# Patient Record
Sex: Female | Born: 1945
Health system: Southern US, Community
[De-identification: ages and names within clinical notes are randomized; demographics above are authoritative.]

## PROBLEM LIST (undated history)

## (undated) DIAGNOSIS — I872 Venous insufficiency (chronic) (peripheral): Secondary | ICD-10-CM

## (undated) DIAGNOSIS — I8390 Asymptomatic varicose veins of unspecified lower extremity: Secondary | ICD-10-CM

## (undated) DIAGNOSIS — J449 Chronic obstructive pulmonary disease, unspecified: Secondary | ICD-10-CM

## (undated) DIAGNOSIS — K219 Gastro-esophageal reflux disease without esophagitis: Secondary | ICD-10-CM

## (undated) DIAGNOSIS — G40909 Epilepsy, unspecified, not intractable, without status epilepticus: Secondary | ICD-10-CM

## (undated) DIAGNOSIS — M1990 Unspecified osteoarthritis, unspecified site: Secondary | ICD-10-CM

## (undated) DIAGNOSIS — G4730 Sleep apnea, unspecified: Secondary | ICD-10-CM

## (undated) DIAGNOSIS — E785 Hyperlipidemia, unspecified: Secondary | ICD-10-CM

## (undated) DIAGNOSIS — K649 Unspecified hemorrhoids: Secondary | ICD-10-CM

## (undated) DIAGNOSIS — G8929 Other chronic pain: Secondary | ICD-10-CM

## (undated) DIAGNOSIS — R519 Headache, unspecified: Secondary | ICD-10-CM

## (undated) DIAGNOSIS — I509 Heart failure, unspecified: Secondary | ICD-10-CM

## (undated) DIAGNOSIS — R0600 Dyspnea, unspecified: Secondary | ICD-10-CM

## (undated) DIAGNOSIS — R7302 Impaired glucose tolerance (oral): Secondary | ICD-10-CM

## (undated) DIAGNOSIS — J45909 Unspecified asthma, uncomplicated: Secondary | ICD-10-CM

## (undated) DIAGNOSIS — R51 Headache: Secondary | ICD-10-CM

## (undated) DIAGNOSIS — E039 Hypothyroidism, unspecified: Secondary | ICD-10-CM

## (undated) DIAGNOSIS — R569 Unspecified convulsions: Secondary | ICD-10-CM

## (undated) HISTORY — DX: Impaired glucose tolerance (oral): R73.02

## (undated) HISTORY — DX: Venous insufficiency (chronic) (peripheral): I87.2

## (undated) HISTORY — DX: Other chronic pain: G89.29

## (undated) HISTORY — DX: Headache: R51

## (undated) HISTORY — DX: Unspecified convulsions: R56.9

## (undated) HISTORY — DX: Gastro-esophageal reflux disease without esophagitis: K21.9

## (undated) HISTORY — PX: OTHER SURGICAL HISTORY: SHX169

## (undated) HISTORY — DX: Unspecified hemorrhoids: K64.9

## (undated) HISTORY — PX: TUBAL LIGATION: SHX77

## (undated) HISTORY — DX: Headache, unspecified: R51.9

## (undated) HISTORY — DX: Hyperlipidemia, unspecified: E78.5

## (undated) HISTORY — DX: Epilepsy, unspecified, not intractable, without status epilepticus: G40.909

## (undated) HISTORY — PX: KNEE ARTHROSCOPY: SHX127

## (undated) HISTORY — PX: TONSILLECTOMY: SUR1361

## (undated) HISTORY — DX: Asymptomatic varicose veins of unspecified lower extremity: I83.90

---

## 2000-11-01 ENCOUNTER — Ambulatory Visit (HOSPITAL_COMMUNITY): Admission: RE | Admit: 2000-11-01 | Discharge: 2000-11-01 | Payer: Self-pay | Admitting: Family Medicine

## 2000-11-01 ENCOUNTER — Encounter: Payer: Self-pay | Admitting: Family Medicine

## 2001-10-22 ENCOUNTER — Encounter: Payer: Self-pay | Admitting: Internal Medicine

## 2001-10-22 ENCOUNTER — Ambulatory Visit (HOSPITAL_COMMUNITY): Admission: RE | Admit: 2001-10-22 | Discharge: 2001-10-22 | Payer: Self-pay | Admitting: Internal Medicine

## 2007-03-01 ENCOUNTER — Ambulatory Visit: Payer: Self-pay | Admitting: Cardiology

## 2007-03-04 ENCOUNTER — Encounter: Payer: Self-pay | Admitting: Cardiology

## 2008-08-23 ENCOUNTER — Encounter: Payer: Self-pay | Admitting: Cardiology

## 2008-08-27 ENCOUNTER — Encounter: Payer: Self-pay | Admitting: Cardiology

## 2008-10-23 ENCOUNTER — Encounter: Payer: Self-pay | Admitting: Cardiology

## 2008-12-17 ENCOUNTER — Encounter: Payer: Self-pay | Admitting: Cardiology

## 2008-12-25 ENCOUNTER — Encounter: Payer: Self-pay | Admitting: Cardiology

## 2009-01-06 ENCOUNTER — Encounter (INDEPENDENT_AMBULATORY_CARE_PROVIDER_SITE_OTHER): Payer: Self-pay | Admitting: *Deleted

## 2009-01-06 ENCOUNTER — Ambulatory Visit: Payer: Self-pay | Admitting: Cardiology

## 2009-01-06 DIAGNOSIS — G4710 Hypersomnia, unspecified: Secondary | ICD-10-CM | POA: Insufficient documentation

## 2009-01-06 DIAGNOSIS — R569 Unspecified convulsions: Secondary | ICD-10-CM

## 2009-01-06 DIAGNOSIS — I10 Essential (primary) hypertension: Secondary | ICD-10-CM | POA: Insufficient documentation

## 2009-01-06 DIAGNOSIS — F411 Generalized anxiety disorder: Secondary | ICD-10-CM | POA: Insufficient documentation

## 2009-01-06 DIAGNOSIS — R0789 Other chest pain: Secondary | ICD-10-CM | POA: Insufficient documentation

## 2009-01-06 DIAGNOSIS — E663 Overweight: Secondary | ICD-10-CM | POA: Insufficient documentation

## 2009-01-06 DIAGNOSIS — G4730 Sleep apnea, unspecified: Secondary | ICD-10-CM | POA: Insufficient documentation

## 2009-01-06 DIAGNOSIS — K219 Gastro-esophageal reflux disease without esophagitis: Secondary | ICD-10-CM

## 2009-01-26 ENCOUNTER — Telehealth (INDEPENDENT_AMBULATORY_CARE_PROVIDER_SITE_OTHER): Payer: Self-pay | Admitting: *Deleted

## 2009-02-01 ENCOUNTER — Ambulatory Visit: Payer: Self-pay | Admitting: Cardiology

## 2009-02-01 ENCOUNTER — Encounter: Payer: Self-pay | Admitting: Cardiology

## 2009-02-09 ENCOUNTER — Encounter (INDEPENDENT_AMBULATORY_CARE_PROVIDER_SITE_OTHER): Payer: Self-pay | Admitting: *Deleted

## 2010-09-07 ENCOUNTER — Encounter: Payer: Self-pay | Admitting: Cardiology

## 2015-02-12 ENCOUNTER — Ambulatory Visit (INDEPENDENT_AMBULATORY_CARE_PROVIDER_SITE_OTHER): Payer: Medicare Other | Admitting: Obstetrics and Gynecology

## 2015-02-12 ENCOUNTER — Encounter: Payer: Self-pay | Admitting: Obstetrics and Gynecology

## 2015-02-12 VITALS — BP 140/78 | Ht 65.0 in | Wt 269.0 lb

## 2015-02-12 DIAGNOSIS — N764 Abscess of vulva: Secondary | ICD-10-CM | POA: Insufficient documentation

## 2015-02-12 NOTE — Progress Notes (Signed)
Patient ID: Isabella Holmes, female   DOB: [birth year], 69 y.o.   MRN: [id number] Pt worked in today for bartholin's cyst. Pt states that she has had the problem for about 2 days.

## 2015-02-12 NOTE — Progress Notes (Addendum)
Patient ID: OFILIA WOLFINGER, female   DOB: 1945/07/30, 69 y.o.   MRN: [id number]   [facility] ObGyn Clinic Visit  Patient name: MARYKE KO MRN [id number]  Date of birth: 07/02/1945  CC & HPI:  JESLIN KENTER is a 69 y.o. female presenting today for an abscess in the left gluteal crease with associated  burning pain onset couple of days ago. Pt reports an allergy to penicillin and sulfa.   ROS:  10 Systems reviewed and all are negative for acute change except as noted in the HPI.no hx diabetes, or poor healing. Has had folliculitis in past Pertinent History Reviewed:   Reviewed: Significant for status post bilateral tubaligation.  Medical         Past Medical History  Diagnosis Date  . Seizure disorder (HCC)   . Chest pain     w abnormal ECg  . Venous insufficiency   . Dyslipidemia   . Glucose intolerance (impaired glucose tolerance)   . GERD (gastroesophageal reflux disease)   . Chronic headaches     migraine                              Surgical Hx:    Past Surgical History  Procedure Laterality Date  . Tonsillectomy      many years ago  . Left vein stripping    . Status post bilateral tubaligation     Medications: Reviewed & Updated - see associated section                       Current outpatient prescriptions:  .  aspirin 81 MG tablet, Take 81 mg by mouth daily.  , Disp: , Rfl:  .  ergocalciferol (VITAMIN D2) 50000 UNITS capsule, Take 50,000 Units by mouth once a week.  , Disp: , Rfl:  .  fexofenadine (ALLEGRA) 180 MG tablet, Take 180 mg by mouth daily.  , Disp: , Rfl:  .  lamoTRIgine (LAMICTAL) 150 MG tablet, Take 150 mg by mouth daily.  , Disp: , Rfl:  .  levothyroxine (SYNTHROID, LEVOTHROID) 50 MCG tablet, Take 50 mcg by mouth daily., Disp: , Rfl: 0 .  Omega-3 Fatty Acids (FISH OIL) 1000 MG CAPS, Take by mouth daily.  , Disp: , Rfl:  .  nitroGLYCERIN (NITROSTAT) 0.4 MG SL tablet, Place 0.4 mg under the tongue every 5 (five) minutes as needed.  , Disp: , Rfl:     Social History: Reviewed -  reports that she has quit smoking. She has never used smokeless tobacco.  Objective Findings:  Vitals: Blood pressure 140/78, height 5\' 5"  (1.651 m), weight 269 lb (122.018 kg).  Physical Examination: General appearance - alert, well appearing, and in no distress and oriented to person, place, and time Mental status - alert, oriented to person, place, and time, normal mood, behavior, speech, dress, motor activity, and thought processes Pelvic - normal external genitalia, vulva, vagina, cervix, uterus and adnexa,  VULVA: normal appearing vulva with no masses, tenderness or lesions,  VAGINA: normal appearing vagina with normal color and discharge, Hard nodule in left gluteal crease directly lateral to the vagina, approximately 1.5cm in diameter.    Assessment & Plan:   A:  1. vulvar folliculitis, left gluteal crease   P:  1. Antibiotics (doxycycline 14 days).  2. Heating Pad use.  3. Return if no improvement within one week for possible lancing.   By  signing my name below, I, Marica Otter, attest that this documentation has been prepared under the direction and in the presence of Christin Bach, MD. Electronically Signed: Marica Otter, ED Scribe. 02/12/2015. 1:34 PM.  I personally performed the services described in this documentation, which was SCRIBED in my presence. The recorded information has been reviewed and considered accurate. It has been edited as necessary during review. Tilda Burrow, MD

## 2015-02-16 ENCOUNTER — Telehealth: Payer: Self-pay | Admitting: Obstetrics and Gynecology

## 2015-02-16 NOTE — Telephone Encounter (Signed)
Spoke with Dr. Glo Herring and he advised for the Isabella Holmes to continue taking the antibiotic. If Isabella Holmes's thigh or groin area is swelling and hurting this is normal.

## 2015-02-24 ENCOUNTER — Other Ambulatory Visit: Payer: Medicare Other | Admitting: Adult Health

## 2015-03-01 ENCOUNTER — Ambulatory Visit: Payer: Medicare Other | Admitting: Obstetrics and Gynecology

## 2015-03-03 ENCOUNTER — Encounter: Payer: Self-pay | Admitting: Obstetrics and Gynecology

## 2015-03-03 ENCOUNTER — Other Ambulatory Visit: Payer: Self-pay | Admitting: Obstetrics and Gynecology

## 2015-03-03 ENCOUNTER — Ambulatory Visit (INDEPENDENT_AMBULATORY_CARE_PROVIDER_SITE_OTHER): Payer: Medicare Other | Admitting: Obstetrics and Gynecology

## 2015-03-03 VITALS — BP 146/80 | Ht 66.0 in | Wt 269.0 lb

## 2015-03-03 DIAGNOSIS — N764 Abscess of vulva: Secondary | ICD-10-CM

## 2015-03-03 NOTE — Progress Notes (Signed)
Patient ID: Isabella Holmes, female   DOB: 01/23/1946, 69 y.o.   MRN: [id number] Pt here today for follow up. Pt states that the area is better but still there. Pt denies any pain at this time.

## 2015-03-03 NOTE — Progress Notes (Signed)
Patient ID: Isabella Holmes, female   DOB: 01/05/1946, 69 y.o.   MRN: [id number]    [facility] ObGyn Clinic Visit  Patient name: Isabella Holmes MRN [id number]  Date of birth: 07-Jun-1945  CC & HPI:  Isabella Holmes is a 69 y.o. female presenting today for follow up of vulvar folliculitis. Pt reports healing well after 14 days of doxycycline and warm compress with minimal tenderness.   ROS:  A complete 10 system review of systems was obtained and all systems are negative except as noted in the HPI and PMH.    Pertinent History Reviewed:   Reviewed: Significant for vulvar folliculitis  Medical         Past Medical History  Diagnosis Date  . Seizure disorder (HCC)   . Chest pain     w abnormal ECg  . Venous insufficiency   . Dyslipidemia   . Glucose intolerance (impaired glucose tolerance)   . GERD (gastroesophageal reflux disease)   . Chronic headaches     migraine                              Surgical Hx:    Past Surgical History  Procedure Laterality Date  . Tonsillectomy      many years ago  . Left vein stripping    . Status post bilateral tubaligation     Medications: Reviewed & Updated - see associated section                       Current outpatient prescriptions:  .  aspirin 81 MG tablet, Take 81 mg by mouth daily.  , Disp: , Rfl:  .  ergocalciferol (VITAMIN D2) 50000 UNITS capsule, Take 50,000 Units by mouth once a week.  , Disp: , Rfl:  .  fexofenadine (ALLEGRA) 180 MG tablet, Take 180 mg by mouth daily.  , Disp: , Rfl:  .  lamoTRIgine (LAMICTAL) 150 MG tablet, Take 150 mg by mouth daily.  , Disp: , Rfl:  .  levothyroxine (SYNTHROID, LEVOTHROID) 50 MCG tablet, Take 50 mcg by mouth daily., Disp: , Rfl: 0 .  nitroGLYCERIN (NITROSTAT) 0.4 MG SL tablet, Place 0.4 mg under the tongue every 5 (five) minutes as needed.  , Disp: , Rfl:  .  Omega-3 Fatty Acids (FISH OIL) 1000 MG CAPS, Take by mouth daily.  , Disp: , Rfl:    Social History: Reviewed -  reports that she has  quit smoking. She has never used smokeless tobacco.  Objective Findings:  Vitals: Blood pressure 146/80, height 5\' 6"  (1.676 m), weight 269 lb (122.018 kg).  Physical Examination: General appearance - alert, well appearing, and in no distress Mental status - alert, oriented to person, place, and time Pelvic - normal external genitalia, vulva, vagina, cervix, uterus and adnexa, Left inguinal crease: she had a previous folliculitis that was indurated. It is now reduced to 1x1 cm; mobile; no redness or tenderness.    Assessment & Plan:   A:  1. Well healing folliculitis with no erythema or tenderness.  2. Reduced to 1x1 cm from 2cm.   P:  1. Follow up prn or with worsening symptoms.      By signing my name below, I, Doreatha Martin, attest that this documentation has been prepared under the direction and in the presence of Tilda Burrow, MD. Electronically Signed: Doreatha Martin, ED Scribe. 03/03/2015. 9:39 AM.  I  personally performed the services described in this documentation, which was SCRIBED in my presence. The recorded information has been reviewed and considered accurate. It has been edited as necessary during review. Tilda Burrow, MD

## 2015-03-25 ENCOUNTER — Ambulatory Visit (INDEPENDENT_AMBULATORY_CARE_PROVIDER_SITE_OTHER): Payer: Medicare Other | Admitting: Adult Health

## 2015-03-25 ENCOUNTER — Other Ambulatory Visit (HOSPITAL_COMMUNITY)
Admission: RE | Admit: 2015-03-25 | Discharge: 2015-03-25 | Disposition: A | Discharge status: Home / Self Care | Payer: Medicare Other | Source: Ambulatory Visit | Attending: Adult Health | Admitting: Adult Health

## 2015-03-25 ENCOUNTER — Encounter: Payer: Self-pay | Admitting: Adult Health

## 2015-03-25 VITALS — BP 140/78 | HR 72 | Ht 64.0 in | Wt 270.0 lb

## 2015-03-25 DIAGNOSIS — Z01419 Encounter for gynecological examination (general) (routine) without abnormal findings: Secondary | ICD-10-CM

## 2015-03-25 DIAGNOSIS — Z1212 Encounter for screening for malignant neoplasm of rectum: Secondary | ICD-10-CM | POA: Diagnosis not present

## 2015-03-25 DIAGNOSIS — I8390 Asymptomatic varicose veins of unspecified lower extremity: Secondary | ICD-10-CM

## 2015-03-25 DIAGNOSIS — Z1151 Encounter for screening for human papillomavirus (HPV): Secondary | ICD-10-CM | POA: Insufficient documentation

## 2015-03-25 DIAGNOSIS — K649 Unspecified hemorrhoids: Secondary | ICD-10-CM | POA: Insufficient documentation

## 2015-03-25 HISTORY — DX: Asymptomatic varicose veins of unspecified lower extremity: I83.90

## 2015-03-25 HISTORY — DX: Unspecified hemorrhoids: K64.9

## 2015-03-25 LAB — HEMOCCULT GUIAC POC 1CARD (OFFICE): FECAL OCCULT BLD: NEGATIVE

## 2015-03-25 NOTE — Progress Notes (Signed)
Patient ID: MARYHELEN MCELENEY, female   DOB: April 02, 1945, 70 y.o.   MRN: [id number] History of Present Illness: Oliana is a 70 year old white female, married in for a well woman gyn exam and pap.She has swelling on left leg at times up to groin, she has had veins stripped in that leg in past. Declines flu shot. PCP is Dr Zada Girt.  Current Medications, Allergies, Past Medical History, Past Surgical History, Family History and Social History were reviewed in [employer] record.     Review of Systems: Patient denies any headaches, hearing loss, fatigue, blurred vision, shortness of breath, chest pain, abdominal pain, problems with bowel movements, urination, or intercourse(not having sex). No joint pain or mood swings.    Physical Exam:BP 140/78 mmHg  Pulse 72  Ht 5\' 4"  (1.626 m)  Wt 270 lb (122.471 kg)  BMI 46.32 kg/m2 General:  Well developed, well nourished, no acute distress Skin:  Warm and dry Neck:  Midline trachea, normal thyroid, good ROM, no lymphadenopathy, no carotids heard Lungs; Clear to auscultation bilaterally Breast:  No dominant palpable mass, retraction, or nipple discharge Cardiovascular: Regular rate and rhythm Abdomen:  Soft, non tender, no hepatosplenomegaly,obese Pelvic:  External genitalia is normal in appearance, no lesions.  The vagina has decreased color, moisture and rugae. Urethra has no lesions or masses. The cervix is smooth, pap with HPV performed.  Uterus is felt to be normal size, shape, and contour.  No adnexal masses or tenderness noted.Bladder is non tender, no masses felt, difficult secondary to abdominal girth  Rectal: Good sphincter tone, no polyps, +internal and external  hemorrhoids felt.  Hemoccult negative. Extremities/musculoskeletal:  No swelling, today, has + varicosities noted in both legs, L>R, no clubbing or cyanosis, color, normal and temperature normal, no hot spots or redness, discussed probable insuffiencey  Psych:  No  mood changes, alert and cooperative,seems happy   Impression: Well woman gyn exam and pap Varicose veins Hemorrhoids     Plan: Physical in 2 years. Pap in 3 if normal Mammogram yearly Labs with PCP Has F/U with PCP end of month, talk with him about getting doppler left leg to assess veins Try analpram HC for hemorrhoids, 3 samples given, if works, can [employer]

## 2015-03-25 NOTE — Patient Instructions (Addendum)
Physical in 2 years Mammogram yearly Follow up with PCP about leg  Labs with PCP

## 2015-03-30 LAB — CYTOLOGY - PAP

## 2015-04-02 DIAGNOSIS — G40909 Epilepsy, unspecified, not intractable, without status epilepticus: Secondary | ICD-10-CM | POA: Diagnosis not present

## 2015-04-02 DIAGNOSIS — Z6841 Body Mass Index (BMI) 40.0 and over, adult: Secondary | ICD-10-CM | POA: Diagnosis not present

## 2015-04-02 DIAGNOSIS — G4733 Obstructive sleep apnea (adult) (pediatric): Secondary | ICD-10-CM | POA: Diagnosis not present

## 2015-04-02 DIAGNOSIS — Z0000 Encounter for general adult medical examination without abnormal findings: Secondary | ICD-10-CM | POA: Diagnosis not present

## 2015-04-02 DIAGNOSIS — E038 Other specified hypothyroidism: Secondary | ICD-10-CM | POA: Diagnosis not present

## 2015-04-02 DIAGNOSIS — J4520 Mild intermittent asthma, uncomplicated: Secondary | ICD-10-CM | POA: Diagnosis not present

## 2015-04-02 DIAGNOSIS — I10 Essential (primary) hypertension: Secondary | ICD-10-CM | POA: Diagnosis not present

## 2015-04-12 DIAGNOSIS — H1851 Endothelial corneal dystrophy: Secondary | ICD-10-CM | POA: Diagnosis not present

## 2015-04-12 DIAGNOSIS — H52223 Regular astigmatism, bilateral: Secondary | ICD-10-CM | POA: Diagnosis not present

## 2015-04-12 DIAGNOSIS — H2513 Age-related nuclear cataract, bilateral: Secondary | ICD-10-CM | POA: Diagnosis not present

## 2015-04-12 DIAGNOSIS — H5203 Hypermetropia, bilateral: Secondary | ICD-10-CM | POA: Diagnosis not present

## 2015-04-15 DIAGNOSIS — G4733 Obstructive sleep apnea (adult) (pediatric): Secondary | ICD-10-CM | POA: Diagnosis not present

## 2015-05-13 DIAGNOSIS — G4733 Obstructive sleep apnea (adult) (pediatric): Secondary | ICD-10-CM | POA: Diagnosis not present

## 2015-06-08 DIAGNOSIS — G4733 Obstructive sleep apnea (adult) (pediatric): Secondary | ICD-10-CM | POA: Diagnosis not present

## 2015-06-10 DIAGNOSIS — G4733 Obstructive sleep apnea (adult) (pediatric): Secondary | ICD-10-CM | POA: Diagnosis not present

## 2015-06-13 DIAGNOSIS — G4733 Obstructive sleep apnea (adult) (pediatric): Secondary | ICD-10-CM | POA: Diagnosis not present

## 2015-07-02 DIAGNOSIS — E038 Other specified hypothyroidism: Secondary | ICD-10-CM | POA: Diagnosis not present

## 2015-07-02 DIAGNOSIS — Z0000 Encounter for general adult medical examination without abnormal findings: Secondary | ICD-10-CM | POA: Diagnosis not present

## 2015-07-02 DIAGNOSIS — I10 Essential (primary) hypertension: Secondary | ICD-10-CM | POA: Diagnosis not present

## 2015-07-10 DIAGNOSIS — G4733 Obstructive sleep apnea (adult) (pediatric): Secondary | ICD-10-CM | POA: Diagnosis not present

## 2015-07-13 DIAGNOSIS — G4733 Obstructive sleep apnea (adult) (pediatric): Secondary | ICD-10-CM | POA: Diagnosis not present

## 2015-08-10 DIAGNOSIS — G4733 Obstructive sleep apnea (adult) (pediatric): Secondary | ICD-10-CM | POA: Diagnosis not present

## 2015-08-13 DIAGNOSIS — G4733 Obstructive sleep apnea (adult) (pediatric): Secondary | ICD-10-CM | POA: Diagnosis not present

## 2015-09-10 DIAGNOSIS — G4733 Obstructive sleep apnea (adult) (pediatric): Secondary | ICD-10-CM | POA: Diagnosis not present

## 2015-09-12 DIAGNOSIS — G4733 Obstructive sleep apnea (adult) (pediatric): Secondary | ICD-10-CM | POA: Diagnosis not present

## 2015-10-11 DIAGNOSIS — G4733 Obstructive sleep apnea (adult) (pediatric): Secondary | ICD-10-CM | POA: Diagnosis not present

## 2015-10-13 DIAGNOSIS — G4733 Obstructive sleep apnea (adult) (pediatric): Secondary | ICD-10-CM | POA: Diagnosis not present

## 2015-11-09 DIAGNOSIS — M1611 Unilateral primary osteoarthritis, right hip: Secondary | ICD-10-CM | POA: Diagnosis not present

## 2015-11-09 DIAGNOSIS — M160 Bilateral primary osteoarthritis of hip: Secondary | ICD-10-CM | POA: Diagnosis not present

## 2015-11-09 DIAGNOSIS — E038 Other specified hypothyroidism: Secondary | ICD-10-CM | POA: Diagnosis not present

## 2015-11-09 DIAGNOSIS — I10 Essential (primary) hypertension: Secondary | ICD-10-CM | POA: Diagnosis not present

## 2015-11-10 DIAGNOSIS — G4733 Obstructive sleep apnea (adult) (pediatric): Secondary | ICD-10-CM | POA: Diagnosis not present

## 2015-11-13 DIAGNOSIS — G4733 Obstructive sleep apnea (adult) (pediatric): Secondary | ICD-10-CM | POA: Diagnosis not present

## 2015-12-11 DIAGNOSIS — G4733 Obstructive sleep apnea (adult) (pediatric): Secondary | ICD-10-CM | POA: Diagnosis not present

## 2015-12-13 DIAGNOSIS — G4733 Obstructive sleep apnea (adult) (pediatric): Secondary | ICD-10-CM | POA: Diagnosis not present

## 2016-01-05 DIAGNOSIS — G4733 Obstructive sleep apnea (adult) (pediatric): Secondary | ICD-10-CM | POA: Diagnosis not present

## 2016-01-05 DIAGNOSIS — Z833 Family history of diabetes mellitus: Secondary | ICD-10-CM | POA: Diagnosis not present

## 2016-01-05 DIAGNOSIS — Z13220 Encounter for screening for lipoid disorders: Secondary | ICD-10-CM | POA: Diagnosis not present

## 2016-01-05 DIAGNOSIS — E039 Hypothyroidism, unspecified: Secondary | ICD-10-CM | POA: Diagnosis not present

## 2016-01-05 DIAGNOSIS — Z131 Encounter for screening for diabetes mellitus: Secondary | ICD-10-CM | POA: Diagnosis not present

## 2016-01-05 DIAGNOSIS — Z23 Encounter for immunization: Secondary | ICD-10-CM | POA: Diagnosis not present

## 2016-01-11 DIAGNOSIS — G4733 Obstructive sleep apnea (adult) (pediatric): Secondary | ICD-10-CM | POA: Diagnosis not present

## 2016-01-13 DIAGNOSIS — G4733 Obstructive sleep apnea (adult) (pediatric): Secondary | ICD-10-CM | POA: Diagnosis not present

## 2016-01-17 DIAGNOSIS — Z1231 Encounter for screening mammogram for malignant neoplasm of breast: Secondary | ICD-10-CM | POA: Diagnosis not present

## 2016-02-10 ENCOUNTER — Telehealth: Payer: Self-pay | Admitting: Obstetrics and Gynecology

## 2016-02-10 DIAGNOSIS — G4733 Obstructive sleep apnea (adult) (pediatric): Secondary | ICD-10-CM | POA: Diagnosis not present

## 2016-02-10 MED ORDER — DOXYCYCLINE HYCLATE 100 MG PO TABS: 100.0000 mg | ORAL_TABLET | Freq: Two times a day (BID) | ORAL | 0 refills | Status: DC

## 2016-02-10 NOTE — Telephone Encounter (Signed)
Doxycycline e-scribed, if no improvement with ABX pt will need to make an appt.

## 2016-02-12 DIAGNOSIS — G4733 Obstructive sleep apnea (adult) (pediatric): Secondary | ICD-10-CM | POA: Diagnosis not present

## 2016-02-29 DIAGNOSIS — E039 Hypothyroidism, unspecified: Secondary | ICD-10-CM | POA: Diagnosis not present

## 2016-02-29 DIAGNOSIS — G4733 Obstructive sleep apnea (adult) (pediatric): Secondary | ICD-10-CM | POA: Diagnosis not present

## 2016-03-14 DIAGNOSIS — G4733 Obstructive sleep apnea (adult) (pediatric): Secondary | ICD-10-CM | POA: Diagnosis not present

## 2016-04-10 DIAGNOSIS — J4520 Mild intermittent asthma, uncomplicated: Secondary | ICD-10-CM | POA: Diagnosis not present

## 2016-04-10 DIAGNOSIS — J301 Allergic rhinitis due to pollen: Secondary | ICD-10-CM | POA: Diagnosis not present

## 2016-04-10 DIAGNOSIS — Z87891 Personal history of nicotine dependence: Secondary | ICD-10-CM | POA: Diagnosis not present

## 2016-05-10 DIAGNOSIS — G4733 Obstructive sleep apnea (adult) (pediatric): Secondary | ICD-10-CM | POA: Diagnosis not present

## 2016-06-03 ENCOUNTER — Encounter (HOSPITAL_COMMUNITY): Payer: Self-pay | Admitting: Emergency Medicine

## 2016-06-03 ENCOUNTER — Emergency Department (HOSPITAL_COMMUNITY)
Admission: EM | Admit: 2016-06-03 | Discharge: 2016-06-03 | Disposition: A | Discharge status: Home / Self Care | Payer: Medicare Other | Attending: Emergency Medicine | Admitting: Emergency Medicine

## 2016-06-03 ENCOUNTER — Emergency Department (HOSPITAL_COMMUNITY): Payer: Medicare Other

## 2016-06-03 DIAGNOSIS — S8991XA Unspecified injury of right lower leg, initial encounter: Secondary | ICD-10-CM | POA: Diagnosis present

## 2016-06-03 DIAGNOSIS — Y9301 Activity, walking, marching and hiking: Secondary | ICD-10-CM | POA: Diagnosis not present

## 2016-06-03 DIAGNOSIS — I10 Essential (primary) hypertension: Secondary | ICD-10-CM | POA: Diagnosis not present

## 2016-06-03 DIAGNOSIS — Z79899 Other long term (current) drug therapy: Secondary | ICD-10-CM | POA: Diagnosis not present

## 2016-06-03 DIAGNOSIS — Y999 Unspecified external cause status: Secondary | ICD-10-CM | POA: Diagnosis not present

## 2016-06-03 DIAGNOSIS — Z7982 Long term (current) use of aspirin: Secondary | ICD-10-CM | POA: Insufficient documentation

## 2016-06-03 DIAGNOSIS — Z87891 Personal history of nicotine dependence: Secondary | ICD-10-CM | POA: Insufficient documentation

## 2016-06-03 DIAGNOSIS — M25561 Pain in right knee: Secondary | ICD-10-CM

## 2016-06-03 DIAGNOSIS — M25461 Effusion, right knee: Secondary | ICD-10-CM | POA: Insufficient documentation

## 2016-06-03 DIAGNOSIS — W1839XA Other fall on same level, initial encounter: Secondary | ICD-10-CM | POA: Insufficient documentation

## 2016-06-03 DIAGNOSIS — Y92524 Gas station as the place of occurrence of the external cause: Secondary | ICD-10-CM | POA: Insufficient documentation

## 2016-06-03 MED ORDER — IBUPROFEN 600 MG PO TABS
600.0000 mg | ORAL_TABLET | Freq: Four times a day (QID) | ORAL | 0 refills | Status: AC | PRN
Start: 2016-06-03 — End: 2016-06-10

## 2016-06-03 NOTE — Discharge Instructions (Signed)
Your workup was reassuring today that you do not have a bony injury or dislocation to your knee.  We recommend that you use the provided Ace wrap and crutches as needed, but you can continue to bear weight as tolerated.  Read through the included information about routine care for injuries.  Follow up as recommended if you are still having problems in about a week.   

## 2016-06-03 NOTE — ED Notes (Signed)
Pt demonstrated use of crutches

## 2016-06-03 NOTE — ED Provider Notes (Signed)
Emergency Department Provider Note  By signing my name below, I, Isabella Holmes, attest that this documentation has been prepared under the direction and in the presence of Isabella Fast, MD . Electronically Signed: Higinio Holmes, Scribe. 06/03/2016. 8:00 PM.  I have reviewed the triage vital signs and the nursing notes.  HISTORY  Chief Complaint Knee Pain  HPI Comments: Isabella Holmes is a 71 y.o. female with PMHx of seizures, who presents to the Emergency Department complaining of gradually worsening, "sharp," right knee pain s/p an injury that occurred ~2 days ago. Pt reports she was walking out of a gas station 2 days ago when she suddenly "caught" her right knee while attempting to step down from the curb. She states her current pain is exacerbated with movement. She notes hx of a meniscus injury ~5 years ago and reports she has a follow-up appointment with her orthopedic speacilist on 06/05/16 but visited the ED today for possible imaging. Pt denies any other complaints.   Past Medical History:  Diagnosis Date  . Chest pain    w abnormal ECg  . Chronic headaches    migraine  . Dyslipidemia   . GERD (gastroesophageal reflux disease)   . Glucose intolerance (impaired glucose tolerance)   . Hemorrhoids 03/25/2015  . Seizure disorder ([place])   . Seizures ([place])   . Varicose veins 03/25/2015  . Venous insufficiency     Patient Active Problem List   Diagnosis Date Noted  . Varicose veins 03/25/2015  . Hemorrhoids 03/25/2015  . Vulvar abscess 02/12/2015  . OVERWEIGHT 01/06/2009  . ANXIETY STATE, UNSPECIFIED 01/06/2009  . HYPERTENSION 01/06/2009  . ESOPHAGEAL REFLUX 01/06/2009  . SEIZURE DISORDER 01/06/2009  . HYPERSOMNIA WITH SLEEP APNEA UNSPECIFIED 01/06/2009  . UNSPECIFIED SLEEP APNEA 01/06/2009  . OTHER CHEST PAIN 01/06/2009    Past Surgical History:  Procedure Laterality Date  . left vein stripping    . status post bilateral tubaligation    . TONSILLECTOMY     many years  ago    Current Outpatient Rx  . Order #: [id number] Class: Historical Med  . Order #: [id number] Class: Historical Med  . Order #: [id number] Class: Historical Med  . Order #: [id number] Class: Historical Med  . Order #: [id number] Class: Historical Med  . Order #: [id number] Class: Print    Allergies Penicillins; Streptomycin; and Sulfonamide derivatives  Family History  Problem Relation Age of Onset  . Heart attack Mother   . Tuberculosis Father   . Heart disease Father     cardiac disease  . Cirrhosis Father   . Factor V Leiden deficiency Other   . Seizures Son     Social History Social History  Substance Use Topics  . Smoking status: Former Smoker    Types: Cigarettes  . Smokeless tobacco: Never Used  . Alcohol use No    Review of Systems  10-point ROS otherwise negative.  ____________________________________________   PHYSICAL EXAM:  VITAL SIGNS: ED Triage Vitals  Enc Vitals Group     BP 06/03/16 1046 (!) 143/78     Pulse Rate 06/03/16 1046 61     Resp 06/03/16 1046 18     Temp 06/03/16 1046 97.4 F (36.3 C)     Temp Source 06/03/16 1046 Oral     SpO2 06/03/16 1046 96 %     Weight 06/03/16 1044 234 lb (106.1 kg)     Height 06/03/16 1044 5\' 6"  (1.676 m)     Pain Score 06/03/16 1044 8  Constitutional: Alert and oriented. Well appearing and in no acute distress. Eyes: Conjunctivae are normal.  Head: Atraumatic. Nose: No congestion/rhinnorhea. Mouth/Throat: Mucous membranes are moist.  Oropharynx non-erythematous. Neck: No stridor.  Cardiovascular: Normal rate, regular rhythm. Good peripheral circulation. Grossly normal heart sounds.   Respiratory: Normal respiratory effort.  No retractions. Lungs CTAB. Gastrointestinal: Soft and nontender. No distention.  Musculoskeletal: No lower extremity tenderness nor edema. No gross deformities of extremities. Mild medial knee tenderness with mild effusion. No redness or warmth.  Neurologic:  Normal speech and language. No  gross focal neurologic deficits are appreciated.  Skin:  Skin is warm, dry and intact. No rash noted. Psychiatric: Mood and affect are normal. Speech and behavior are normal.  ____________________________________________  RADIOLOGY  Dg Knee Complete 4 Views Right  Result Date: 06/03/2016 CLINICAL DATA:  Right knee pain after recent fall EXAM: RIGHT KNEE - COMPLETE 4+ VIEW COMPARISON:  None. FINDINGS: Trace suprapatellar right knee joint effusion. No fracture or dislocation. No suspicious focal osseous lesion. Mild-to-moderate tricompartmental osteoarthritis, most prominent in the medial compartment. Small to moderate superior and inferior right patellar enthesophytes. No radiopaque foreign body. IMPRESSION: 1. Trace suprapatellar right knee joint effusion. No fracture or dislocation. 2. Mild-to-moderate tricompartmental osteoarthritis in the right knee, most prominent in the medial compartment. Electronically Signed   By: Ilona Sorrel M.D.   On: 06/03/2016 11:11  ____________________________________________   PROCEDURES  Procedure(s) performed:   Procedures ____________________________________________   INITIAL IMPRESSION / ASSESSMENT AND Holmes / ED COURSE  Pertinent labs & imaging results that were available during my care of the patient were reviewed by me and considered in my medical decision making (see chart for details).  Patient presents to the ED with knee pain for the last two days  After minor injury. Mild swelling medially. No large effusion or evidence of septic joint. Patient has orthopedic follow up scheduled on Monday. Holmes for RICE and NSAIDs. Provided crutches and return precautions.   At this time, I do not feel there is any life-threatening condition present. I have reviewed and discussed all results (EKG, imaging, lab, urine as appropriate), exam findings with patient. I have reviewed nursing notes and appropriate previous records.  I feel the patient is safe to be  discharged home without further emergent workup. Discussed usual and customary return precautions. Patient and family (if present) verbalize understanding and are comfortable with this Holmes.  Patient will follow-up with their primary care provider. If they do not have a primary care provider, information for follow-up has been provided to them. All questions have been answered.  ____________________________________________  FINAL CLINICAL IMPRESSION(S) / ED DIAGNOSES  Final diagnoses:  Acute pain of right knee    MEDICATIONS GIVEN DURING THIS VISIT:  None  NEW OUTPATIENT MEDICATIONS STARTED DURING THIS VISIT:  Discharge Medication List as of 06/03/2016 11:43 AM    START taking these medications   Details  ibuprofen (ADVIL,MOTRIN) 600 MG tablet Take 1 tablet (600 mg total) by mouth every 6 (six) hours as needed for moderate pain., Starting Sat 06/03/2016, Until Sat 06/10/2016, Print       Note:  This document was prepared using Dragon voice recognition software and may include unintentional dictation errors.  Nanda Quinton, MD Emergency Medicine  I personally performed the services described in this documentation, which was scribed in my presence. The recorded information has been reviewed and is accurate.       Isabella Fast, MD 06/03/16 2000

## 2016-06-03 NOTE — ED Triage Notes (Signed)
Pt reports stepping down out of a gas station and forgetting there was a drop off.  Did not actually fall, but jarred her right knee.  States hurts extremely bad to bend it.  Occurred on Thursday.

## 2016-06-05 ENCOUNTER — Ambulatory Visit (INDEPENDENT_AMBULATORY_CARE_PROVIDER_SITE_OTHER): Payer: Medicare Other | Admitting: Orthopedic Surgery

## 2016-06-05 ENCOUNTER — Encounter: Payer: Self-pay | Admitting: Orthopedic Surgery

## 2016-06-05 VITALS — BP 157/99 | HR 59 | Ht 66.0 in | Wt 235.0 lb

## 2016-06-05 DIAGNOSIS — S83241A Other tear of medial meniscus, current injury, right knee, initial encounter: Secondary | ICD-10-CM | POA: Diagnosis not present

## 2016-06-05 DIAGNOSIS — M1711 Unilateral primary osteoarthritis, right knee: Secondary | ICD-10-CM | POA: Diagnosis not present

## 2016-06-05 NOTE — Patient Instructions (Addendum)
Okay to apply heat to the right knee  Wear brace for walking   Do 15 knee bends 3 x a d ay for the next 4 weeks   MRI March 30, arrive 3:45pm [facility] radiology

## 2016-06-05 NOTE — Progress Notes (Signed)
Patient ID: Isabella Holmes, female   DOB: June 25, 1945, 71 y.o.   MRN: [id number]  Chief Complaint  Patient presents with  . Knee Pain    right knee pain s/p fall 06/01/16    HPI Isabella Holmes is a 71 y.o. female.  She presents to [place] after stepping off of a step at a convenience is gas station in her leg jerked it twisted and felt acute medial knee pain. She had an arthroscopy of the knee about 5 years ago showed a partial medial meniscectomy. I have the operative notes it looks like the rest of her knee looked pretty good at that time  Since that time she's been unable to fully bend or straighten her knee. She has been to the ER x-rays show arthritis which looks moderate primarily medial compartment  She had been having some giving way episodes and pain over the medial joint line prior to this injury  Since that time she's had to use a crutch and has trouble bending and straightening the knee  So in summary we have a 71 year old female had an arthroscopy many years ago had a medial meniscectomy reinjured the knee complains of mild to moderate knee pain with loss of motion and inability to ambulate   Review of Systems Review of Systems  Respiratory: Negative for shortness of breath.   Cardiovascular: Negative for chest pain.   (2 MINIMUM)  Past Medical History:  Diagnosis Date  . Chest pain    w abnormal ECg  . Chronic headaches    migraine  . Dyslipidemia   . GERD (gastroesophageal reflux disease)   . Glucose intolerance (impaired glucose tolerance)   . Hemorrhoids 03/25/2015  . Seizure disorder (HCC)   . Seizures (HCC)   . Varicose veins 03/25/2015  . Venous insufficiency     Past Surgical History:  Procedure Laterality Date  . left vein stripping    . status post bilateral tubaligation    . TONSILLECTOMY     many years ago    Social History Social History  Substance Use Topics  . Smoking status: Former Smoker    Types: Cigarettes  . Smokeless tobacco: Never Used   . Alcohol use No    Allergies  Allergen Reactions  . Penicillins     REACTION: rash Has patient had a PCN reaction causing immediate rash, facial/tongue/throat swelling, SOB or lightheadedness with hypotension: No Has patient had a PCN reaction causing severe rash involving mucus membranes or skin necrosis: No Has patient had a PCN reaction that required hospitalization No Has patient had a PCN reaction occurring within the last 10 years: No If all of the above answers are "NO", then may proceed with Cephalosporin use.   . Streptomycin     REACTION: rash  . Sulfonamide Derivatives     REACTION: rash    Current Meds  Medication Sig  . aspirin 81 MG tablet Take 81 mg by mouth as needed for pain or fever.   . cetirizine (ZYRTEC) 10 MG tablet Take 10 mg by mouth as needed for allergies.  Marland Kitchen ibuprofen (ADVIL,MOTRIN) 600 MG tablet Take 1 tablet (600 mg total) by mouth every 6 (six) hours as needed for moderate pain.  Marland Kitchen lamoTRIgine (LAMICTAL) 150 MG tablet Take 150 mg by mouth at bedtime.   Marland Kitchen levothyroxine (SYNTHROID, LEVOTHROID) 50 MCG tablet Take 50 mcg by mouth daily before breakfast.   . Omega-3 Fatty Acids (FISH OIL) 1000 MG CAPS Take by mouth daily.  Physical Exam Physical Exam 1.BP (!) 157/99   Pulse (!) 59   Ht 5\' 6"  (1.676 m)   Wt 235 lb (106.6 kg)   BMI 37.93 kg/m   2. Gen. appearance. The patient is well-developed and well-nourished, grooming and hygiene are normal. There are no gross congenital abnormalities  3. The patient is alert and oriented to person place and time  4. Mood and affect are normal  5. Ambulation Supported by crutch with a significant limp  Examination reveals the following: 6. On inspection we find tenderness over the medial joint line no effusion  7. With the range of motion of  either only get the knee to bend about 30 it was painful  8. Stability tests were normal  in terms of the anterior cruciate ligament PCL and collateral  ligaments  9. Strength tests revealed grade 5 motor strength  10. Skin we find no rash ulceration or erythema  11. Sensation remains intact  12 Vascular system shows no peripheral edema  The left knee had normal range of motion  It was difficult to obtain the McMurray sign but the screw home test for meniscal tear was positive  MEDICAL DECISION MAKING:    Data Reviewed Plain films show arthritis of the medial compartment with bone spurs in the operative report shows that she had a torn medial meniscus and the remaining portion of her knee looked pretty normal  Assessment Diagnosis #1 torn medial meniscus re-tear right knee Diagnoses #2 arthritis chronic right knee Plan Recommend brace Continue ibuprofen Heat is working well also continue MRI right knee are presumptive diagnosis torn medial meniscus and we are expecting surgical intervention  Fuller Canada 06/05/2016, 2:23 PM

## 2016-06-09 ENCOUNTER — Ambulatory Visit (HOSPITAL_COMMUNITY)
Admission: RE | Admit: 2016-06-09 | Discharge: 2016-06-09 | Disposition: A | Discharge status: Home / Self Care | Payer: Medicare Other | Source: Ambulatory Visit | Attending: Orthopedic Surgery | Admitting: Orthopedic Surgery

## 2016-06-09 DIAGNOSIS — S83231A Complex tear of medial meniscus, current injury, right knee, initial encounter: Secondary | ICD-10-CM | POA: Diagnosis not present

## 2016-06-09 DIAGNOSIS — M67461 Ganglion, right knee: Secondary | ICD-10-CM | POA: Diagnosis not present

## 2016-06-09 DIAGNOSIS — S83241A Other tear of medial meniscus, current injury, right knee, initial encounter: Secondary | ICD-10-CM | POA: Diagnosis present

## 2016-06-09 DIAGNOSIS — M2241 Chondromalacia patellae, right knee: Secondary | ICD-10-CM | POA: Insufficient documentation

## 2016-06-09 DIAGNOSIS — M25561 Pain in right knee: Secondary | ICD-10-CM | POA: Diagnosis not present

## 2016-06-09 DIAGNOSIS — M25461 Effusion, right knee: Secondary | ICD-10-CM | POA: Insufficient documentation

## 2016-06-09 DIAGNOSIS — X58XXXA Exposure to other specified factors, initial encounter: Secondary | ICD-10-CM | POA: Insufficient documentation

## 2016-06-13 ENCOUNTER — Other Ambulatory Visit: Payer: Self-pay | Admitting: *Deleted

## 2016-06-13 ENCOUNTER — Encounter: Payer: Self-pay | Admitting: Orthopedic Surgery

## 2016-06-13 ENCOUNTER — Ambulatory Visit (INDEPENDENT_AMBULATORY_CARE_PROVIDER_SITE_OTHER): Payer: Medicare Other | Admitting: Orthopedic Surgery

## 2016-06-13 DIAGNOSIS — S83241A Other tear of medial meniscus, current injury, right knee, initial encounter: Secondary | ICD-10-CM

## 2016-06-13 NOTE — Patient Instructions (Signed)
You have decided to proceed with operative arthroscopy of the knee. You have decided not to continue with nonoperative measures such as but not limited to oral medication, weight loss, activity modification, physical therapy, bracing, or injection.  We will perform operative arthroscopy of the knee. Some of the risks associated with arthroscopic surgery of the knee include but are not limited to Bleeding Infection Swelling Stiffness Blood clot Pain  If you're not comfortable with these risks and would like to continue with nonoperative treatment please let Dr. Raidyn Breiner know prior to your surgery. 

## 2016-06-13 NOTE — Addendum Note (Signed)
Addended by: Baldomero Lamy B on: 06/13/2016 02:57 PM   Modules accepted: Orders, SmartSet

## 2016-06-13 NOTE — Progress Notes (Signed)
Patient ID: Isabella Holmes, female   DOB: Aug 07, 1945, 71 y.o.   MRN: [id number]  MRI FOLLOW UP  Chief Complaint  Patient presents with  . Follow-up    MRI REVIEW RIGHT KNEE    HPI Isabella Holmes is a 71 y.o. female.  No improvement in the knee still hurting MRI OF THE right knee Review of Systems  Constitutional: Negative for fever.  Neurological: Negative for tingling and focal weakness.    Physical Exam  See my last note  Data  Independent image interpretation the MRI showed arthritis of the knee all 3 compartments medial and patellofemoral compartments other worse she has a torn medial meniscus posterior horn and body  The report was read as follows   IMPRESSION: 1. Complex tear of the posterior horn -body junction of the medial meniscus. Small radial tear of the free edge of the body of the medial meniscus. 2. Tricompartmental cartilage abnormalities as described above.     Electronically Signed   By: Kathreen Devoid   On: 06/09/2016 17:10       The plan is to arthroscopy right knee partial medial meniscectomy  This procedure has been fully reviewed with the patient and written informed consent has been obtained.   [name], MD 06/13/2016 2:46 PM

## 2016-06-15 ENCOUNTER — Encounter: Payer: Self-pay | Admitting: *Deleted

## 2016-06-15 NOTE — Progress Notes (Signed)
Surgical pre authorization not required per patient plan Reference Jela S. 06/15/16 10:33am

## 2016-06-16 ENCOUNTER — Ambulatory Visit: Payer: Medicare Other | Admitting: Orthopedic Surgery

## 2016-06-20 NOTE — Patient Instructions (Signed)
Isabella Holmes  06/20/2016     @PREFPERIOPPHARMACY @   Your procedure is scheduled on  06/28/2016   Report to [facility] at  [email or web address].  Call this number if you have problems the morning of surgery:  [phone]   Remember:  Do not eat food or drink liquids after midnight.  Take these medicines the morning of surgery with A SIP OF WATER  Zyrtec, levothyroxine.   Do not wear jewelry, make-up or nail polish.  Do not wear lotions, powders, or perfumes, or deoderant.  Do not shave 48 hours prior to surgery.  Men may shave face and neck.  Do not bring valuables to the hospital.  [facility] is not responsible for any belongings or valuables.  Contacts, dentures or bridgework may not be worn into surgery.  Leave your suitcase in the car.  After surgery it may be brought to your room.  For patients admitted to the hospital, discharge time will be determined by your treatment team.  Patients discharged the day of surgery will not be allowed to drive home.   Name and phone number of your driver:   family Special instructions:  None  Please read over the following fact sheets that you were given. Anesthesia Post-op Instructions and Care and Recovery After Surgery      Knee Ligament Injury, Arthroscopy Arthroscopy is a surgical technique in which your health care provider examines your knee through a small, pencil-sized telescope (arthroscope). Often, repairs to injured ligaments can be done with instruments in the arthroscope. Arthroscopy is less invasive than open-knee surgery. Tell a health care provider about:  Any allergies you have.  All medicines you are taking, including vitamins, herbs, eye drops, creams, and over-the-counter medicines.  Any problems you or family members have had with anesthetic medicines.  Any blood disorders you have.  Any surgeries you have had.  Any medical conditions you have. What are the risks? Generally, this is a safe  procedure. However, as with any procedure, problems can occur. Possible problems include:  Infection.  Bleeding.  Stiffness. What happens before the procedure?  Ask your health care provider about changing or stopping any regular medicines. Avoid taking aspirin or blood thinners as directed by your health care provider.  Do not eat or drink anything after midnight the night before surgery.  If you smoke, do not smoke for at least 2 weeks before your surgery.  Do not drink alcohol starting the day before your surgery.  Let your health care provider know if you develop a cold or any infection before your surgery.  Arrange for someone to drive you home after the surgery or after your hospital stay. Also arrange for someone to help you with activities during recovery. What happens during the procedure?  Small monitors will be put on your body. They are used to check your heart, blood pressure, and oxygen levels.  An IV access tube will be put into one of your veins. Medicine will be able to flow directly into your body through this IV tube.  You might be given a medicine to help you relax (sedative).  You will be given a medicine that makes you go to sleep (general anesthetic), and a breathing tube will be placed into your lungs during the procedure.  Several small incisions are made in your knee. Saline fluid is placed into one of the incisions to expand the knee and clear away any blood in  the knee.  Your health care provider will insert the arthroscope to examine the injured knee.  During arthroscopy, your health care provider may find a partial or complete tear in a ligament.  Tools can be inserted through the other incisions to repair the injured ligaments.  The incisions are then closed with absorbable stitches and covered with dressings. What happens after the procedure?  You will be taken to the recovery area where you will be monitored.  When you are awake, stable, and  taking fluids without problems, you will be allowed to go home. This information is not intended to replace advice given to you by your health care provider. Make sure you discuss any questions you have with your health care provider. Document Released: 02/25/2000 Document Revised: 08/05/2015 Document Reviewed: 10/09/2012 Elsevier Interactive Patient Education  2017 [employer].  Arthroscopic Knee Ligament Repair, Care After This sheet gives you information about how to care for yourself after your procedure. Your health care provider may also give you more specific instructions. If you have problems or questions, contact your health care provider. What can I expect after the procedure? After the procedure, it is common to have:  Pain in your knee.  Bruising and swelling on your knee, calf, and ankle for 3-4 days.  Fatigue. Follow these instructions at home: If you have a brace or immobilizer:   Wear the brace or immobilizer as told by your health care provider. Remove it only as told by your health care provider.  Loosen the splint or immobilizer if your toes tingle, become numb, or turn cold and blue.  Keep the brace or immobilizer clean. Bathing   Do not take baths, swim, or use a hot tub until your health care provider approves. Ask your health care provider if you can take showers.  Keep your bandage (dressing) dry until your health care provider says that it can be removed. Cover it and your brace or immobilizer with a watertight covering when you take a shower. Incision care   Follow instructions from your health care provider about how to take care of your incision. Make sure you:  Wash your hands with soap and water before you change your bandage (dressing). If soap and water are not available, use hand sanitizer.  Change your dressing as told by your health care provider.  Leave stitches (sutures), skin glue, or adhesive strips in place. These skin closures may need to  stay in place for 2 weeks or longer. If adhesive strip edges start to loosen and curl up, you may trim the loose edges. Do not remove adhesive strips completely unless your health care provider tells you to do that.  Check your incision area every day for signs of infection. Check for:  More redness, swelling, or pain.  More fluid or blood.  Warmth.  Pus or a bad smell. Managing pain, stiffness, and swelling   If directed, put ice on the affected area.  If you have a removable brace or immobilizer, remove it as told by your health care provider.  Put ice in a plastic bag.  Place a towel between your skin and the bag or between your brace or immobilizer and the bag.  Leave the ice on for 20 minutes, 2-3 times a day.  Move your toes often to avoid stiffness and to lessen swelling.  Raise (elevate) the injured area above the level of your heart while you are sitting or lying down. Driving   Do not drive until  your health care provider approves. If you have a brace or immobilizer on your leg, ask your health care provider when it is safe for you to drive.  Do not drive or use heavy machinery while taking prescription pain medicine. Activity   Rest as directed. Ask your health care provider what activities are safe for you.  Do physical therapy exercises as told by your health care provider. Physical therapy will help you regain strength and motion in your knee.  Follow instructions from your health care provider about:  When you may start motion exercises.  When you may start riding a stationary bike and doing other low-impact activities.  When you may start to jog and do other high-impact activities. Safety   Do not use the injured limb to support your body weight until your health care provider says that you can. Use crutches as told by your health care provider. General instructions   Do not use any products that contain nicotine or tobacco, such as cigarettes and  e-cigarettes. These can delay bone healing. If you need help quitting, ask your health care provider.  To prevent or treat constipation while you are taking prescription pain medicine, your health care provider may recommend that you:  Drink enough fluid to keep your urine clear or pale yellow.  Take over-the-counter or prescription medicines.  Eat foods that are high in fiber, such as fresh fruits and vegetables, whole grains, and beans.  Limit foods that are high in fat and processed sugars, such as fried and sweet foods.  Take over-the-counter and prescription medicines only as told by your health care provider.  Keep all follow-up visits as told by your health care provider. This is important. Contact a health care provider if:  You have more redness, swelling, or pain around an incision.  You have more fluid or blood coming from an incision.  Your incision feels warm to the touch.  You have a fever.  You have pain or swelling in your knee, and it gets worse.  You have pain that does not get better with medicine. Get help right away if:  You have trouble breathing.  You have pus or a bad smell coming from an incision.  You have numbness and tingling near the knee joint. Summary  After the procedure, it is common to have knee pain with bruising and swelling on your knee, calf, and ankle.  Icing your knee and raising your leg above the level of your heart will help control the pain and the swelling.  Do physical therapy exercises as told by your health care provider. Physical therapy will help you regain strength and motion in your knee. This information is not intended to replace advice given to you by your health care provider. Make sure you discuss any questions you have with your health care provider. Document Released: 12/18/2012 Document Revised: 02/22/2016 Document Reviewed: 02/22/2016 Elsevier Interactive Patient Education  2017 [employer] PATIENT  INSTRUCTIONS POST-ANESTHESIA  IMMEDIATELY FOLLOWING SURGERY:  Do not drive or operate machinery for the first twenty four hours after surgery.  Do not make any important decisions for twenty four hours after surgery or while taking narcotic pain medications or sedatives.  If you develop intractable nausea and vomiting or a severe headache please notify your doctor immediately.  FOLLOW-UP:  Please make an appointment with your surgeon as instructed. You do not need to follow up with anesthesia unless specifically instructed to do so.  WOUND CARE INSTRUCTIONS (if applicable):  Keep a dry clean dressing on the anesthesia/puncture wound site if there is drainage.  Once the wound has quit draining you may leave it open to air.  Generally you should leave the bandage intact for twenty four hours unless there is drainage.  If the epidural site drains for more than 36-48 hours please call the anesthesia department.  QUESTIONS?:  Please feel free to call your physician or the hospital operator if you have any questions, and they will be happy to assist you.

## 2016-06-22 ENCOUNTER — Encounter (HOSPITAL_COMMUNITY)
Admission: RE | Admit: 2016-06-22 | Discharge: 2016-06-22 | Disposition: A | Discharge status: Home / Self Care | Payer: Medicare Other | Source: Ambulatory Visit | Attending: Orthopedic Surgery | Admitting: Orthopedic Surgery

## 2016-06-22 ENCOUNTER — Encounter (HOSPITAL_COMMUNITY): Payer: Self-pay

## 2016-06-22 DIAGNOSIS — Z01818 Encounter for other preprocedural examination: Secondary | ICD-10-CM | POA: Insufficient documentation

## 2016-06-22 HISTORY — DX: Hypothyroidism, unspecified: E03.9

## 2016-06-22 HISTORY — DX: Unspecified asthma, uncomplicated: J45.909

## 2016-06-22 HISTORY — DX: Sleep apnea, unspecified: G47.30

## 2016-06-22 HISTORY — DX: Dyspnea, unspecified: R06.00

## 2016-06-22 HISTORY — DX: Unspecified osteoarthritis, unspecified site: M19.90

## 2016-06-22 LAB — CBC WITH DIFFERENTIAL/PLATELET
BASOS ABS: 0 10*3/uL (ref 0.0–0.1)
BASOS PCT: 1 %
EOS PCT: 4 %
Eosinophils Absolute: 0.2 10*3/uL (ref 0.0–0.7)
HCT: 41.1 % (ref 36.0–46.0)
Hemoglobin: 13.9 g/dL (ref 12.0–15.0)
Lymphocytes Relative: 47 %
Lymphs Abs: 1.8 10*3/uL (ref 0.7–4.0)
MCH: 30.2 pg (ref 26.0–34.0)
MCHC: 33.8 g/dL (ref 30.0–36.0)
MCV: 89.2 fL (ref 78.0–100.0)
MONO ABS: 0.4 10*3/uL (ref 0.1–1.0)
Monocytes Relative: 10 %
Neutro Abs: 1.4 10*3/uL — ABNORMAL LOW (ref 1.7–7.7)
Neutrophils Relative %: 38 %
PLATELETS: 196 10*3/uL (ref 150–400)
RBC: 4.61 MIL/uL (ref 3.87–5.11)
RDW: 13.3 % (ref 11.5–15.5)
WBC: 3.8 10*3/uL — ABNORMAL LOW (ref 4.0–10.5)

## 2016-06-22 LAB — BASIC METABOLIC PANEL
ANION GAP: 6 (ref 5–15)
BUN: 12 mg/dL (ref 6–20)
CALCIUM: 9.8 mg/dL (ref 8.9–10.3)
CO2: 29 mmol/L (ref 22–32)
CREATININE: 0.8 mg/dL (ref 0.44–1.00)
Chloride: 104 mmol/L (ref 101–111)
GLUCOSE: 93 mg/dL (ref 65–99)
Potassium: 4 mmol/L (ref 3.5–5.1)
Sodium: 139 mmol/L (ref 135–145)

## 2016-06-22 LAB — SURGICAL PCR SCREEN
MRSA, PCR: NEGATIVE
Staphylococcus aureus: NEGATIVE

## 2016-06-27 NOTE — H&P (Signed)
Chief Complaint  Patient presents with  . Knee Pain      right knee pain s/p fall 06/01/16      HPI Isabella Holmes is a 71 y.o. female.  She presents to [place] after stepping off of a step [place] a [employer] [place] [employer] in her leg jerked it twisted and felt acute medial knee pain. She had an arthroscopy of the knee about 5 years ago showed a partial medial meniscectomy. I have the operative notes it looks like the rest of her knee looked pretty good at that time   Since that time she's been unable to fully bend or straighten her knee. She has been to the ER x-rays show arthritis which looks moderate primarily medial compartment   She had been having some giving way episodes and pain over the medial joint line prior to this injury   Since that time she's had to use a crutch and has trouble bending and straightening the knee   So in summary we have a 71 year old female had an arthroscopy many years ago had a medial meniscectomy reinjured the knee complains of mild to moderate knee pain with loss of motion and inability to ambulate     Review of Systems Review of Systems  Respiratory: Negative for shortness of breath.   Cardiovascular: Negative for chest pain.    (2 MINIMUM)       Past Medical History:  Diagnosis Date  . Chest pain      w abnormal ECg  . Chronic headaches      migraine  . Dyslipidemia    . GERD (gastroesophageal reflux disease)    . Glucose intolerance (impaired glucose tolerance)    . Hemorrhoids 03/25/2015  . Seizure disorder ([place])    . Seizures ([place])    . Varicose veins 03/25/2015  . Venous insufficiency             Past Surgical History:  Procedure Laterality Date  . left vein stripping      . status post bilateral tubaligation      . TONSILLECTOMY        many years ago      Social History      Social History  Substance Use Topics  . Smoking status: Former Smoker      Types: Cigarettes  . Smokeless tobacco: Never Used  . Alcohol use No           Allergies  Allergen Reactions  . Penicillins        REACTION: rash Has patient had a PCN reaction causing immediate rash, facial/tongue/throat swelling, SOB or lightheadedness with hypotension: No Has patient had a PCN reaction causing severe rash involving mucus membranes or skin necrosis: No Has patient had a PCN reaction that required hospitalization No Has patient had a PCN reaction occurring within the last 10 years: No If all of the above answers are "NO", then may proceed with Cephalosporin use.    . Streptomycin        REACTION: rash  . Sulfonamide Derivatives        REACTION: rash      Active Medications      Current Meds  Medication Sig  . aspirin 81 MG tablet Take 81 mg by mouth as needed for pain or fever.   . cetirizine (ZYRTEC) 10 MG tablet Take 10 mg by mouth as needed for allergies.  Marland Kitchen ibuprofen (ADVIL,MOTRIN) 600 MG tablet Take 1 tablet (600 mg total) by mouth  every 6 (six) hours as needed for moderate pain.  Marland Kitchen lamoTRIgine (LAMICTAL) 150 MG tablet Take 150 mg by mouth at bedtime.   Marland Kitchen levothyroxine (SYNTHROID, LEVOTHROID) 50 MCG tablet Take 50 mcg by mouth daily before breakfast.   . Omega-3 Fatty Acids (FISH OIL) 1000 MG CAPS Take by mouth daily.              Physical Exam Physical Exam 1.BP (!) 157/99   Pulse (!) 59   Ht 5\' 6"  (1.676 m)   Wt 235 lb (106.6 kg)   BMI 37.93 kg/m    2. Gen. appearance. The patient is well-developed and well-nourished, grooming and hygiene are normal. There are no gross congenital abnormalities   3. The patient is alert and oriented to person place and time   4. Mood and affect are normal   5. Ambulation Supported by crutch with a significant limp   Examination reveals the following: 6. On inspection we find tenderness over the medial joint line no effusion   7. With the range of motion of  either only get the knee to bend about 30 it was painful   8. Stability tests were normal  in terms of the anterior  cruciate ligament PCL and collateral ligaments   9. Strength tests revealed grade 5 motor strength   10. Skin we find no rash ulceration or erythema   11. Sensation remains intact   12 Vascular system shows no peripheral edema   The left knee had normal range of motion   It was difficult to obtain the McMurray sign but the screw home test for meniscal tear was positive   MEDICAL DECISION MAKING:    Data Reviewed Plain films show arthritis of the medial compartment with bone spurs in the operative report shows that she had a torn medial meniscus and the remaining portion of her knee looked pretty normal   Assessment Diagnosis #1 torn medial meniscus re-tear right knee Diagnoses #2 arthritis chronic right knee  Mri was obtained   IMPRESSION: 1. Complex tear of the posterior horn -body junction of the medial meniscus. Small radial tear of the free edge of the body of the medial meniscus. 2. Tricompartmental cartilage abnormalities as described above.     Electronically Signed   By: Kathreen Devoid   On: 06/09/2016 17:10     Plan is for arthroscopic partial medial meniscectomy of the right knee

## 2016-06-28 ENCOUNTER — Encounter (HOSPITAL_COMMUNITY)
Admission: RE | Disposition: A | Discharge status: Home / Self Care | Payer: Self-pay | Source: Ambulatory Visit | Attending: Orthopedic Surgery

## 2016-06-28 ENCOUNTER — Encounter (HOSPITAL_COMMUNITY): Payer: Self-pay | Admitting: *Deleted

## 2016-06-28 ENCOUNTER — Ambulatory Visit (HOSPITAL_COMMUNITY): Payer: Medicare Other | Admitting: Anesthesiology

## 2016-06-28 ENCOUNTER — Ambulatory Visit (HOSPITAL_COMMUNITY)
Admission: RE | Admit: 2016-06-28 | Discharge: 2016-06-28 | Disposition: A | Discharge status: Home / Self Care | Payer: Medicare Other | Source: Ambulatory Visit | Attending: Orthopedic Surgery | Admitting: Orthopedic Surgery

## 2016-06-28 DIAGNOSIS — E039 Hypothyroidism, unspecified: Secondary | ICD-10-CM | POA: Insufficient documentation

## 2016-06-28 DIAGNOSIS — K219 Gastro-esophageal reflux disease without esophagitis: Secondary | ICD-10-CM | POA: Diagnosis not present

## 2016-06-28 DIAGNOSIS — M23321 Other meniscus derangements, posterior horn of medial meniscus, right knee: Secondary | ICD-10-CM | POA: Diagnosis not present

## 2016-06-28 DIAGNOSIS — Z7982 Long term (current) use of aspirin: Secondary | ICD-10-CM | POA: Insufficient documentation

## 2016-06-28 DIAGNOSIS — M2241 Chondromalacia patellae, right knee: Secondary | ICD-10-CM | POA: Insufficient documentation

## 2016-06-28 DIAGNOSIS — Z87891 Personal history of nicotine dependence: Secondary | ICD-10-CM | POA: Diagnosis not present

## 2016-06-28 DIAGNOSIS — Z79899 Other long term (current) drug therapy: Secondary | ICD-10-CM | POA: Diagnosis not present

## 2016-06-28 DIAGNOSIS — S83241A Other tear of medial meniscus, current injury, right knee, initial encounter: Secondary | ICD-10-CM | POA: Insufficient documentation

## 2016-06-28 DIAGNOSIS — X501XXA Overexertion from prolonged static or awkward postures, initial encounter: Secondary | ICD-10-CM | POA: Insufficient documentation

## 2016-06-28 DIAGNOSIS — M659 Synovitis and tenosynovitis, unspecified: Secondary | ICD-10-CM | POA: Insufficient documentation

## 2016-06-28 DIAGNOSIS — M1711 Unilateral primary osteoarthritis, right knee: Secondary | ICD-10-CM | POA: Diagnosis not present

## 2016-06-28 DIAGNOSIS — G4730 Sleep apnea, unspecified: Secondary | ICD-10-CM | POA: Diagnosis not present

## 2016-06-28 DIAGNOSIS — M25561 Pain in right knee: Secondary | ICD-10-CM | POA: Diagnosis present

## 2016-06-28 HISTORY — PX: KNEE ARTHROSCOPY WITH MEDIAL MENISECTOMY: SHX5651

## 2016-06-28 SURGERY — ARTHROSCOPY, KNEE, WITH MEDIAL MENISCECTOMY
Anesthesia: General | Laterality: Right

## 2016-06-28 MED ORDER — VANCOMYCIN HCL IN DEXTROSE 1-5 GM/200ML-% IV SOLN
1000.0000 mg | INTRAVENOUS | Status: AC
Start: 2016-06-28 — End: 2016-06-28
  Administered 2016-06-28: 1000 mg via INTRAVENOUS
  Filled 2016-06-28: qty 200

## 2016-06-28 MED ORDER — FENTANYL CITRATE (PF) 100 MCG/2ML IJ SOLN
INTRAMUSCULAR | Status: DC | PRN
  Administered 2016-06-28 (×2): 25 ug via INTRAVENOUS
  Administered 2016-06-28: 50 ug via INTRAVENOUS

## 2016-06-28 MED ORDER — LACTATED RINGERS IV SOLN
INTRAVENOUS | Status: DC
  Administered 2016-06-28: 1000 mL via INTRAVENOUS

## 2016-06-28 MED ORDER — LIDOCAINE HCL (CARDIAC) 10 MG/ML IV SOLN
INTRAVENOUS | Status: DC | PRN
  Administered 2016-06-28: 30 mg via INTRAVENOUS

## 2016-06-28 MED ORDER — FENTANYL CITRATE (PF) 100 MCG/2ML IJ SOLN
INTRAMUSCULAR | Status: AC
  Filled 2016-06-28: qty 2

## 2016-06-28 MED ORDER — PROPOFOL 10 MG/ML IV BOLUS
INTRAVENOUS | Status: AC
  Filled 2016-06-28: qty 40

## 2016-06-28 MED ORDER — ONDANSETRON HCL 4 MG/2ML IJ SOLN
INTRAMUSCULAR | Status: AC
  Filled 2016-06-28: qty 2

## 2016-06-28 MED ORDER — BUPIVACAINE-EPINEPHRINE (PF) 0.5% -1:200000 IJ SOLN
INTRAMUSCULAR | Status: AC
  Filled 2016-06-28: qty 30

## 2016-06-28 MED ORDER — HYDROCODONE-ACETAMINOPHEN 5-325 MG PO TABS: 1.0000 | ORAL_TABLET | ORAL | 0 refills | Status: DC | PRN

## 2016-06-28 MED ORDER — HYDROCODONE-ACETAMINOPHEN 5-325 MG PO TABS
ORAL_TABLET | ORAL | Status: AC
  Filled 2016-06-28: qty 1

## 2016-06-28 MED ORDER — SODIUM CHLORIDE 0.9 % IR SOLN
Status: DC | PRN
  Administered 2016-06-28: 3 mL

## 2016-06-28 MED ORDER — ONDANSETRON HCL 4 MG/2ML IJ SOLN
4.0000 mg | Freq: Once | INTRAMUSCULAR | Status: AC
  Administered 2016-06-28: 4 mg via INTRAVENOUS

## 2016-06-28 MED ORDER — MIDAZOLAM HCL 2 MG/2ML IJ SOLN
INTRAMUSCULAR | Status: AC
  Filled 2016-06-28: qty 2

## 2016-06-28 MED ORDER — SODIUM CHLORIDE 0.9 % IJ SOLN
INTRAMUSCULAR | Status: AC
  Filled 2016-06-28: qty 10

## 2016-06-28 MED ORDER — KETOROLAC TROMETHAMINE 30 MG/ML IJ SOLN
INTRAMUSCULAR | Status: AC
  Filled 2016-06-28: qty 1

## 2016-06-28 MED ORDER — HYDROCODONE-ACETAMINOPHEN 5-325 MG PO TABS
1.0000 | ORAL_TABLET | Freq: Once | ORAL | Status: AC
  Administered 2016-06-28: 1 via ORAL

## 2016-06-28 MED ORDER — FENTANYL CITRATE (PF) 100 MCG/2ML IJ SOLN
25.0000 ug | INTRAMUSCULAR | Status: DC | PRN
  Administered 2016-06-28 (×3): 50 ug via INTRAVENOUS
  Filled 2016-06-28: qty 2

## 2016-06-28 MED ORDER — SODIUM CHLORIDE 0.9 % IR SOLN
Status: DC | PRN
  Administered 2016-06-28: 1000 mL

## 2016-06-28 MED ORDER — CHLORHEXIDINE GLUCONATE 4 % EX LIQD: 60.0000 mL | Freq: Once | CUTANEOUS | Status: DC

## 2016-06-28 MED ORDER — MIDAZOLAM HCL 2 MG/2ML IJ SOLN
1.0000 mg | INTRAMUSCULAR | Status: AC
Start: 2016-06-28 — End: 2016-06-28
  Administered 2016-06-28: 2 mg via INTRAVENOUS

## 2016-06-28 MED ORDER — EPINEPHRINE PF 1 MG/ML IJ SOLN
INTRAMUSCULAR | Status: AC
  Filled 2016-06-28: qty 5

## 2016-06-28 MED ORDER — KETOROLAC TROMETHAMINE 30 MG/ML IJ SOLN
30.0000 mg | Freq: Once | INTRAMUSCULAR | Status: AC
  Administered 2016-06-28: 30 mg via INTRAVENOUS

## 2016-06-28 MED ORDER — EPHEDRINE SULFATE 50 MG/ML IJ SOLN
INTRAMUSCULAR | Status: AC
  Filled 2016-06-28: qty 1

## 2016-06-28 MED ORDER — LIDOCAINE HCL (PF) 1 % IJ SOLN
INTRAMUSCULAR | Status: AC
  Filled 2016-06-28: qty 5

## 2016-06-28 MED ORDER — DEXTROSE 5 % IV SOLN
INTRAVENOUS | Status: DC | PRN
  Administered 2016-06-28: 10:00:00 via INTRAVENOUS

## 2016-06-28 MED ORDER — BUPIVACAINE-EPINEPHRINE (PF) 0.5% -1:200000 IJ SOLN
INTRAMUSCULAR | Status: DC | PRN
  Administered 2016-06-28: 60 mL via PERINEURAL

## 2016-06-28 MED ORDER — PROPOFOL 10 MG/ML IV BOLUS
INTRAVENOUS | Status: DC | PRN
  Administered 2016-06-28: 150 mg via INTRAVENOUS

## 2016-06-28 SURGICAL SUPPLY — 52 items
ARTHROWAND PARAGON T2 (SURGICAL WAND)
BAG HAMPER (MISCELLANEOUS) ×3 IMPLANT
BANDAGE ELASTIC 6 LF NS (GAUZE/BANDAGES/DRESSINGS) ×3 IMPLANT
BLADE AGGRESSIVE PLUS 4.0 (BLADE) ×3 IMPLANT
BLADE SURG SZ11 CARB STEEL (BLADE) ×3 IMPLANT
CHLORAPREP W/TINT 26ML (MISCELLANEOUS) ×3 IMPLANT
CLOTH BEACON ORANGE TIMEOUT ST (SAFETY) ×3 IMPLANT
COOLER CRYO IC GRAV AND TUBE (ORTHOPEDIC SUPPLIES) ×3 IMPLANT
CUFF CRYO KNEE LG 20X31 COOLER (ORTHOPEDIC SUPPLIES) IMPLANT
CUFF CRYO KNEE18X23 MED (MISCELLANEOUS) ×3 IMPLANT
CUFF TOURNIQUET SINGLE 34IN LL (TOURNIQUET CUFF) ×3 IMPLANT
CUTTER ANGLED DBL BITE 4.5 (BURR) IMPLANT
DECANTER SPIKE VIAL GLASS SM (MISCELLANEOUS) ×6 IMPLANT
GAUZE SPONGE 4X4 12PLY STRL (GAUZE/BANDAGES/DRESSINGS) ×3 IMPLANT
GAUZE SPONGE 4X4 16PLY XRAY LF (GAUZE/BANDAGES/DRESSINGS) ×3 IMPLANT
GAUZE XEROFORM 5X9 LF (GAUZE/BANDAGES/DRESSINGS) ×3 IMPLANT
GLOVE BIOGEL PI IND STRL 6.5 (GLOVE) ×1 IMPLANT
GLOVE BIOGEL PI IND STRL 7.0 (GLOVE) ×1 IMPLANT
GLOVE BIOGEL PI INDICATOR 6.5 (GLOVE) ×2
GLOVE BIOGEL PI INDICATOR 7.0 (GLOVE) ×2
GLOVE SKINSENSE NS SZ8.0 LF (GLOVE) ×2
GLOVE SKINSENSE STRL SZ8.0 LF (GLOVE) ×1 IMPLANT
GLOVE SS N UNI LF 8.5 STRL (GLOVE) ×3 IMPLANT
GOWN STRL REUS W/ TWL LRG LVL3 (GOWN DISPOSABLE) ×1 IMPLANT
GOWN STRL REUS W/TWL LRG LVL3 (GOWN DISPOSABLE) ×2
GOWN STRL REUS W/TWL XL LVL3 (GOWN DISPOSABLE) ×3 IMPLANT
HLDR LEG FOAM (MISCELLANEOUS) ×1 IMPLANT
IV NS IRRIG 3000ML ARTHROMATIC (IV SOLUTION) ×6 IMPLANT
KIT BLADEGUARD II DBL (SET/KITS/TRAYS/PACK) ×3 IMPLANT
KIT ROOM TURNOVER AP CYSTO (KITS) ×3 IMPLANT
LEG HOLDER FOAM (MISCELLANEOUS) ×2
MANIFOLD NEPTUNE II (INSTRUMENTS) ×3 IMPLANT
MARKER SKIN DUAL TIP RULER LAB (MISCELLANEOUS) ×3 IMPLANT
NEEDLE HYPO 18GX1.5 BLUNT FILL (NEEDLE) ×3 IMPLANT
NEEDLE HYPO 21X1.5 SAFETY (NEEDLE) ×3 IMPLANT
NEEDLE SPNL 18GX3.5 QUINCKE PK (NEEDLE) ×3 IMPLANT
NS IRRIG 1000ML POUR BTL (IV SOLUTION) ×3 IMPLANT
PACK ARTHRO LIMB DRAPE STRL (MISCELLANEOUS) ×3 IMPLANT
PAD ABD 5X9 TENDERSORB (GAUZE/BANDAGES/DRESSINGS) ×3 IMPLANT
PAD ARMBOARD 7.5X6 YLW CONV (MISCELLANEOUS) ×3 IMPLANT
PADDING CAST COTTON 6X4 STRL (CAST SUPPLIES) ×3 IMPLANT
PADDING WEBRIL 6 STERILE (GAUZE/BANDAGES/DRESSINGS) ×3 IMPLANT
PROBE BIPOLAR 50 DEGREE SUCT (MISCELLANEOUS) ×3 IMPLANT
SET ARTHROSCOPY INST (INSTRUMENTS) ×3 IMPLANT
SET ARTHROSCOPY PUMP TUBE (IRRIGATION / IRRIGATOR) ×3 IMPLANT
SET BASIN LINEN APH (SET/KITS/TRAYS/PACK) ×3 IMPLANT
SUT ETHILON 3 0 FSL (SUTURE) ×3 IMPLANT
SYR 30ML LL (SYRINGE) ×3 IMPLANT
SYRINGE 10CC LL (SYRINGE) ×3 IMPLANT
TUBE CONNECTING 12'X1/4 (SUCTIONS) ×3
TUBE CONNECTING 12X1/4 (SUCTIONS) ×6 IMPLANT
WAND ARTHRO PARAGON T2 (SURGICAL WAND) IMPLANT

## 2016-06-28 NOTE — Op Note (Signed)
06/28/2016  11:25 AM  PATIENT:  Isabella Holmes  71 y.o. female  PRE-OPERATIVE DIAGNOSIS:  RIGHT MEDIAL MENISCUS TEAR  POST-OPERATIVE DIAGNOSIS:  RIGHT MEDIAL MENISCUS TEAR  PROCEDURE:  Procedure(s): KNEE ARTHROSCOPY WITH MEDIAL MENISECTOMY (Right)  There is what we found #1 mild to moderate synovitis all 3 compartments #2 torn medial meniscus involving the posterior horn and body #3 grade 2 diffuse chondral malacia medial femoral condyle #4 grade 3 lateral facet chondromalacia patella #5 diffuse grade 2 fibrillation lateral femoral condyle  Knee arthroscopy dictation  The patient was identified in the preoperative holding area using 2 approved identification mechanisms. The chart was reviewed and updated. The surgical site was confirmed as RIGHT  knee and marked with an indelible marker.  The patient was taken to the operating room for anesthesia. After successful  GENERAL  anesthesia, VANCOMYCIN  was used as IV antibiotics.  The patient was placed in the supine position with the (RIGHT LEG) the operative extremity in an arthroscopic leg holder and the opposite extremity in a padded leg holder.  The timeout was executed.  A lateral portal was established with an 11 blade and the scope was introduced into the joint. A diagnostic arthroscopy was performed in circumferential manner examining the entire knee joint. A medial portal was established and the diagnostic arthroscopy was repeated using a probe to palpate intra-articular structures as they were encountered.   The MEDIAL  meniscus was resected using a duckbill forceps. The meniscal fragments were removed with a motorized shaver. The meniscus was balanced with a combination of a motorized shaver and a 50 LINVATEC wand until a stable rim was obtained.  The arthroscopic pump was placed on the wash mode and any excess debris was removed from the joint using suction.  60 cc of Marcaine with epinephrine was injected through the  arthroscope.  The portals were closed with 3-0 nylon suture.  A sterile bandage, Ace wrap and Cryo/Cuff was placed and the Cryo/Cuff was activated. The patient was taken to the recovery room in stable condition.  SURGEON:  Surgeon(s) and Role:    * Carole Civil, MD - Primary  PHYSICIAN ASSISTANT:   ASSISTANTS: none   ANESTHESIA:   general  EBL:  Total I/O In: 600 [I.V.:600] Out: 0   BLOOD ADMINISTERED:none  DRAINS: none   LOCAL MEDICATIONS USED:  MARCAINE     SPECIMEN:  No Specimen  DISPOSITION OF SPECIMEN:  N/A  COUNTS:  YES  TOURNIQUET:  * No tourniquets in log *  DICTATION: .Dragon Dictation  PLAN OF CARE: Admit to inpatient   PATIENT DISPOSITION:  PACU - hemodynamically stable.   Delay start of Pharmacological VTE agent (>24hrs) due to surgical blood loss or risk of bleeding: not applicable  [id number]

## 2016-06-28 NOTE — Interval H&P Note (Signed)
History and Physical Interval Note:  06/28/2016 10:14 AM  Isabella Holmes  has presented today for surgery, with the diagnosis of RIGHT MEDIAL MENISCUS TEAR  The various methods of treatment have been discussed with the patient and family. After consideration of risks, benefits and other options for treatment, the patient has consented to  Procedure(s): KNEE ARTHROSCOPY WITH MEDIAL MENISECTOMY (Right) as a surgical intervention .  The patient's history has been reviewed, patient examined, no change in status, stable for surgery.  I have reviewed the patient's chart and labs.  Questions were answered to the patient's satisfaction.     [name]

## 2016-06-28 NOTE — Anesthesia Postprocedure Evaluation (Signed)
Anesthesia Post Note (Late Entry for 1159)  Patient: Isabella Holmes  Procedure(s) Performed: Procedure(s) (LRB): KNEE ARTHROSCOPY WITH MEDIAL MENISECTOMY (Right)  Patient location during evaluation: PACU Anesthesia Type: General Level of consciousness: awake and alert, oriented and patient cooperative Pain management: pain level controlled Vital Signs Assessment: post-procedure vital signs reviewed and stable Respiratory status: spontaneous breathing Cardiovascular status: stable Anesthetic complications: no     Last Vitals:  Vitals:   06/28/16 1228 06/28/16 1252  BP:  136/64  Pulse: 76   Resp: (!) 26   Temp:  36.5 C    Last Pain:  Vitals:   06/28/16 1252  TempSrc:   PainSc: 2                  ADAMS, AMY A

## 2016-06-28 NOTE — Anesthesia Preprocedure Evaluation (Signed)
Anesthesia Evaluation  Patient identified by MRN, date of birth, ID band Patient awake    Reviewed: Allergy & Precautions, NPO status , Patient's Chart, lab work & pertinent test results  Airway Mallampati: II  TM Distance: >3 FB     Dental  (+) Teeth Intact   Pulmonary shortness of breath and with exertion, asthma , sleep apnea , former smoker,    breath sounds clear to auscultation       Cardiovascular + DOE   Rhythm:Regular Rate:Normal     Neuro/Psych  Headaches, Seizures -,  PSYCHIATRIC DISORDERS Anxiety    GI/Hepatic GERD  Controlled and Medicated,  Endo/Other  Hypothyroidism   Renal/GU      Musculoskeletal  (+) Arthritis ,   Abdominal   Peds  Hematology   Anesthesia Other Findings   Reproductive/Obstetrics                             Anesthesia Physical Anesthesia Plan  ASA: III  Anesthesia Plan: General   Post-op Pain Management:    Induction: Intravenous  Airway Management Planned: LMA  Additional Equipment:   Intra-op Plan:   Post-operative Plan: Extubation in OR  Informed Consent: I have reviewed the patients History and Physical, chart, labs and discussed the procedure including the risks, benefits and alternatives for the proposed anesthesia with the patient or authorized representative who has indicated his/her understanding and acceptance.     Plan Discussed with:   Anesthesia Plan Comments:         Anesthesia Quick Evaluation

## 2016-06-28 NOTE — Transfer of Care (Signed)
Immediate Anesthesia Transfer of Care Note  Patient: Isabella Holmes  Procedure(s) Performed: Procedure(s): KNEE ARTHROSCOPY WITH MEDIAL MENISECTOMY (Right)  Patient Location: PACU  Anesthesia Type:General  Level of Consciousness: drowsy and patient cooperative  Airway & Oxygen Therapy: Patient Spontanous Breathing and Patient connected to face mask oxygen  Post-op Assessment: Report given to RN and Post -op Vital signs reviewed and stable  Post vital signs: Reviewed and stable  Last Vitals:  Vitals:   06/28/16 1020 06/28/16 1025  BP: 133/65 (!) 140/57  Pulse:    Resp: 19 17  Temp:      Last Pain:  Vitals:   06/28/16 0915  TempSrc: Oral  PainSc: 3       Patients Stated Pain Goal: 7 (33/35/45 6256)  Complications: No apparent anesthesia complications

## 2016-06-28 NOTE — Anesthesia Procedure Notes (Signed)
Procedure Name: LMA Insertion Date/Time: 06/28/2016 10:36 AM Performed by: Andree Elk, Altus Zaino A Pre-anesthesia Checklist: Patient identified, Timeout performed, Emergency Drugs available, Suction available and Patient being monitored Patient Re-evaluated:Patient Re-evaluated prior to inductionOxygen Delivery Method: Circle system utilized Intubation Type: IV induction Ventilation: Mask ventilation without difficulty LMA: LMA inserted LMA Size: 4.0 Number of attempts: 1 Placement Confirmation: positive ETCO2 and breath sounds checked- equal and bilateral Tube secured with: Tape Dental Injury: Teeth and Oropharynx as per pre-operative assessment

## 2016-06-28 NOTE — Brief Op Note (Signed)
06/28/2016  11:25 AM  PATIENT:  Isabella Holmes  71 y.o. female  PRE-OPERATIVE DIAGNOSIS:  RIGHT MEDIAL MENISCUS TEAR  POST-OPERATIVE DIAGNOSIS:  RIGHT MEDIAL MENISCUS TEAR  PROCEDURE:  Procedure(s): KNEE ARTHROSCOPY WITH MEDIAL MENISECTOMY (Right)  There is what we found #1 mild to moderate synovitis all 3 compartments #2 torn medial meniscus involving the posterior horn and body #3 grade 2 diffuse chondral malacia medial femoral condyle #4 grade 3 lateral facet chondromalacia patella #5 diffuse grade 2 fibrillation lateral femoral condyle  Knee arthroscopy dictation  The patient was identified in the preoperative holding area using 2 approved identification mechanisms. The chart was reviewed and updated. The surgical site was confirmed as RIGHT  knee and marked with an indelible marker.  The patient was taken to the operating room for anesthesia. After successful  GENERAL  anesthesia, VANCOMYCIN  was used as IV antibiotics.  The patient was placed in the supine position with the (RIGHT LEG) the operative extremity in an arthroscopic leg holder and the opposite extremity in a padded leg holder.  The timeout was executed.  A lateral portal was established with an 11 blade and the scope was introduced into the joint. A diagnostic arthroscopy was performed in circumferential manner examining the entire knee joint. A medial portal was established and the diagnostic arthroscopy was repeated using a probe to palpate intra-articular structures as they were encountered.   The MEDIAL  meniscus was resected using a duckbill forceps. The meniscal fragments were removed with a motorized shaver. The meniscus was balanced with a combination of a motorized shaver and a 50 LINVATEC wand until a stable rim was obtained.  The arthroscopic pump was placed on the wash mode and any excess debris was removed from the joint using suction.  60 cc of Marcaine with epinephrine was injected through the  arthroscope.  The portals were closed with 3-0 nylon suture.  A sterile bandage, Ace wrap and Cryo/Cuff was placed and the Cryo/Cuff was activated. The patient was taken to the recovery room in stable condition.  SURGEON:  Surgeon(s) and Role:    * Carole Civil, MD - Primary  PHYSICIAN ASSISTANT:   ASSISTANTS: none   ANESTHESIA:   general  EBL:  Total I/O In: 600 [I.V.:600] Out: 0   BLOOD ADMINISTERED:none  DRAINS: none   LOCAL MEDICATIONS USED:  MARCAINE     SPECIMEN:  No Specimen  DISPOSITION OF SPECIMEN:  N/A  COUNTS:  YES  TOURNIQUET:  * No tourniquets in log *  DICTATION: .Dragon Dictation  PLAN OF CARE: Admit to inpatient   PATIENT DISPOSITION:  PACU - hemodynamically stable.   Delay start of Pharmacological VTE agent (>24hrs) due to surgical blood loss or risk of bleeding: not applicable  [id number]

## 2016-06-30 ENCOUNTER — Encounter (HOSPITAL_COMMUNITY): Payer: Self-pay | Admitting: Orthopedic Surgery

## 2016-07-03 ENCOUNTER — Encounter: Payer: Self-pay | Admitting: Orthopedic Surgery

## 2016-07-03 ENCOUNTER — Ambulatory Visit (INDEPENDENT_AMBULATORY_CARE_PROVIDER_SITE_OTHER): Payer: Self-pay | Admitting: Orthopedic Surgery

## 2016-07-03 DIAGNOSIS — Z9889 Other specified postprocedural states: Secondary | ICD-10-CM

## 2016-07-03 DIAGNOSIS — Z4889 Encounter for other specified surgical aftercare: Secondary | ICD-10-CM

## 2016-07-03 MED ORDER — IBU 600 MG PO TABS: 600.0000 mg | ORAL_TABLET | Freq: Four times a day (QID) | ORAL | 0 refills | Status: DC | PRN

## 2016-07-03 NOTE — Progress Notes (Signed)
Postop visit #1 status post knee arthroscopy  Chief Complaint  Patient presents with  . Follow-up    SARK, DOS 06/28/16     Operative report  06/28/2016  11:25 AM  PATIENT:  Isabella Holmes  71 y.o. female  PRE-OPERATIVE DIAGNOSIS:  RIGHT MEDIAL MENISCUS TEAR  POST-OPERATIVE DIAGNOSIS:  RIGHT MEDIAL MENISCUS TEAR  PROCEDURE:  Procedure(s): KNEE ARTHROSCOPY WITH MEDIAL MENISECTOMY (Right)  There is what we found #1 mild to moderate synovitis all 3 compartments #2 torn medial meniscus involving the posterior horn and body #3 grade 2 diffuse chondral malacia medial femoral condyle #4 grade 3 lateral facet chondromalacia patella #5 diffuse grade 2 fibrillation lateral femoral condyle   29881   Problems issues concerned: NONE   Current Meds  Medication Sig  . aspirin 81 MG tablet Take 81 mg by mouth daily.   . cetirizine (ZYRTEC) 10 MG tablet Take 10 mg by mouth as needed for allergies.  . IBU 600 MG tablet Take 600 mg by mouth every 6 (six) hours as needed for pain.  Marland Kitchen lamoTRIgine (LAMICTAL) 150 MG tablet Take 150 mg by mouth at bedtime.   Marland Kitchen levothyroxine (SYNTHROID, LEVOTHROID) 50 MCG tablet Take 50 mcg by mouth daily before breakfast.      The portal sites are clean dry and intact, the sutures were removed.  The calf was supple soft and the Homans sign was negative.  The patient is regaining knee flexion  Physical therapy will be started 1-2 VISITS  Status post RIGHT knee arthroscopy  Encounter Diagnoses  Name Primary?  Marland Kitchen Aftercare following surgery Yes  . S/P right knee arthroscopy      Follow-up in  3-4 WEEKS

## 2016-07-24 ENCOUNTER — Ambulatory Visit (INDEPENDENT_AMBULATORY_CARE_PROVIDER_SITE_OTHER): Payer: Self-pay | Admitting: Orthopedic Surgery

## 2016-07-24 DIAGNOSIS — Z9889 Other specified postprocedural states: Secondary | ICD-10-CM

## 2016-07-24 DIAGNOSIS — Z4889 Encounter for other specified surgical aftercare: Secondary | ICD-10-CM

## 2016-07-24 NOTE — Progress Notes (Signed)
Chief Complaint  Patient presents with  . Follow-up    Rt knee post OP DOS 06/28/16    Postoperative visit the patient only complains of some soreness and occasional swelling at the end of the day  She has regained her full range of motion she has swelling and no peripheral edema she has a trace joint effusion  We recommend ice as needed follow-up as needed if any mechanical symptoms return she is to call the office and schedule an appointment.

## 2016-07-28 ENCOUNTER — Telehealth: Payer: Self-pay | Admitting: Orthopedic Surgery

## 2016-07-28 ENCOUNTER — Other Ambulatory Visit: Payer: Self-pay | Admitting: *Deleted

## 2016-07-28 MED ORDER — IBU 600 MG PO TABS: 600.0000 mg | ORAL_TABLET | Freq: Four times a day (QID) | ORAL | 0 refills | Status: DC | PRN

## 2016-07-28 NOTE — Telephone Encounter (Signed)
Patient requests refill  IBU 600 MG tablet 30 tablet  - [facility]

## 2016-07-28 NOTE — Telephone Encounter (Signed)
Refilled

## 2017-01-08 ENCOUNTER — Encounter (INDEPENDENT_AMBULATORY_CARE_PROVIDER_SITE_OTHER): Payer: Self-pay | Admitting: *Deleted

## 2017-01-22 ENCOUNTER — Encounter (INDEPENDENT_AMBULATORY_CARE_PROVIDER_SITE_OTHER): Payer: Self-pay

## 2017-01-22 ENCOUNTER — Encounter (INDEPENDENT_AMBULATORY_CARE_PROVIDER_SITE_OTHER): Payer: Self-pay | Admitting: Internal Medicine

## 2017-01-23 ENCOUNTER — Telehealth: Payer: Self-pay | Admitting: Radiology

## 2017-01-23 ENCOUNTER — Encounter: Payer: Self-pay | Admitting: Orthopedic Surgery

## 2017-01-23 ENCOUNTER — Ambulatory Visit (INDEPENDENT_AMBULATORY_CARE_PROVIDER_SITE_OTHER): Payer: Medicare Other

## 2017-01-23 ENCOUNTER — Ambulatory Visit (INDEPENDENT_AMBULATORY_CARE_PROVIDER_SITE_OTHER): Payer: Medicare Other | Admitting: Orthopedic Surgery

## 2017-01-23 VITALS — BP 137/80 | HR 60 | Ht 66.0 in | Wt 181.0 lb

## 2017-01-23 DIAGNOSIS — G8929 Other chronic pain: Secondary | ICD-10-CM

## 2017-01-23 DIAGNOSIS — M25561 Pain in right knee: Secondary | ICD-10-CM

## 2017-01-23 DIAGNOSIS — M1711 Unilateral primary osteoarthritis, right knee: Secondary | ICD-10-CM

## 2017-01-23 DIAGNOSIS — M25461 Effusion, right knee: Secondary | ICD-10-CM

## 2017-01-23 NOTE — Patient Instructions (Addendum)

## 2017-01-23 NOTE — Telephone Encounter (Signed)
Patient wants to schedule surgery for Nov 29th please, can you post ?

## 2017-01-23 NOTE — Progress Notes (Signed)
Chief Complaint  Patient presents with  . Knee Pain    right knee has gotten worse since Friday      71 year old female had an arthroscopy of the knee may have 2018 presents with 2-3-day history of increased pain swelling in the same right knee.  She complains constant dull aching pain with decreased range of motion.  She did wisely stay off her feet which helped the swelling but she still has discomfort in not improving     Past Medical History:  Diagnosis Date  . Arthritis   . Asthma   . Chest pain    w abnormal ECg  . Chronic headaches    migraine  . Dyslipidemia   . Dyspnea   . Glucose intolerance (impaired glucose tolerance)   . Hemorrhoids 03/25/2015  . Hypothyroidism   . Seizure disorder ([place])   . Seizures ([place])   . Sleep apnea   . Varicose veins 03/25/2015  . Venous insufficiency    BP 137/80   Pulse 60   Ht 5\' 6"  (1.676 m)   Wt 181 lb (82.1 kg)   BMI 29.21 kg/m  She is awake alert and oriented x3 well-developed well-nourished mood Pleasant affect normal gait shows significant limp  Inspection of the right knee shows that she flexes 120 has 10 degree lack of extension knee is stable motor exam is normal skin is warm dry and intact no erythema no rash neurovascular exam is intact  X-ray shows severe arthritis medial compartment varus alignment  Prior operative note PROCEDURE:  Procedure(s): KNEE ARTHROSCOPY WITH MEDIAL MENISECTOMY (Right)   There is what we found #1 mild to moderate synovitis all 3 compartments #2 torn medial meniscus involving the posterior horn and body #3 grade 2 diffuse chondral malacia medial femoral condyle #4 grade 3 lateral facet chondromalacia patella #5 diffuse grade 2 fibrillation lateral femoral condyle   Plan aspiration injection  Procedure note injection and aspiration right knee joint  Verbal consent was obtained to aspirate and inject the right knee joint   Timeout was completed to confirm the site of aspiration and  injection  An 18-gauge needle was used to aspirate the knee joint from a suprapatellar lateral approach.  The medications used were 40 mg of Depo-Medrol and 1% lidocaine 3 cc  Anesthesia was provided by ethyl chloride and the skin was prepped with alcohol.  After cleaning the skin with alcohol an 18-gauge needle was used to aspirate the right knee joint.  We obtained 30 cc of fluid, CLEAR  We follow this by injection of 40 mg of Depo-Medrol and 3 cc 1% lidocaine.  There were no complications. A sterile bandage was applied.    Follow-up visit will be scheduled to reassess the situation patient will be a candidate for knee replacement surgery

## 2017-01-24 NOTE — Telephone Encounter (Signed)
Ok to schedule for Dec 4th.

## 2017-01-24 NOTE — Telephone Encounter (Signed)
I can do dec 4th NOT nov 29   No totals on thursdays

## 2017-01-25 ENCOUNTER — Other Ambulatory Visit: Payer: Self-pay | Admitting: Orthopedic Surgery

## 2017-01-25 ENCOUNTER — Telehealth: Payer: Self-pay | Admitting: Radiology

## 2017-01-25 NOTE — Telephone Encounter (Signed)
Yes I scheduled but its not in the book

## 2017-01-25 NOTE — Progress Notes (Signed)
Cbc added

## 2017-01-25 NOTE — Telephone Encounter (Signed)
I called [employer] to get approval for the TKA this is pending review case number Ref # is [id number]

## 2017-01-30 ENCOUNTER — Other Ambulatory Visit: Payer: Self-pay | Admitting: Orthopedic Surgery

## 2017-01-30 DIAGNOSIS — M1711 Unilateral primary osteoarthritis, right knee: Secondary | ICD-10-CM

## 2017-01-30 NOTE — Telephone Encounter (Signed)
Thanks she is aware of the surgery time and the preop times faxed CPM and HHPT< I am waiting on the prior authorization of the surgical procedure. Have faxed additional clinical information

## 2017-01-31 ENCOUNTER — Encounter (INDEPENDENT_AMBULATORY_CARE_PROVIDER_SITE_OTHER): Payer: Self-pay | Admitting: *Deleted

## 2017-01-31 ENCOUNTER — Ambulatory Visit (INDEPENDENT_AMBULATORY_CARE_PROVIDER_SITE_OTHER): Payer: Medicare Other | Admitting: Internal Medicine

## 2017-01-31 ENCOUNTER — Telehealth (INDEPENDENT_AMBULATORY_CARE_PROVIDER_SITE_OTHER): Payer: Self-pay | Admitting: *Deleted

## 2017-01-31 ENCOUNTER — Encounter (INDEPENDENT_AMBULATORY_CARE_PROVIDER_SITE_OTHER): Payer: Self-pay | Admitting: Internal Medicine

## 2017-01-31 ENCOUNTER — Telehealth: Payer: Self-pay | Admitting: Radiology

## 2017-01-31 VITALS — BP 144/70 | HR 64 | Temp 97.8°F | Ht 65.0 in | Wt 179.4 lb

## 2017-01-31 DIAGNOSIS — K5900 Constipation, unspecified: Secondary | ICD-10-CM

## 2017-01-31 DIAGNOSIS — Z1211 Encounter for screening for malignant neoplasm of colon: Secondary | ICD-10-CM | POA: Insufficient documentation

## 2017-01-31 DIAGNOSIS — R748 Abnormal levels of other serum enzymes: Secondary | ICD-10-CM

## 2017-01-31 DIAGNOSIS — Z13820 Encounter for screening for osteoporosis: Secondary | ICD-10-CM | POA: Insufficient documentation

## 2017-01-31 DIAGNOSIS — Z1321 Encounter for screening for nutritional disorder: Secondary | ICD-10-CM | POA: Insufficient documentation

## 2017-01-31 LAB — HEPATIC FUNCTION PANEL
AG Ratio: 1.9 (calc) (ref 1.0–2.5)
ALBUMIN MSPROF: 4.2 g/dL (ref 3.6–5.1)
ALT: 21 U/L (ref 6–29)
AST: 22 U/L (ref 10–35)
Alkaline phosphatase (APISO): 250 U/L — ABNORMAL HIGH (ref 33–130)
BILIRUBIN DIRECT: 0.2 mg/dL (ref 0.0–0.2)
Globulin: 2.2 g/dL (calc) (ref 1.9–3.7)
Indirect Bilirubin: 0.6 mg/dL (calc) (ref 0.2–1.2)
Total Bilirubin: 0.8 mg/dL (ref 0.2–1.2)
Total Protein: 6.4 g/dL (ref 6.1–8.1)

## 2017-01-31 MED ORDER — PEG 3350-KCL-NA BICARB-NACL 420 G PO SOLR: 4000.0000 mL | Freq: Once | ORAL | 0 refills | Status: AC

## 2017-01-31 MED ORDER — LUBIPROSTONE 24 MCG PO CAPS
24.0000 ug | ORAL_CAPSULE | Freq: Two times a day (BID) | ORAL | 5 refills | Status: DC
Start: 2017-01-31 — End: 2017-04-05

## 2017-01-31 NOTE — Telephone Encounter (Signed)
Patient needs trilyte 

## 2017-01-31 NOTE — Telephone Encounter (Signed)
Patients insurance has indicated they will approve the surgery at outpatient only/ 23 hour observation. To you FYI   Can we discuss? I have a query

## 2017-01-31 NOTE — Progress Notes (Addendum)
Subjective:    Patient ID: Isabella Holmes, female    DOB: 12-Aug-1945, 71 y.o.   MRN: [id number]  HPI Referred by Dr. Sudie Bailey for constipation. She also needs a colonoscopy. Her last colonoscopy unknown at [facility]. She says she cannot have a BM with a laxative. She has a BM about once a week.  She has lost 86 pounds which was intentional. She is drinking a lot of fruits and vegetables. She says she just doesn't have the urge to have a BM.  Also has tried stool softeners. Denies any abdominal pain .  Colonoscopy 2008 Dr. Karilyn Cota: average risk: few scattered diverticula at sigmoid colon. External hemorrhoids. Otherwise normal exam Will have knee replacement 02/2017.  01/09/2017 ALP 237, AST 23,ALT 18, GGT 166.  No IV drugs.  No blood transfusions.   Review of Systems Past Medical History:  Diagnosis Date  . Arthritis   . Asthma   . Chest pain    w abnormal ECg  . Chronic headaches    migraine  . Dyslipidemia   . Dyspnea   . Glucose intolerance (impaired glucose tolerance)   . Hemorrhoids 03/25/2015  . Hypothyroidism   . Seizure disorder ([facility])   . Seizures ([facility])   . Sleep apnea   . Varicose veins 03/25/2015  . Venous insufficiency     Past Surgical History:  Procedure Laterality Date  . KNEE ARTHROSCOPY Left   . KNEE ARTHROSCOPY WITH MEDIAL MENISECTOMY Right 06/28/2016   Procedure: KNEE ARTHROSCOPY WITH MEDIAL MENISECTOMY;  Surgeon: Vickki Hearing, MD;  Location: [facility];  Service: Orthopedics;  Laterality: Right;  . left vein stripping    . status post bilateral tubaligation    . TONSILLECTOMY     many years ago  . TUBAL LIGATION      Allergies  Allergen Reactions  . Levofloxacin Other (See Comments)    Recommended by Dr due to family hx  . Penicillins     REACTION: rash Has patient had a PCN reaction causing immediate rash, facial/tongue/throat swelling, SOB or lightheadedness with hypotension: No Has patient had a PCN reaction causing severe rash involving mucus  membranes or skin necrosis: No Has patient had a PCN reaction that required hospitalization No Has patient had a PCN reaction occurring within the last 10 years: No If all of the above answers are "NO", then may proceed with Cephalosporin use.   . Streptomycin     REACTION: rash  . Sulfonamide Derivatives     REACTION: rash    Current Outpatient Medications on File Prior to Visit  Medication Sig Dispense Refill  . albuterol (PROVENTIL HFA;VENTOLIN HFA) 108 (90 Base) MCG/ACT inhaler Inhale 1-2 puffs into the lungs every 4 (four) hours as needed for wheezing or shortness of breath.    . cetirizine (ZYRTEC) 10 MG tablet Take 10 mg by mouth as needed for allergies.    Marland Kitchen ibuprofen (ADVIL,MOTRIN) 200 MG tablet Take 200 mg by mouth every 6 (six) hours as needed for mild pain or moderate pain.     Marland Kitchen lamoTRIgine (LAMICTAL) 150 MG tablet Take 150 mg by mouth at bedtime.     Marland Kitchen levothyroxine (SYNTHROID, LEVOTHROID) 50 MCG tablet Take 50 mcg by mouth daily before breakfast.      No current facility-administered medications on file prior to visit.         Objective:   Physical Exam Blood pressure (!) 144/70, pulse 64, temperature 97.8 F (36.6 C), height 5\' 5"  (1.651 m), weight 179  lb 6.4 oz (81.4 kg). Alert and oriented. Skin warm and dry. Oral mucosa is moist.   . Sclera anicteric, conjunctivae is pink. Thyroid not enlarged. No cervical lymphadenopathy. Lungs clear. Heart regular rate and rhythm.  Abdomen is soft. Bowel sounds are positive. No hepatomegaly. No abdominal masses felt. No tenderness.  No edema to lower extremities.           Assessment & Plan:  Constipation: Will start on amitiza BID.  Rx sent to her pharmacy. Drink plenty of fluids Samples of Amitiza given to patient.  Screening colonoscopy: The risks of bleeding, perforation and infection were reviewed with patient. Elevated liver enzymes: Has lost over 80 pounds recently which was intentional. Will get a Hepatic  function today.

## 2017-01-31 NOTE — Patient Instructions (Signed)
The risks of bleeding, perforation and infection were reviewed with patient.  

## 2017-02-05 ENCOUNTER — Telehealth (INDEPENDENT_AMBULATORY_CARE_PROVIDER_SITE_OTHER): Payer: Self-pay | Admitting: Internal Medicine

## 2017-02-05 DIAGNOSIS — R748 Abnormal levels of other serum enzymes: Secondary | ICD-10-CM

## 2017-02-05 NOTE — Telephone Encounter (Signed)
the approval was sent over Ref # [id number] good 02/13/17 until 05/14/17   This is for OP/ 23 hour, any additional time in the hospital, will need to be requested by the hospital staff.   I want to know if I need to let anyone at the hospital know.

## 2017-02-05 NOTE — Telephone Encounter (Signed)
Iso enzymes ordered

## 2017-02-06 ENCOUNTER — Telehealth: Payer: Self-pay | Admitting: Radiology

## 2017-02-06 ENCOUNTER — Ambulatory Visit: Payer: Medicare Other | Admitting: Orthopedic Surgery

## 2017-02-06 ENCOUNTER — Other Ambulatory Visit (INDEPENDENT_AMBULATORY_CARE_PROVIDER_SITE_OTHER): Payer: Self-pay | Admitting: Internal Medicine

## 2017-02-06 VITALS — BP 142/90 | HR 61 | Ht 65.0 in | Wt 179.0 lb

## 2017-02-06 DIAGNOSIS — M1711 Unilateral primary osteoarthritis, right knee: Secondary | ICD-10-CM

## 2017-02-06 NOTE — Telephone Encounter (Signed)
-----   Message from Carole Civil, MD sent at 02/06/2017 10:19 AM EST ----- Regarding: Preop workup with Dr. Laural Golden Check with his nurse   Can we proceed with tka ?

## 2017-02-06 NOTE — Telephone Encounter (Signed)
Called Dr Laural Golden left message for a call back

## 2017-02-06 NOTE — Telephone Encounter (Signed)
No

## 2017-02-06 NOTE — Telephone Encounter (Signed)
Allen Norris is out of the office and will let us know something tomorrow.

## 2017-02-06 NOTE — Progress Notes (Signed)
Chief Complaint  Patient presents with  . Follow-up    Recheck on right knee    71 year old female scheduled for total knee next week.  She went to see the GI doctor and some of her blood work came back with elevated LFTs  We will check with them to see if the surgery needs to be postponed other than that the patient has no new complaints.  She is comfortable with the surgery.  ROS   She denies any nausea vomiting, she has had some constipation.  History of liver disease.  Encounter Diagnosis  Name Primary?  . Primary osteoarthritis of right knee Yes    Right total knee arthroplasty scheduled for Tuesday, December 4

## 2017-02-07 NOTE — Patient Instructions (Signed)
Isabella Holmes  02/07/2017     @PREFPERIOPPHARMACY @   Your procedure is scheduled on 02/13/2017.  Report to [facility] at 8:15 A.M.  Call this number if you have problems the morning of surgery:  [phone]   Remember:  Do not eat food or drink liquids after midnight.  Take these medicines the morning of surgery with A SIP OF WATER Albuterol inhaler, Zyrtec, Lamictal, Synthroid   Do not wear jewelry, make-up or nail polish.  Do not wear lotions, powders, or perfumes, or deoderant.  Do not shave 48 hours prior to surgery.  Men may shave face and neck.  Do not bring valuables to the hospital.  [facility] is not responsible for any belongings or valuables.  Contacts, dentures or bridgework may not be worn into surgery.  Leave your suitcase in the car.  After surgery it may be brought to your room.  For patients admitted to the hospital, discharge time will be determined by your treatment team.  Patients discharged the day of surgery will not be allowed to drive home.    Please read over the following fact sheets that you were given. Surgical Site Infection Prevention and Anesthesia Post-op Instructions     PATIENT INSTRUCTIONS POST-ANESTHESIA  IMMEDIATELY FOLLOWING SURGERY:  Do not drive or operate machinery for the first twenty four hours after surgery.  Do not make any important decisions for twenty four hours after surgery or while taking narcotic pain medications or sedatives.  If you develop intractable nausea and vomiting or a severe headache please notify your doctor immediately.  FOLLOW-UP:  Please make an appointment with your surgeon as instructed. You do not need to follow up with anesthesia unless specifically instructed to do so.  WOUND CARE INSTRUCTIONS (if applicable):  Keep a dry clean dressing on the anesthesia/puncture wound site if there is drainage.  Once the wound has quit draining you may leave it open to air.  Generally you should leave the bandage  intact for twenty four hours unless there is drainage.  If the epidural site drains for more than 36-48 hours please call the anesthesia department.  QUESTIONS?:  Please feel free to call your physician or the hospital operator if you have any questions, and they will be happy to assist you.      Total Knee Replacement Total knee replacement is a procedure to replace the knee joint with an artificial (prosthetic) knee joint. The purpose of this surgery is to reduce knee pain and improve knee function. The prosthetic knee joint (prosthesis) is usually made of metal and plastic. It replaces parts of the thigh bone (femur), lower leg bone (tibia), and kneecap (patella) that are removed during the procedure. Tell a health care provider about:  Any allergies you have.  All medicines you are taking, including vitamins, herbs, eye drops, creams, and over-the-counter medicines.  Any problems you or family members have had with anesthetic medicines.  Any blood disorders you have.  Any surgeries you have had.  Any medical conditions you have.  Whether you are pregnant or may be pregnant. What are the risks? Generally, this is a safe procedure. However, problems may occur, including:  Infection.  Bleeding.  Allergic reactions to medicines.  Damage to other structures or organs.  Decreased range of motion of the knee.  Instability of the knee.  Loosening of the prosthetic joint.  Knee pain that does not go away (chronic pain).  What happens before the procedure?  Ask your  health care provider about: ? Changing or stopping your regular medicines. This is especially important if you are taking diabetes medicines or blood thinners. ? Taking medicines such as aspirin and ibuprofen. These medicines can thin your blood. Do not take these medicines before your procedure if your health care provider instructs you not to.  Have dental care and routine cleanings completed before your  procedure. Plan to not have dental work done for 3 months after your procedure. Germs from anywhere in your body, including your mouth, can travel to your new joint and infect it.  Follow instructions from your health care provider about eating or drinking restrictions.  Ask your health care provider how your surgical site will be marked or identified.  You may be given antibiotic medicine to help prevent infection.  If your health care provider prescribes physical therapy, do exercises as instructed.  Do not use any tobacco products, such as cigarettes, chewing tobacco, or e-cigarettes. If you need help quitting, ask your health care provider.  You may have a physical exam.  You may have tests, such as: ? X-rays. ? MRI. ? CT scan. ? Bone scans.  You may have a blood or urine sample taken.  Plan to have someone take you home after the procedure.  If you will be going home right after the procedure, plan to have someone with you for at least 24 hours. It is recommended that you have someone to help care for you for at least 4-6 weeks after your procedure. What happens during the procedure?  To reduce your risk of infection: ? Your health care team will wash or sanitize their hands. ? Your skin will be washed with soap.  An IV tube will be inserted into one of your veins.  You will be given one or more of the following: ? A medicine to help you relax (sedative). ? A medicine to numb the area (local anesthetic). ? A medicine to make you fall asleep (general anesthetic). ? A medicine that is injected into your spine to numb the area below and slightly above the injection site (spinal anesthetic). ? A medicine that is injected into an area of your body to numb everything below the injection site (regional anesthetic).  An incision will be made in your knee.  Damaged cartilage and bone will be removed from your femur, tibia, and patella.  Parts of the prosthesis (liners)will be  placed over the areas of bone and cartilage that were removed. A metal liner will be placed over your femur, and plastic liners will be placed over your tibia and the underside of your patella.  One or more small tubes (drains) may be placed near your incision to help drain extra fluid from your surgical site.  Your incision will be closed with stitches (sutures), skin glue, or adhesive strips. Medicine may be applied to your incision.  A bandage (dressing) will be placed over your incision. The procedure may vary among health care providers and hospitals. What happens after the procedure?  Your blood pressure, heart rate, breathing rate, and blood oxygen level will be monitored often until the medicines you were given have worn off.  You may continue to receive fluids and medicines through an IV tube.  You will have some pain. Pain medicines will be available to help you.  You may have fluid coming from one or more drains in your incision.  You may have to wear compression stockings. These stockings help to prevent blood clots  and reduce swelling in your legs.  You will be encouraged to move around as much as possible.  You may be given a continuous passive motion machine to use at home. You will be shown how to use this machine.  Do not drive for 24 hours if you received a sedative. This information is not intended to replace advice given to you by your health care provider. Make sure you discuss any questions you have with your health care provider. Document Released: 06/05/2000 Document Revised: 11/01/2015 Document Reviewed: 02/03/2015 Elsevier Interactive Patient Education  2017 [employer].

## 2017-02-07 NOTE — Telephone Encounter (Signed)
I spoke to [employer], she indicated she has ordered some labs and may order an [place] regarding the elevated LFT's, and this should not interfere with any planned surgical procedure.  To you FYI

## 2017-02-08 ENCOUNTER — Other Ambulatory Visit: Payer: Self-pay

## 2017-02-08 ENCOUNTER — Encounter (HOSPITAL_COMMUNITY): Payer: Self-pay

## 2017-02-08 ENCOUNTER — Encounter (HOSPITAL_COMMUNITY)
Admission: RE | Admit: 2017-02-08 | Discharge: 2017-02-08 | Disposition: A | Discharge status: Home / Self Care | Payer: Medicare Other | Source: Ambulatory Visit | Attending: Orthopedic Surgery | Admitting: Orthopedic Surgery

## 2017-02-08 ENCOUNTER — Ambulatory Visit (HOSPITAL_COMMUNITY)
Admission: RE | Admit: 2017-02-08 | Discharge: 2017-02-08 | Disposition: A | Discharge status: Home / Self Care | Payer: Medicare Other | Source: Ambulatory Visit | Attending: Orthopedic Surgery | Admitting: Orthopedic Surgery

## 2017-02-08 DIAGNOSIS — Z01818 Encounter for other preprocedural examination: Secondary | ICD-10-CM

## 2017-02-08 DIAGNOSIS — Z0183 Encounter for blood typing: Secondary | ICD-10-CM | POA: Diagnosis not present

## 2017-02-08 DIAGNOSIS — Z01812 Encounter for preprocedural laboratory examination: Secondary | ICD-10-CM | POA: Diagnosis not present

## 2017-02-08 DIAGNOSIS — M1711 Unilateral primary osteoarthritis, right knee: Secondary | ICD-10-CM | POA: Insufficient documentation

## 2017-02-08 LAB — CBC WITH DIFFERENTIAL/PLATELET
BASOS PCT: 1 %
Basophils Absolute: 0 10*3/uL (ref 0.0–0.1)
EOS ABS: 0.3 10*3/uL (ref 0.0–0.7)
EOS PCT: 5 %
HCT: 42.6 % (ref 36.0–46.0)
HEMOGLOBIN: 13.6 g/dL (ref 12.0–15.0)
LYMPHS ABS: 2.3 10*3/uL (ref 0.7–4.0)
Lymphocytes Relative: 41 %
MCH: 29.6 pg (ref 26.0–34.0)
MCHC: 31.9 g/dL (ref 30.0–36.0)
MCV: 92.6 fL (ref 78.0–100.0)
MONOS PCT: 7 %
Monocytes Absolute: 0.4 10*3/uL (ref 0.1–1.0)
NEUTROS PCT: 46 %
Neutro Abs: 2.5 10*3/uL (ref 1.7–7.7)
PLATELETS: 223 10*3/uL (ref 150–400)
RBC: 4.6 MIL/uL (ref 3.87–5.11)
RDW: 13.4 % (ref 11.5–15.5)
WBC: 5.6 10*3/uL (ref 4.0–10.5)

## 2017-02-08 LAB — SURGICAL PCR SCREEN
MRSA, PCR: NEGATIVE
STAPHYLOCOCCUS AUREUS: NEGATIVE

## 2017-02-08 LAB — COMPREHENSIVE METABOLIC PANEL
ALBUMIN: 4.1 g/dL (ref 3.5–5.0)
ALK PHOS: 277 U/L — AB (ref 38–126)
ALT: 27 U/L (ref 14–54)
ANION GAP: 5 (ref 5–15)
AST: 28 U/L (ref 15–41)
BUN: 15 mg/dL (ref 6–20)
CHLORIDE: 104 mmol/L (ref 101–111)
CO2: 30 mmol/L (ref 22–32)
Calcium: 9.6 mg/dL (ref 8.9–10.3)
Creatinine, Ser: 0.83 mg/dL (ref 0.44–1.00)
GFR calc non Af Amer: >60 mL/min (ref >60)
GLUCOSE: 88 mg/dL (ref 65–99)
POTASSIUM: 4.1 mmol/L (ref 3.5–5.1)
SODIUM: 139 mmol/L (ref 135–145)
Total Bilirubin: 0.7 mg/dL (ref 0.3–1.2)
Total Protein: 6.9 g/dL (ref 6.5–8.1)

## 2017-02-08 LAB — ABO/RH: ABO/RH(D): A POS

## 2017-02-08 LAB — PROTIME-INR
INR: 0.91
PROTHROMBIN TIME: 12.2 s (ref 11.4–15.2)

## 2017-02-08 LAB — PREPARE RBC (CROSSMATCH)

## 2017-02-09 LAB — ALKALINE PHOSPHATASE ISOENZYMES
Alkaline phosphatase (APISO): 280 U/L — ABNORMAL HIGH (ref 33–130)
BONE ISOENZYMES (ALP ISO): 8 % — AB (ref 28–66)
Intestinal Isoenzymes: 0 % — ABNORMAL LOW (ref 1–24)
LIVER ISOENZYMES (ALP ISO): 72 % — AB (ref 25–69)
MACROHEPATIC ISOENZYMES: 20 % — AB

## 2017-02-12 NOTE — H&P (Signed)
TOTAL KNEE ADMISSION H&P  Patient is being admitted for right total knee arthroplasty.  Subjective:  Chief Complaint:right knee pain.  HPI: Isabella Holmes, 71 y.o. female, presents for right total knee.  She had knee arthroscopy and early 2018 for arthritis and torn medial meniscus.  She did well for several months but then started having increased pain.  She did receive a cortisone injection which did not relieve her symptoms and she continued complaint of dull aching nonradiating pain decreased range of motion and swelling right knee with associated complaints of difficulties with activities of daily living.  She has opted for knee replacement surgery not wanting to continue with nonoperative treatment  Patient Active Problem List   Diagnosis Date Noted  . Encounter for screening colonoscopy 01/31/2017  . Derangement of posterior horn of medial meniscus of right knee   . Varicose veins 03/25/2015  . Hemorrhoids 03/25/2015  . Vulvar abscess 02/12/2015  . OVERWEIGHT 01/06/2009  . ANXIETY STATE, UNSPECIFIED 01/06/2009  . HYPERTENSION 01/06/2009  . ESOPHAGEAL REFLUX 01/06/2009  . SEIZURE DISORDER 01/06/2009  . HYPERSOMNIA WITH SLEEP APNEA UNSPECIFIED 01/06/2009  . UNSPECIFIED SLEEP APNEA 01/06/2009  . OTHER CHEST PAIN 01/06/2009   Past Medical History:  Diagnosis Date  . Arthritis   . Asthma   . Chest pain    w abnormal ECg  . Chronic headaches    migraine  . Dyslipidemia   . Dyspnea   . Glucose intolerance (impaired glucose tolerance)   . Hemorrhoids 03/25/2015  . Hypothyroidism   . Seizure disorder ([place])    on Lamictal; has not had seizure in 15 years  . Seizures ([place])   . Sleep apnea    lost weight and does not need CPAP any more.  . Varicose veins 03/25/2015  . Venous insufficiency     Past Surgical History:  Procedure Laterality Date  . KNEE ARTHROSCOPY Left   . KNEE ARTHROSCOPY WITH MEDIAL MENISECTOMY Right 06/28/2016   Procedure: KNEE ARTHROSCOPY WITH MEDIAL  MENISECTOMY;  Surgeon: Carole Civil, MD;  Location: [facility];  Service: Orthopedics;  Laterality: Right;  . left vein stripping    . status post bilateral tubaligation    . TONSILLECTOMY     many years ago  . TUBAL LIGATION      No current facility-administered medications for this encounter.    Current Outpatient Medications  Medication Sig Dispense Refill Last Dose  . albuterol (PROVENTIL HFA;VENTOLIN HFA) 108 (90 Base) MCG/ACT inhaler Inhale 1-2 puffs into the lungs every 4 (four) hours as needed for wheezing or shortness of breath.   Taking  . cetirizine (ZYRTEC) 10 MG tablet Take 10 mg by mouth as needed for allergies.   Taking  . ibuprofen (ADVIL,MOTRIN) 200 MG tablet Take 200 mg by mouth every 6 (six) hours as needed for mild pain or moderate pain.    Taking  . lamoTRIgine (LAMICTAL) 150 MG tablet Take 150 mg by mouth at bedtime.    Taking  . levothyroxine (SYNTHROID, LEVOTHROID) 50 MCG tablet Take 50 mcg by mouth daily before breakfast.    Taking  . lubiprostone (AMITIZA) 24 MCG capsule Take 1 capsule (24 mcg total) by mouth 2 (two) times daily with a meal. (Patient not taking: Reported on 02/06/2017) 60 capsule 5 Not Taking   Allergies  Allergen Reactions  . Levofloxacin Other (See Comments)    Recommended by Dr due to family hx  . Penicillins     REACTION: rash Has patient had a PCN  reaction causing immediate rash, facial/tongue/throat swelling, SOB or lightheadedness with hypotension: No Has patient had a PCN reaction causing severe rash involving mucus membranes or skin necrosis: No Has patient had a PCN reaction that required hospitalization No Has patient had a PCN reaction occurring within the last 10 years: No If all of the above answers are "NO", then may proceed with Cephalosporin use.   . Streptomycin     REACTION: rash  . Sulfonamide Derivatives     REACTION: rash    Social History   Tobacco Use  . Smoking status: Former Smoker    Packs/day: 2.50     Years: 20.00    Pack years: 50.00    Types: Cigarettes    Last attempt to quit: 06/23/1986    Years since quitting: 30.6  . Smokeless tobacco: Never Used  Substance Use Topics  . Alcohol use: No    Family History  Problem Relation Age of Onset  . Heart attack Mother   . Tuberculosis Father   . Heart disease Father        cardiac disease  . Cirrhosis Father   . Factor V Leiden deficiency Other   . Seizures Son      Review of Systems  Respiratory: Negative for shortness of breath.   Cardiovascular: Negative for chest pain.  Musculoskeletal: Negative for back pain.  All other systems reviewed and are negative.   Objective:  Physical Exam  Constitutional: She is oriented to person, place, and time. She appears well-developed and well-nourished. No distress.  HENT:  Head: Normocephalic and atraumatic.  Eyes: Pupils are equal, round, and reactive to light. Right eye exhibits no discharge. Left eye exhibits no discharge. No scleral icterus.  Neck: Normal range of motion. Neck supple. No JVD present. No tracheal deviation present. No thyromegaly present.  Cardiovascular: Normal rate and intact distal pulses.  Respiratory: Effort normal. No stridor.  GI: Soft. She exhibits no distension.  Musculoskeletal:       Right shoulder: Normal.       Left shoulder: Normal.       Right hip: Normal.       Left hip: Normal.       Left knee: Normal.       Right upper arm: Normal.       Left upper arm: Normal.       Legs: Gait abnormal she favors the right lower extremity and knee  Lymphadenopathy:    She has no cervical adenopathy.  Neurological: She is alert and oriented to person, place, and time. She has normal reflexes. She exhibits normal muscle tone. Coordination normal.  Skin: Skin is warm and dry. No rash noted. She is not diaphoretic. No erythema. No pallor.  Psychiatric: She has a normal mood and affect. Her behavior is normal. Thought content normal.    Vital signs in last  24 hours:    Labs:   Estimated body mass index is 29.79 kg/m as calculated from the following:   Height as of 02/08/17: 5\' 5"  (1.651 m).   Weight as of 02/08/17: 179 lb (81.2 kg).   Imaging Review Plain radiographs demonstrate joint alignment varus moderate joint space narrowing medial compartment bone-on-bone contact minimal peripheral osteophytes no sclerosis  Bone quality adequate  Assessment/Plan:  End stage arthritis, right knee //right total knee arthroplasty  The patient history, physical examination, clinical judgment of the provider and imaging studies are consistent with end stage degenerative joint disease of the right knee(s) and total knee  arthroplasty is deemed medically necessary. The treatment options including medical management, injection therapy arthroscopy and arthroplasty were discussed at length. The risks and benefits of total knee arthroplasty were presented and reviewed. The risks due to aseptic loosening, infection, stiffness, patella tracking problems, thromboembolic complications and other imponderables were discussed. The patient acknowledged the explanation, agreed to proceed with the plan and consent was signed. Patient is being admitted for inpatient treatment for surgery, pain control, PT, OT, prophylactic antibiotics, VTE prophylaxis, progressive ambulation and ADL's and discharge planning. The patient is planning to be discharged home with home health services

## 2017-02-13 ENCOUNTER — Other Ambulatory Visit: Payer: Self-pay

## 2017-02-13 ENCOUNTER — Ambulatory Visit (HOSPITAL_COMMUNITY): Payer: Medicare Other

## 2017-02-13 ENCOUNTER — Encounter (HOSPITAL_COMMUNITY)
Admission: RE | Disposition: A | Discharge status: Home Health | Payer: Self-pay | Source: Ambulatory Visit | Attending: Orthopedic Surgery

## 2017-02-13 ENCOUNTER — Encounter (HOSPITAL_COMMUNITY): Payer: Self-pay

## 2017-02-13 ENCOUNTER — Encounter: Payer: Self-pay | Admitting: Orthopedic Surgery

## 2017-02-13 ENCOUNTER — Ambulatory Visit (HOSPITAL_COMMUNITY): Payer: Medicare Other | Admitting: Certified Registered Nurse Anesthetist

## 2017-02-13 ENCOUNTER — Inpatient Hospital Stay (HOSPITAL_COMMUNITY)
Admission: RE | Admit: 2017-02-13 | Discharge: 2017-02-15 | DRG: 470 | Disposition: A | Discharge status: Home Health | Payer: Medicare Other | Source: Ambulatory Visit | Attending: Orthopedic Surgery | Admitting: Orthopedic Surgery

## 2017-02-13 DIAGNOSIS — J45909 Unspecified asthma, uncomplicated: Secondary | ICD-10-CM | POA: Diagnosis present

## 2017-02-13 DIAGNOSIS — I10 Essential (primary) hypertension: Secondary | ICD-10-CM | POA: Diagnosis present

## 2017-02-13 DIAGNOSIS — Z880 Allergy status to penicillin: Secondary | ICD-10-CM

## 2017-02-13 DIAGNOSIS — Z87891 Personal history of nicotine dependence: Secondary | ICD-10-CM | POA: Diagnosis not present

## 2017-02-13 DIAGNOSIS — G4730 Sleep apnea, unspecified: Secondary | ICD-10-CM | POA: Diagnosis present

## 2017-02-13 DIAGNOSIS — Z79899 Other long term (current) drug therapy: Secondary | ICD-10-CM

## 2017-02-13 DIAGNOSIS — Z820 Family history of epilepsy and other diseases of the nervous system: Secondary | ICD-10-CM

## 2017-02-13 DIAGNOSIS — Z882 Allergy status to sulfonamides status: Secondary | ICD-10-CM

## 2017-02-13 DIAGNOSIS — M1711 Unilateral primary osteoarthritis, right knee: Secondary | ICD-10-CM | POA: Diagnosis present

## 2017-02-13 DIAGNOSIS — G43909 Migraine, unspecified, not intractable, without status migrainosus: Secondary | ICD-10-CM | POA: Diagnosis present

## 2017-02-13 DIAGNOSIS — Z881 Allergy status to other antibiotic agents status: Secondary | ICD-10-CM

## 2017-02-13 DIAGNOSIS — Z79890 Hormone replacement therapy (postmenopausal): Secondary | ICD-10-CM | POA: Diagnosis not present

## 2017-02-13 DIAGNOSIS — G40909 Epilepsy, unspecified, not intractable, without status epilepticus: Secondary | ICD-10-CM | POA: Diagnosis present

## 2017-02-13 DIAGNOSIS — E039 Hypothyroidism, unspecified: Secondary | ICD-10-CM | POA: Diagnosis present

## 2017-02-13 DIAGNOSIS — Z8249 Family history of ischemic heart disease and other diseases of the circulatory system: Secondary | ICD-10-CM

## 2017-02-13 DIAGNOSIS — K219 Gastro-esophageal reflux disease without esophagitis: Secondary | ICD-10-CM | POA: Diagnosis present

## 2017-02-13 DIAGNOSIS — Z96659 Presence of unspecified artificial knee joint: Secondary | ICD-10-CM

## 2017-02-13 DIAGNOSIS — M25561 Pain in right knee: Secondary | ICD-10-CM | POA: Diagnosis present

## 2017-02-13 DIAGNOSIS — Z9851 Tubal ligation status: Secondary | ICD-10-CM | POA: Diagnosis not present

## 2017-02-13 HISTORY — PX: TOTAL KNEE ARTHROPLASTY: SHX125

## 2017-02-13 SURGERY — ARTHROPLASTY, KNEE, TOTAL
Anesthesia: Spinal | Site: Knee | Laterality: Right

## 2017-02-13 MED ORDER — LACTATED RINGERS IV SOLN
INTRAVENOUS | Status: DC
  Administered 2017-02-13: 09:00:00 via INTRAVENOUS

## 2017-02-13 MED ORDER — ONDANSETRON HCL 4 MG/2ML IJ SOLN
4.0000 mg | Freq: Once | INTRAMUSCULAR | Status: AC
  Administered 2017-02-13: 4 mg via INTRAVENOUS
  Filled 2017-02-13: qty 2

## 2017-02-13 MED ORDER — HYDROMORPHONE HCL 1 MG/ML IJ SOLN
0.5000 mg | INTRAMUSCULAR | Status: DC | PRN
  Administered 2017-02-15: 0.5 mg via INTRAVENOUS
  Filled 2017-02-13: qty 1

## 2017-02-13 MED ORDER — DOCUSATE SODIUM 100 MG PO CAPS
100.0000 mg | ORAL_CAPSULE | Freq: Two times a day (BID) | ORAL | Status: DC
  Administered 2017-02-13 – 2017-02-15 (×5): 100 mg via ORAL
  Filled 2017-02-13 (×5): qty 1

## 2017-02-13 MED ORDER — LEVOTHYROXINE SODIUM 50 MCG PO TABS
50.0000 ug | ORAL_TABLET | Freq: Every day | ORAL | Status: DC
  Administered 2017-02-14 – 2017-02-15 (×2): 50 ug via ORAL
  Filled 2017-02-13 (×2): qty 1

## 2017-02-13 MED ORDER — GLYCOPYRROLATE 0.2 MG/ML IJ SOLN
INTRAMUSCULAR | Status: AC
  Filled 2017-02-13: qty 1

## 2017-02-13 MED ORDER — MIDAZOLAM HCL 2 MG/2ML IJ SOLN
INTRAMUSCULAR | Status: AC
  Filled 2017-02-13: qty 2

## 2017-02-13 MED ORDER — BISACODYL 5 MG PO TBEC: 5.0000 mg | DELAYED_RELEASE_TABLET | Freq: Every day | ORAL | Status: DC | PRN

## 2017-02-13 MED ORDER — KETOROLAC TROMETHAMINE 15 MG/ML IJ SOLN
7.5000 mg | Freq: Four times a day (QID) | INTRAMUSCULAR | Status: DC
  Filled 2017-02-13 (×3): qty 1

## 2017-02-13 MED ORDER — ONDANSETRON HCL 4 MG PO TABS: 4.0000 mg | ORAL_TABLET | Freq: Four times a day (QID) | ORAL | Status: DC | PRN

## 2017-02-13 MED ORDER — BUPIVACAINE LIPOSOME 1.3 % IJ SUSP
INTRAMUSCULAR | Status: AC
  Filled 2017-02-13: qty 20

## 2017-02-13 MED ORDER — DEXAMETHASONE SODIUM PHOSPHATE 10 MG/ML IJ SOLN
10.0000 mg | Freq: Once | INTRAMUSCULAR | Status: AC
  Administered 2017-02-14: 10 mg via INTRAVENOUS
  Filled 2017-02-13: qty 1

## 2017-02-13 MED ORDER — ONDANSETRON HCL 4 MG/2ML IJ SOLN
4.0000 mg | Freq: Four times a day (QID) | INTRAMUSCULAR | Status: DC | PRN
  Administered 2017-02-13: 4 mg via INTRAVENOUS
  Filled 2017-02-13: qty 2

## 2017-02-13 MED ORDER — SODIUM CHLORIDE 0.9 % IR SOLN
Status: DC | PRN
  Administered 2017-02-13: 3000 mL

## 2017-02-13 MED ORDER — HYDROCODONE-ACETAMINOPHEN 5-325 MG PO TABS
1.0000 | ORAL_TABLET | ORAL | Status: DC
  Administered 2017-02-13 – 2017-02-15 (×9): 1 via ORAL
  Filled 2017-02-13 (×9): qty 1

## 2017-02-13 MED ORDER — ONDANSETRON 4 MG PO TBDP
4.0000 mg | ORAL_TABLET | Freq: Once | ORAL | Status: AC
  Administered 2017-02-13: 4 mg via ORAL

## 2017-02-13 MED ORDER — SODIUM CHLORIDE 0.9 % IJ SOLN
INTRAMUSCULAR | Status: AC
  Filled 2017-02-13: qty 40

## 2017-02-13 MED ORDER — BUPIVACAINE-EPINEPHRINE (PF) 0.5% -1:200000 IJ SOLN
INTRAMUSCULAR | Status: AC
  Filled 2017-02-13: qty 30

## 2017-02-13 MED ORDER — POVIDONE-IODINE 10 % EX SWAB
2.0000 "application " | Freq: Once | CUTANEOUS | Status: AC
  Administered 2017-02-13: 2 via TOPICAL

## 2017-02-13 MED ORDER — FENTANYL CITRATE (PF) 100 MCG/2ML IJ SOLN: 25.0000 ug | INTRAMUSCULAR | Status: DC | PRN

## 2017-02-13 MED ORDER — LAMOTRIGINE 100 MG PO TABS
150.0000 mg | ORAL_TABLET | Freq: Every day | ORAL | Status: DC
  Administered 2017-02-13 – 2017-02-14 (×2): 150 mg via ORAL
  Filled 2017-02-13 (×2): qty 2

## 2017-02-13 MED ORDER — HYDROCODONE-ACETAMINOPHEN 7.5-325 MG PO TABS
1.0000 | ORAL_TABLET | Freq: Once | ORAL | Status: AC
  Administered 2017-02-13: 1 via ORAL
  Filled 2017-02-13: qty 1

## 2017-02-13 MED ORDER — LUBIPROSTONE 24 MCG PO CAPS
24.0000 ug | ORAL_CAPSULE | Freq: Two times a day (BID) | ORAL | Status: DC
  Administered 2017-02-13 – 2017-02-15 (×4): 24 ug via ORAL
  Filled 2017-02-13 (×4): qty 1

## 2017-02-13 MED ORDER — PHENOL 1.4 % MT LIQD: 1.0000 | OROMUCOSAL | Status: DC | PRN

## 2017-02-13 MED ORDER — ALBUTEROL SULFATE HFA 108 (90 BASE) MCG/ACT IN AERS: 1.0000 | INHALATION_SPRAY | RESPIRATORY_TRACT | Status: DC | PRN

## 2017-02-13 MED ORDER — CHLORHEXIDINE GLUCONATE 4 % EX LIQD: 60.0000 mL | Freq: Once | CUTANEOUS | Status: DC

## 2017-02-13 MED ORDER — BUPIVACAINE-EPINEPHRINE (PF) 0.5% -1:200000 IJ SOLN
INTRAMUSCULAR | Status: DC | PRN
Start: 2017-02-13 — End: 2017-02-13
  Administered 2017-02-13: 30 mL via PERINEURAL

## 2017-02-13 MED ORDER — METOCLOPRAMIDE HCL 10 MG PO TABS
5.0000 mg | ORAL_TABLET | Freq: Three times a day (TID) | ORAL | Status: DC | PRN
Start: 2017-02-13 — End: 2017-02-15

## 2017-02-13 MED ORDER — ALBUTEROL SULFATE (2.5 MG/3ML) 0.083% IN NEBU: 2.5000 mg | INHALATION_SOLUTION | RESPIRATORY_TRACT | Status: DC | PRN

## 2017-02-13 MED ORDER — HYDROCODONE-ACETAMINOPHEN 7.5-325 MG PO TABS: 2.0000 | ORAL_TABLET | ORAL | Status: DC | PRN

## 2017-02-13 MED ORDER — ALUM & MAG HYDROXIDE-SIMETH 200-200-20 MG/5ML PO SUSP: 30.0000 mL | ORAL | Status: DC | PRN

## 2017-02-13 MED ORDER — ACETAMINOPHEN 650 MG RE SUPP: 650.0000 mg | RECTAL | Status: DC | PRN

## 2017-02-13 MED ORDER — KETOROLAC TROMETHAMINE 30 MG/ML IJ SOLN: 30.0000 mg | Freq: Once | INTRAMUSCULAR | Status: DC

## 2017-02-13 MED ORDER — SODIUM CHLORIDE 0.9 % IV SOLN
INTRAVENOUS | Status: DC
  Administered 2017-02-13 (×2): via INTRAVENOUS

## 2017-02-13 MED ORDER — VANCOMYCIN HCL IN DEXTROSE 1-5 GM/200ML-% IV SOLN
1000.0000 mg | INTRAVENOUS | Status: AC
  Administered 2017-02-13: 1000 mg via INTRAVENOUS
  Filled 2017-02-13: qty 200

## 2017-02-13 MED ORDER — MAGNESIUM CITRATE PO SOLN
1.0000 | Freq: Once | ORAL | Status: DC | PRN
Start: 2017-02-13 — End: 2017-02-15

## 2017-02-13 MED ORDER — FENTANYL CITRATE (PF) 100 MCG/2ML IJ SOLN
INTRAMUSCULAR | Status: AC
  Filled 2017-02-13: qty 2

## 2017-02-13 MED ORDER — VANCOMYCIN HCL IN DEXTROSE 1-5 GM/200ML-% IV SOLN
1000.0000 mg | Freq: Two times a day (BID) | INTRAVENOUS | Status: AC
  Administered 2017-02-13: 1000 mg via INTRAVENOUS
  Filled 2017-02-13: qty 200

## 2017-02-13 MED ORDER — PREGABALIN 50 MG PO CAPS
50.0000 mg | ORAL_CAPSULE | Freq: Once | ORAL | Status: AC
  Administered 2017-02-13: 50 mg via ORAL
  Filled 2017-02-13: qty 1

## 2017-02-13 MED ORDER — DIPHENHYDRAMINE HCL 12.5 MG/5ML PO ELIX: 12.5000 mg | ORAL_SOLUTION | ORAL | Status: DC | PRN

## 2017-02-13 MED ORDER — BUPIVACAINE IN DEXTROSE 0.75-8.25 % IT SOLN
INTRATHECAL | Status: DC | PRN
  Administered 2017-02-13: 15 mg via INTRATHECAL

## 2017-02-13 MED ORDER — METOCLOPRAMIDE HCL 5 MG/ML IJ SOLN
5.0000 mg | Freq: Three times a day (TID) | INTRAMUSCULAR | Status: DC | PRN
Start: 2017-02-13 — End: 2017-02-15

## 2017-02-13 MED ORDER — OXYCODONE HCL 5 MG/5ML PO SOLN: 5.0000 mg | Freq: Once | ORAL | Status: DC | PRN

## 2017-02-13 MED ORDER — MIDAZOLAM HCL 5 MG/5ML IJ SOLN
INTRAMUSCULAR | Status: DC | PRN
  Administered 2017-02-13: 1 mg via INTRAVENOUS
  Administered 2017-02-13 (×2): .5 mg via INTRAVENOUS

## 2017-02-13 MED ORDER — TRANEXAMIC ACID 1000 MG/10ML IV SOLN
1000.0000 mg | INTRAVENOUS | Status: AC
  Administered 2017-02-13: 1000 mg via INTRAVENOUS
  Filled 2017-02-13: qty 10

## 2017-02-13 MED ORDER — METHOCARBAMOL 1000 MG/10ML IJ SOLN
500.0000 mg | Freq: Four times a day (QID) | INTRAVENOUS | Status: DC | PRN
  Administered 2017-02-13: 500 mg via INTRAVENOUS
  Filled 2017-02-13: qty 550

## 2017-02-13 MED ORDER — SODIUM CHLORIDE 0.9 % IV SOLN
INTRAVENOUS | Status: DC | PRN
  Administered 2017-02-13: 60 mL

## 2017-02-13 MED ORDER — EPINEPHRINE PF 1 MG/10ML IJ SOSY
PREFILLED_SYRINGE | INTRAMUSCULAR | Status: DC | PRN
  Administered 2017-02-13: .1 ug via INTRATRACHEAL

## 2017-02-13 MED ORDER — METHOCARBAMOL 500 MG PO TABS
500.0000 mg | ORAL_TABLET | Freq: Four times a day (QID) | ORAL | Status: DC | PRN
  Filled 2017-02-13: qty 1

## 2017-02-13 MED ORDER — PROPOFOL 500 MG/50ML IV EMUL
INTRAVENOUS | Status: DC | PRN
  Administered 2017-02-13: 25 ug/kg/min via INTRAVENOUS

## 2017-02-13 MED ORDER — OXYCODONE HCL 5 MG PO TABS: 5.0000 mg | ORAL_TABLET | Freq: Once | ORAL | Status: DC | PRN

## 2017-02-13 MED ORDER — 0.9 % SODIUM CHLORIDE (POUR BTL) OPTIME
TOPICAL | Status: DC | PRN
  Administered 2017-02-13: 1000 mL

## 2017-02-13 MED ORDER — FENTANYL CITRATE (PF) 100 MCG/2ML IJ SOLN
INTRAMUSCULAR | Status: DC | PRN
  Administered 2017-02-13: 15 ug via INTRATHECAL
  Administered 2017-02-13 (×2): 12.5 ug via INTRAVENOUS
  Administered 2017-02-13: 50 ug via INTRAVENOUS
  Administered 2017-02-13: 10 ug via INTRAVENOUS

## 2017-02-13 MED ORDER — ASPIRIN EC 325 MG PO TBEC
325.0000 mg | DELAYED_RELEASE_TABLET | Freq: Every day | ORAL | Status: DC
  Administered 2017-02-14 – 2017-02-15 (×2): 325 mg via ORAL
  Filled 2017-02-13 (×2): qty 1

## 2017-02-13 MED ORDER — MENTHOL 3 MG MT LOZG: 1.0000 | LOZENGE | OROMUCOSAL | Status: DC | PRN

## 2017-02-13 MED ORDER — MIDAZOLAM HCL 2 MG/2ML IJ SOLN
1.0000 mg | Freq: Once | INTRAMUSCULAR | Status: AC | PRN
  Administered 2017-02-13: 2 mg via INTRAVENOUS

## 2017-02-13 MED ORDER — ONDANSETRON 4 MG PO TBDP
ORAL_TABLET | ORAL | Status: AC
  Filled 2017-02-13: qty 1

## 2017-02-13 MED ORDER — LORATADINE 10 MG PO TABS
10.0000 mg | ORAL_TABLET | Freq: Every day | ORAL | Status: DC
  Administered 2017-02-13 – 2017-02-15 (×3): 10 mg via ORAL
  Filled 2017-02-13 (×3): qty 1

## 2017-02-13 MED ORDER — ACETAMINOPHEN 325 MG PO TABS: 650.0000 mg | ORAL_TABLET | ORAL | Status: DC | PRN

## 2017-02-13 MED ORDER — SENNOSIDES-DOCUSATE SODIUM 8.6-50 MG PO TABS: 1.0000 | ORAL_TABLET | Freq: Every evening | ORAL | Status: DC | PRN

## 2017-02-13 SURGICAL SUPPLY — 68 items
BAG HAMPER (MISCELLANEOUS) ×3 IMPLANT
BANDAGE ELASTIC 4 LF NS (GAUZE/BANDAGES/DRESSINGS) ×6 IMPLANT
BANDAGE ELASTIC 6 LF NS (GAUZE/BANDAGES/DRESSINGS) ×3 IMPLANT
BANDAGE ESMARK 6X9 LF (GAUZE/BANDAGES/DRESSINGS) ×1 IMPLANT
BIT DRILL 3.2X128 (BIT) ×2 IMPLANT
BIT DRILL 3.2X128MM (BIT) ×1
BLADE HEX COATED 2.75 (ELECTRODE) ×3 IMPLANT
BLADE SAGITTAL 25.0X1.27X90 (BLADE) ×2 IMPLANT
BLADE SAGITTAL 25.0X1.27X90MM (BLADE) ×1
BNDG ESMARK 6X9 LF (GAUZE/BANDAGES/DRESSINGS) ×3
CAP KNEE TOTAL 3 SIGMA ×3 IMPLANT
CEMENT HV SMART SET (Cement) ×6 IMPLANT
CLOTH BEACON ORANGE TIMEOUT ST (SAFETY) ×3 IMPLANT
COOLER CRYO CUFF IC AND MOTOR (MISCELLANEOUS) ×3 IMPLANT
COVER LIGHT HANDLE STERIS (MISCELLANEOUS) ×6 IMPLANT
CUFF CRYO KNEE18X23 MED (MISCELLANEOUS) ×3 IMPLANT
CUFF TOURNIQUET SINGLE 34IN LL (TOURNIQUET CUFF) ×3 IMPLANT
DECANTER SPIKE VIAL GLASS SM (MISCELLANEOUS) ×3 IMPLANT
DRAPE BACK TABLE (DRAPES) ×3 IMPLANT
DRAPE EXTREMITY T 121X128X90 (DRAPE) ×3 IMPLANT
DRESSING AQUACEL AG ADV 3.5X12 (MISCELLANEOUS) ×1 IMPLANT
DRSG AQUACEL AG ADV 3.5X12 (MISCELLANEOUS) ×3
DRSG MEPILEX BORDER 4X12 (GAUZE/BANDAGES/DRESSINGS) ×3 IMPLANT
DURAPREP 26ML APPLICATOR (WOUND CARE) ×6 IMPLANT
ELECT REM PT RETURN 9FT ADLT (ELECTROSURGICAL) ×3
ELECTRODE REM PT RTRN 9FT ADLT (ELECTROSURGICAL) ×1 IMPLANT
EVACUATOR 3/16  PVC DRAIN (DRAIN) ×2
EVACUATOR 3/16 PVC DRAIN (DRAIN) ×1 IMPLANT
GLOVE BIO SURGEON STRL SZ7 (GLOVE) ×6 IMPLANT
GLOVE BIOGEL PI IND STRL 7.0 (GLOVE) ×2 IMPLANT
GLOVE BIOGEL PI INDICATOR 7.0 (GLOVE) ×4
GLOVE SKINSENSE NS SZ8.0 LF (GLOVE) ×4
GLOVE SKINSENSE STRL SZ8.0 LF (GLOVE) ×2 IMPLANT
GLOVE SS N UNI LF 8.5 STRL (GLOVE) ×3 IMPLANT
GOWN STRL REUS W/ TWL LRG LVL3 (GOWN DISPOSABLE) ×1 IMPLANT
GOWN STRL REUS W/TWL LRG LVL3 (GOWN DISPOSABLE) ×8 IMPLANT
GOWN STRL REUS W/TWL XL LVL3 (GOWN DISPOSABLE) ×3 IMPLANT
HANDPIECE INTERPULSE COAX TIP (DISPOSABLE) ×2
HOOD W/PEELAWAY (MISCELLANEOUS) ×12 IMPLANT
INST SET MAJOR BONE (KITS) ×3 IMPLANT
IV NS IRRIG 3000ML ARTHROMATIC (IV SOLUTION) ×3 IMPLANT
KIT BLADEGUARD II DBL (SET/KITS/TRAYS/PACK) ×3 IMPLANT
KIT ROOM TURNOVER APOR (KITS) ×3 IMPLANT
MANIFOLD NEPTUNE II (INSTRUMENTS) ×3 IMPLANT
MARKER SKIN DUAL TIP RULER LAB (MISCELLANEOUS) ×3 IMPLANT
NEEDLE HYPO 21X1.5 SAFETY (NEEDLE) ×3 IMPLANT
NS IRRIG 1000ML POUR BTL (IV SOLUTION) ×3 IMPLANT
PACK TOTAL JOINT (CUSTOM PROCEDURE TRAY) ×3 IMPLANT
PAD ARMBOARD 7.5X6 YLW CONV (MISCELLANEOUS) ×3 IMPLANT
PAD DANNIFLEX CPM (ORTHOPEDIC SUPPLIES) ×3 IMPLANT
PIN TROCAR 3 INCH (PIN) IMPLANT
SAW OSC TIP CART 19.5X105X1.3 (SAW) ×3 IMPLANT
SET BASIN LINEN APH (SET/KITS/TRAYS/PACK) ×3 IMPLANT
SET HNDPC FAN SPRY TIP SCT (DISPOSABLE) ×1 IMPLANT
STAPLER VISISTAT 35W (STAPLE) ×3 IMPLANT
SUT BRALON NAB BRD #1 30IN (SUTURE) ×6 IMPLANT
SUT MNCRL 0 VIOLET CTX 36 (SUTURE) ×1 IMPLANT
SUT MON AB 0 CT1 (SUTURE) ×3 IMPLANT
SUT MON AB 2-0 CT1 36 (SUTURE) IMPLANT
SUT MONOCRYL 0 CTX 36 (SUTURE) ×2
SYR 20CC LL (SYRINGE) ×9 IMPLANT
SYR 30ML LL (SYRINGE) ×3 IMPLANT
SYR BULB IRRIGATION 50ML (SYRINGE) ×3 IMPLANT
TOWEL OR 17X26 4PK STRL BLUE (TOWEL DISPOSABLE) ×3 IMPLANT
TOWER CARTRIDGE SMART MIX (DISPOSABLE) ×3 IMPLANT
TRAY FOLEY W/METER SILVER 16FR (SET/KITS/TRAYS/PACK) ×3 IMPLANT
WATER STERILE IRR 1000ML POUR (IV SOLUTION) ×6 IMPLANT
YANKAUER SUCT 12FT TUBE ARGYLE (SUCTIONS) ×3 IMPLANT

## 2017-02-13 NOTE — Interval H&P Note (Signed)
History and Physical Interval Note:  02/13/2017 10:49 AM  Isabella Holmes  has presented today for surgery, with the diagnosis of primary osteoarthritis right knee  The various methods of treatment have been discussed with the patient and family. After consideration of risks, benefits and other options for treatment, the patient has consented to  Procedure(s): TOTAL KNEE ARTHROPLASTY (Right) as a surgical intervention .  The patient's history has been reviewed, patient examined, no change in status, stable for surgery.  I have reviewed the patient's chart and labs.  Questions were answered to the patient's satisfaction.     [name]

## 2017-02-13 NOTE — Op Note (Signed)
02/13/2017  1:17 PM  PATIENT:  Isabella Holmes  71 y.o. female  PRE-OPERATIVE DIAGNOSIS:  primary osteoarthritis right knee  POST-OPERATIVE DIAGNOSIS:  primary osteoarthritis right knee  PROCEDURE:  Procedure(s): TOTAL KNEE ARTHROPLASTY (Right)   Depuy  4 narrow femur, 3 tibia, 10 mm PS polyethylene insert 9 x 35 patellar button  Severe arthritis medial compartment grade 4 grade 3 patellofemoral joint normal lateral compartment ACL PCL were intact  SURGEON:  Surgeon(s) and Role:    Carole Civil, MD - Primary  PHYSICIAN ASSISTANT:   ASSISTANTS: Marquita Palms   ANESTHESIA:   spinal  EBL:  50 mL   BLOOD ADMINISTERED:none  DRAINS: hemovac   LOCAL MEDICATIONS USED:  MARCAINE    and OTHER exparel 20/diluted with 40 cc saline   SPECIMEN:  No Specimen  DISPOSITION OF SPECIMEN:  N/A  COUNTS:  YES  TOURNIQUET:   Total Tourniquet Time Documented: Thigh (Right) - 69 minutes Total: Thigh (Right) - 69 minutes   DICTATION: .Viviann Spare Dictation  PLAN OF CARE: Admit to inpatient   PATIENT DISPOSITION:  PACU - hemodynamically stable.   Delay start of Pharmacological VTE agent (>24hrs) due to surgical blood loss or risk of bleeding: yes  Today's date  Date of surgery  Operative report for a right total knee arthroplasty  Preop diagnosis primary osteoarthritis right knee Postop diagnosis same Procedure right total knee arthroplasty Implants Depuy fixed-bearing posterior stabilized Sigma prosthesis  Surgeon Aline Brochure [phone]    The patient was identified in the preop holding area and the surgical site was confirmed as the right knee. Chart review and update were completed. The patient was taken to the operating room for spinal anesthesia. After successful spinal anesthesia Foley catheter was inserted. The patient was placed supine on the operating table.   the right leg was prepped with DuraPrep and draped sterilely. Timeout was completed. The limb was then  exsanguinated a  6 inch Esmarch. The tourniquet was elevated to 300 mmHg.   A midline incision was made and taken down to the extensor mechanism followed by medial arthrotomy. The patella was everted. A synovectomy was performed as needed. The osteophytes were resected.  Anterior cruciate ligament and PCL and medial and lateral meniscus were resected.   a 3/8 inch drill bit was used to enter the femoral canal which was suctioned and irrigated until the fluid was clear. The distal femoral cut was set for 11 millimeter resection with a 5  Right Valgus angle. This cut was completed and checked for flatness.   the femur was then measured to a size 4.  The cutting block was placed to match the epicondyles and the 4 distal cuts were made.   the tibia was subluxated forward and the external alignment guide was placed. We removed 10 mm of bone from the higher  Lateral  side. We set the guide for neutral varus valgus cut related to the  Mechanical axis of the tibia and for slope matching the patient's anatomy. Rotational alignment was set using the tibial tubercle, tibial spine and second metatarsal. The cutting block was pinned and the proximal tibia was resected.    spacer blocks were placed starting with a 10 mm insert to confirm equal flexion-extension gaps. A size  10  mm insert balanced the gaps.   We placed the femoral notch cutting guide size 4  and resected the notch.   Trial implants were placed using appropriate size femur , appropriate size tibial baseplate which was  measured after the proximal tibia resection. Tibial rotation was set patella tracking was normal   The tibia was then punched per manufacture technique making sure to avoid internal rotation.   The patella measured a size 24   We resected down to a size 15 using a size 51mm x 35 mm button.   Final range of motion check was performed with the appropriate size trials as mentioned above. Satisfactory reduction and motion were  obtained.   Trial implants were removed. The bone was irrigated and dried and the cement was mixed on the back table  exparel was injected in the soft tissues and posterior capsule of the knee  These implants were then cemented in place. Excess cement was removed. The cement was allowed to cure. Second irrigation was performed.    FInal range of motion check and stability check was completed  The wound was irrigated third time Hemovac drain was placed, extensor mechanism was closed with #1 Nurolon followed by 0 Monocryl and staples to reapproximate the skin edges and subcutaneous tissue.   Sterile dressing and cryocuff  was applied  The patient was taken recovery in stable condition    [phone]

## 2017-02-13 NOTE — Evaluation (Signed)
Physical Therapy Evaluation Patient Details Name: Isabella Holmes MRN: [id number] DOB: September 04, 1945 Today's Date: 02/13/2017   History of Present Illness  Isabella Holmes is a 71 y/o female s/p Right TKA 02/13/17  with the diagnosis of primary osteoarthritis right knee    Clinical Impression  Patient limited mostly due to c/o RLE numbness and increased pain with right knee movement, and dizziness during gait training.  Patient instructed in HEP with written instructions provided.  Patient will benefit from continued physical therapy in hospital and recommended venue below to increase strength, balance, endurance for safe ADLs and gait. RIGHT KNEE ROM: 27-65 degrees (-27 degrees extension)    Follow Up Recommendations Home health PT;Supervision/Assistance - 24 hour    Equipment Recommendations  None recommended by PT    Recommendations for Other Services       Precautions / Restrictions Precautions Precautions: Fall Restrictions Weight Bearing Restrictions: Yes RLE Weight Bearing: Weight bearing as tolerated Other Position/Activity Restrictions: no pillows under right knee      Mobility  Bed Mobility Overal bed mobility: Needs Assistance Bed Mobility: Supine to Sit;Sit to Supine     Supine to sit: Min assist Sit to supine: Min assist   General bed mobility comments: need assistance to move RLE  Transfers Overall transfer level: Needs assistance Equipment used: Rolling walker (2 wheeled) Transfers: Sit to/from Stand Sit to Stand: Min assist            Ambulation/Gait Ambulation/Gait assistance: Min assist Ambulation Distance (Feet): 15 Feet Assistive device: Rolling walker (2 wheeled) Gait Pattern/deviations: Decreased step length - right;Decreased stance time - right;Decreased stride length   Gait velocity interpretation: Below normal speed for age/gender General Gait Details: Patient demonstrates slow labored cadence with limited heel strike right foot due to  unable to fully extend knee, limited for gait secondary to c/o dizziness and increasing right knee pain  Stairs            Wheelchair Mobility    Modified Rankin (Stroke Patients Only)       Balance Overall balance assessment: Needs assistance Sitting-balance support: No upper extremity supported;Feet supported Sitting balance-Leahy Scale: Good     Standing balance support: Bilateral upper extremity supported;During functional activity Standing balance-Leahy Scale: Fair Standing balance comment: fair/poor with RW                             Pertinent Vitals/Pain Pain Assessment: 0-10 Pain Score: 9  Pain Location: right knee Pain Descriptors / Indicators: Aching;Discomfort;Sharp Pain Intervention(s): Limited activity within patient's tolerance;Monitored during session;Premedicated before session    Home Living Family/patient expects to be discharged to:: Private residence Living Arrangements: Alone Available Help at Discharge: Family Type of Home: House Home Access: Stairs to enter Entrance Stairs-Rails: None Entrance Stairs-Number of Steps: 1 Home Layout: One level Home Equipment: Bedside commode;Walker - 2 wheels;Cane - single point;Wheelchair - manual      Prior Function Level of Independence: Independent         Comments: community ambulator, drives     Higher education careers adviser        Extremity/Trunk Assessment   Upper Extremity Assessment Upper Extremity Assessment: Defer to OT evaluation    Lower Extremity Assessment Lower Extremity Assessment: Generalized weakness;RLE deficits/detail;LLE deficits/detail RLE Deficits / Details: grossly -3/5 LLE Deficits / Details: grossly 5/5       Communication   Communication: No difficulties  Cognition Arousal/Alertness: Awake/alert Behavior During Therapy: WFL for  tasks assessed/performed Overall Cognitive Status: Within Functional Limits for tasks assessed                                         General Comments      Exercises Total Joint Exercises Ankle Circles/Pumps: AROM;Strengthening;Both;10 reps;Supine Quad Sets: AROM;Strengthening;Right;5 reps;Supine Short Arc Quad: Supine;AAROM;Right;5 reps;Strengthening Heel Slides: Supine;AAROM;Strengthening;Right;5 reps Knee Flexion: PROM;Seated;AAROM;Right(seated active assisted right knee flexion) Goniometric ROM: Right kee: 27-65 degrees   Assessment/Plan    PT Assessment Patient needs continued PT services  PT Problem List Decreased strength;Decreased range of motion;Decreased activity tolerance;Decreased balance;Decreased mobility       PT Treatment Interventions Gait training;Functional mobility training;Therapeutic activities;Therapeutic exercise;Stair training;Patient/family education    PT Goals (Current goals can be found in the Care Plan section)  Acute Rehab PT Goals Patient Stated Goal: return home PT Goal Formulation: With patient/family Time For Goal Achievement: 02/17/17 Potential to Achieve Goals: Good    Frequency 7X/week   Barriers to discharge        Co-evaluation               AM-PAC PT "6 Clicks" Daily Activity  Outcome Measure Difficulty turning over in bed (including adjusting bedclothes, sheets and blankets)?: A Lot Difficulty moving from lying on back to sitting on the side of the bed? : A Lot Difficulty sitting down on and standing up from a chair with arms (e.g., wheelchair, bedside commode, etc,.)?: A Lot Help needed moving to and from a bed to chair (including a wheelchair)?: A Lot Help needed walking in hospital room?: A Lot Help needed climbing 3-5 steps with a railing? : A Lot 6 Click Score: 12    End of Session   Activity Tolerance: Patient limited by fatigue;Patient limited by pain Patient left: in bed;with call bell/phone within reach;with family/visitor present Nurse Communication: Mobility status PT Visit Diagnosis: Unsteadiness on feet (R26.81);Other  abnormalities of gait and mobility (R26.89);Muscle weakness (generalized) (M62.81)    Time: 6962-9528 PT Time Calculation (min) (ACUTE ONLY): 33 min   Charges:   PT Evaluation $PT Eval Moderate Complexity: 1 Mod PT Treatments $Therapeutic Activity: 23-37 mins   PT G Codes:   PT G-Codes **NOT FOR INPATIENT CLASS** Functional Assessment Tool Used: AM-PAC 6 Clicks Basic Mobility Functional Limitation: Mobility: Walking and moving around Mobility: Walking and Moving Around Current Status (U1324): At least 60 percent but less than 80 percent impaired, limited or restricted Mobility: Walking and Moving Around Goal Status [phone]): At least 60 percent but less than 80 percent impaired, limited or restricted Mobility: Walking and Moving Around Discharge Status [phone]): At least 60 percent but less than 80 percent impaired, limited or restricted    4:10 PM, 02/13/17 Ocie Bob, MPT Physical Therapist with [facility] 336 [phone] office [phone] mobile phone

## 2017-02-13 NOTE — Plan of Care (Signed)
  Acute Rehab PT Goals(only PT should resolve) Pt will Roll Supine to Side 02/13/2017 1612 - Progressing by Lonell Grandchild, PT Flowsheets Taken 02/13/2017 1612  Pt will Roll Supine to Side Independently Pt Will Go Supine/Side To Sit 02/13/2017 1612 - Progressing by Lonell Grandchild, PT Flowsheets Taken 02/13/2017 1612  Pt will go Supine/Side to Sit with supervision Patient Will Transfer Sit To/From Stand 02/13/2017 1612 - Progressing by Lonell Grandchild, PT Flowsheets Taken 02/13/2017 1612  Patient will transfer sit to/from stand with supervision Pt Will Transfer Bed To Chair/Chair To Bed 02/13/2017 1612 - Progressing by Lonell Grandchild, PT Flowsheets Taken 02/13/2017 1612  Pt will Transfer Bed to Chair/Chair to Bed with supervision Pt Will Ambulate 02/13/2017 1612 - Progressing by Lonell Grandchild, PT Flowsheets Taken 02/13/2017 1612  Pt will Ambulate 100 feet;with supervision;with rolling walker  4:13 PM, 02/13/17 Lonell Grandchild, MPT Physical Therapist with [facility] 336 [phone] office [phone] mobile phone

## 2017-02-13 NOTE — Anesthesia Preprocedure Evaluation (Signed)
Anesthesia Evaluation  Patient identified by MRN, date of birth, ID band Patient awake    Airway Mallampati: I  TM Distance: >3 FB     Dental  (+) Teeth Intact   Pulmonary shortness of breath (stable), asthma (stable) , sleep apnea , former smoker,    Pulmonary exam normal        Cardiovascular METS: 5 - 7 Mets hypertension, Pt. on medications Normal cardiovascular exam Rhythm:Regular Rate:Normal  Normal sinus rhythm Normal ECG   (April 2018)  No CP/SOB,  active   Neuro/Psych    GI/Hepatic GERD  Medicated and Controlled,  Endo/Other  Hypothyroidism   Renal/GU      Musculoskeletal  (+) Arthritis ,   Abdominal Normal abdominal exam  (+)   Peds  Hematology   Anesthesia Other Findings   Reproductive/Obstetrics                             Anesthesia Physical Anesthesia Plan  ASA: II  Anesthesia Plan: Spinal   Post-op Pain Management:    Induction:   PONV Risk Score and Plan:   Airway Management Planned: Nasal Cannula  Additional Equipment:   Intra-op Plan:   Post-operative Plan:   Informed Consent: I have reviewed the patients History and Physical, chart, labs and discussed the procedure including the risks, benefits and alternatives for the proposed anesthesia with the patient or authorized representative who has indicated his/her understanding and acceptance.   Dental advisory given  Plan Discussed with: CRNA and Surgeon  Anesthesia Plan Comments:         Anesthesia Quick Evaluation

## 2017-02-13 NOTE — Transfer of Care (Signed)
Immediate Anesthesia Transfer of Care Note  Patient: Isabella Holmes  Procedure(s) Performed: TOTAL KNEE ARTHROPLASTY (Right Knee)  Patient Location: PACU  Anesthesia Type:MAC  Level of Consciousness: awake and alert   Airway & Oxygen Therapy: Patient Spontanous Breathing  Post-op Assessment: Report given to RN and Post -op Vital signs reviewed and stable  Post vital signs: Reviewed and stable  Last Vitals:  Vitals:   02/13/17 0837  BP: 132/71  Pulse: (!) 56  Temp: 36.5 C  SpO2: 98%    Last Pain:  Vitals:   02/13/17 0837  TempSrc: Oral  PainSc: 4          Complications: No apparent anesthesia complications

## 2017-02-13 NOTE — Brief Op Note (Addendum)
02/13/2017  1:17 PM  PATIENT:  Isabella Holmes  71 y.o. female  PRE-OPERATIVE DIAGNOSIS:  primary osteoarthritis right knee  POST-OPERATIVE DIAGNOSIS:  primary osteoarthritis right knee  PROCEDURE:  Procedure(s): TOTAL KNEE ARTHROPLASTY (Right)   Depuy  4 narrow femur, 3 tibia, 10 mm PS polyethylene insert 9 x 35 patellar button  Severe arthritis medial compartment grade 4 grade 3 patellofemoral joint normal lateral compartment ACL PCL were intact  SURGEON:  Surgeon(s) and Role:    Carole Civil, MD - Primary  PHYSICIAN ASSISTANT:   ASSISTANTS: Marquita Palms   ANESTHESIA:   spinal  EBL:  50 mL   BLOOD ADMINISTERED:none  DRAINS: hemovac   LOCAL MEDICATIONS USED:  MARCAINE    and OTHER exparel 20/diluted with 40 cc saline   SPECIMEN:  No Specimen  DISPOSITION OF SPECIMEN:  N/A  COUNTS:  YES  TOURNIQUET:   Total Tourniquet Time Documented: Thigh (Right) - 71 minutes Total: Thigh (Right) - 71 minutes   DICTATION: .Viviann Spare Dictation  PLAN OF CARE: Admit to inpatient   PATIENT DISPOSITION:  PACU - hemodynamically stable.   Delay start of Pharmacological VTE agent (>24hrs) due to surgical blood loss or risk of bleeding: yes  Today's date  Date of surgery  Operative report for a right total knee arthroplasty  Preop diagnosis primary osteoarthritis right knee Postop diagnosis same Procedure right total knee arthroplasty Implants Depuy fixed-bearing posterior stabilized Sigma prosthesis  Surgeon Aline Brochure [phone]    The patient was identified in the preop holding area and the surgical site was confirmed as the right knee. Chart review and update were completed. The patient was taken to the operating room for spinal anesthesia. After successful spinal anesthesia Foley catheter was inserted. The patient was placed supine on the operating table.   the right leg was prepped with DuraPrep and draped sterilely. Timeout was completed. The limb was then  exsanguinated a  6 inch Esmarch. The tourniquet was elevated to 300 mmHg.   A midline incision was made and taken down to the extensor mechanism followed by medial arthrotomy. The patella was everted. A synovectomy was performed as needed. The osteophytes were resected.  Anterior cruciate ligament and PCL and medial and lateral meniscus were resected.   a 3/8 inch drill bit was used to enter the femoral canal which was suctioned and irrigated until the fluid was clear. The distal femoral cut was set for 11 millimeter resection with a 5  Right Valgus angle. This cut was completed and checked for flatness.   the femur was then measured to a size 4.  The cutting block was placed to match the epicondyles and the 4 distal cuts were made.   the tibia was subluxated forward and the external alignment guide was placed. We removed 10 mm of bone from the higher  Lateral  side. We set the guide for neutral varus valgus cut related to the  Mechanical axis of the tibia and for slope matching the patient's anatomy. Rotational alignment was set using the tibial tubercle, tibial spine and second metatarsal. The cutting block was pinned and the proximal tibia was resected.    spacer blocks were placed starting with a 10 mm insert to confirm equal flexion-extension gaps. A size  10  mm insert balanced the gaps.   We placed the femoral notch cutting guide size 4  and resected the notch.   Trial implants were placed using appropriate size femur , appropriate size tibial baseplate which was  measured after the proximal tibia resection. Tibial rotation was set patella tracking was normal   The tibia was then punched per manufacture technique making sure to avoid internal rotation.   The patella measured a size 24   We resected down to a size 15 using a size 5mm x 35 mm button.   Final range of motion check was performed with the appropriate size trials as mentioned above. Satisfactory reduction and motion were  obtained.   Trial implants were removed. The bone was irrigated and dried and the cement was mixed on the back table  exparel was injected in the soft tissues and posterior capsule of the knee  These implants were then cemented in place. Excess cement was removed. The cement was allowed to cure. Second irrigation was performed.    FInal range of motion check and stability check was completed  The wound was irrigated third time Hemovac drain was placed, extensor mechanism was closed with #1 Nurolon followed by 0 Monocryl and staples to reapproximate the skin edges and subcutaneous tissue.   Sterile dressing and cryocuff  was applied  The patient was taken recovery in stable condition

## 2017-02-13 NOTE — Anesthesia Procedure Notes (Signed)
Spinal  Patient location during procedure: OR Start time: 02/13/2017 11:23 AM End time: 02/13/2017 11:24 AM Staffing Resident/CRNA: Vista Deck, CRNA Preanesthetic Checklist Completed: patient identified, site marked, surgical consent, pre-op evaluation, timeout performed, IV checked, risks and benefits discussed and monitors and equipment checked Spinal Block Patient position: right lateral decubitus Prep: ChloraPrep and times 2 Patient monitoring: heart rate, cardiac monitor, continuous pulse ox and blood pressure Approach: right paramedian Location: L3-4 Injection technique: single-shot Needle Needle type: Spinocan  Needle gauge: 22 G Needle length: 9 cm Assessment Sensory level: T6 Additional Notes ATTEMPTS: 1 TRAY ID: [id number] LOT# [id number] TRAY EXPIRATION DATE: 2019-01-11

## 2017-02-13 NOTE — Anesthesia Procedure Notes (Signed)
Procedure Name: MAC Date/Time: 02/13/2017 11:15 AM Performed by: Vista Deck, CRNA Pre-anesthesia Checklist: Patient identified, Emergency Drugs available, Suction available, Timeout performed and Patient being monitored Patient Re-evaluated:Patient Re-evaluated prior to induction Oxygen Delivery Method: Non-rebreather mask

## 2017-02-13 NOTE — Anesthesia Postprocedure Evaluation (Signed)
Anesthesia Post Note  Patient: Isabella Holmes  Procedure(s) Performed: TOTAL KNEE ARTHROPLASTY (Right Knee)  Patient location during evaluation: PACU Anesthesia Type: Spinal Level of consciousness: awake and alert Pain management: satisfactory to patient Vital Signs Assessment: post-procedure vital signs reviewed and stable Respiratory status: spontaneous breathing Cardiovascular status: stable Postop Assessment: no apparent nausea or vomiting and spinal receding Anesthetic complications: no     Last Vitals:  Vitals:   02/13/17 0837 02/13/17 1320  BP: 132/71 103/71  Pulse: (!) 56 61  Resp:  13  Temp: 36.5 C 37.1 C  SpO2: 98%     Last Pain:  Vitals:   02/13/17 0837  TempSrc: Oral  PainSc: 4                  Nicky Kras

## 2017-02-14 ENCOUNTER — Encounter (HOSPITAL_COMMUNITY): Payer: Self-pay | Admitting: Orthopedic Surgery

## 2017-02-14 LAB — CBC
HEMATOCRIT: 35.9 % — AB (ref 36.0–46.0)
Hemoglobin: 11.6 g/dL — ABNORMAL LOW (ref 12.0–15.0)
MCH: 29.9 pg (ref 26.0–34.0)
MCHC: 32.3 g/dL (ref 30.0–36.0)
MCV: 92.5 fL (ref 78.0–100.0)
Platelets: 169 10*3/uL (ref 150–400)
RBC: 3.88 MIL/uL (ref 3.87–5.11)
RDW: 13.3 % (ref 11.5–15.5)
WBC: 5.5 10*3/uL (ref 4.0–10.5)

## 2017-02-14 LAB — BASIC METABOLIC PANEL
Anion gap: 5 (ref 5–15)
BUN: 11 mg/dL (ref 6–20)
CALCIUM: 8.8 mg/dL — AB (ref 8.9–10.3)
CO2: 27 mmol/L (ref 22–32)
CREATININE: 0.66 mg/dL (ref 0.44–1.00)
Chloride: 104 mmol/L (ref 101–111)
GFR calc Af Amer: >60 mL/min (ref >60)
GFR calc non Af Amer: >60 mL/min (ref >60)
GLUCOSE: 106 mg/dL — AB (ref 65–99)
Potassium: 3.6 mmol/L (ref 3.5–5.1)
Sodium: 136 mmol/L (ref 135–145)

## 2017-02-14 NOTE — Progress Notes (Signed)
Physical Therapy Treatment Patient Details Name: Isabella Holmes MRN: [id number] DOB: November 25, 1945 Today's Date: 02/14/2017    History of Present Illness Isabella Holmes is a 71 y/o female s/p Right TKA 02/13/17  with the diagnosis of primary osteoarthritis right knee      PT Comments    Patient demonstrates increased endurance for gait training with fair/good return for right heel to toe stepping, no loss of balance and limited secondary to c/o fatigue.  RIGHT KNEE PROM: 17-71 degrees.  Patient will benefit from continued physical therapy in hospital and recommended venue below to increase strength, balance, endurance for safe ADLs and gait.   Follow Up Recommendations  Home health PT;Supervision/Assistance - 24 hour     Equipment Recommendations  None recommended by PT    Recommendations for Other Services       Precautions / Restrictions Precautions Precautions: Fall Restrictions Weight Bearing Restrictions: Yes RLE Weight Bearing: Weight bearing as tolerated Other Position/Activity Restrictions: no pillows under right knee    Mobility  Bed Mobility Overal bed mobility: Needs Assistance Bed Mobility: Supine to Sit;Sit to Supine     Supine to sit: Min assist Sit to supine: Min assist   General bed mobility comments: need assistance to move RLE  Transfers Overall transfer level: Needs assistance Equipment used: Rolling walker (2 wheeled) Transfers: Sit to/from [employer] Sit to Stand: Min assist Stand pivot transfers: Min guard          Ambulation/Gait Ambulation/Gait assistance: Min guard Ambulation Distance (Feet): 40 Feet Assistive device: Rolling walker (2 wheeled) Gait Pattern/deviations: Decreased step length - right;Decreased stance time - right;Decreased stride length   Gait velocity interpretation: Below normal speed for age/gender General Gait Details: demonstrates slow labored cadence with right heel strike 75% of time, no loss of  balance, limited secondary to c/o fatigue   Stairs            Wheelchair Mobility    Modified Rankin (Stroke Patients Only)       Balance Overall balance assessment: Needs assistance Sitting-balance support: No upper extremity supported;Feet supported Sitting balance-Leahy Scale: Good     Standing balance support: Bilateral upper extremity supported;During functional activity Standing balance-Leahy Scale: Fair                              Cognition Arousal/Alertness: Awake/alert Behavior During Therapy: WFL for tasks assessed/performed Overall Cognitive Status: Within Functional Limits for tasks assessed                                        Exercises Total Joint Exercises Ankle Circles/Pumps: AROM;Strengthening;Both;10 reps;Supine Quad Sets: AROM;Strengthening;Right;Supine;10 reps Short Arc Quad: Supine;AAROM;Right;Strengthening;10 reps Heel Slides: Supine;AAROM;Strengthening;Right;10 reps Knee Flexion: PROM;Seated;AAROM;Right(self right knee flexion using LLE with  30 second holds x 3) Goniometric ROM: RIGHT KNEE: 17-71 degrees    General Comments        Pertinent Vitals/Pain Pain Assessment: 0-10 Pain Score: 8  Pain Location: right knee Pain Descriptors / Indicators: Aching;Discomfort;Sharp Pain Intervention(s): Limited activity within patient's tolerance;Monitored during session;Premedicated before session;Repositioned    Home Living Family/patient expects to be discharged to:: Private residence Living Arrangements: Spouse/significant other Available Help at Discharge: Family;Available 24 hours/day Type of Home: House Home Access: Stairs to enter Entrance Stairs-Rails: None Home Layout: One level Home Equipment: Bedside commode;Walker - 2 wheels;Cane - single  point;Wheelchair - manual      Prior Function Level of Independence: Independent      Comments: community ambulator, drives, independent with B/IADLs   PT  Goals (current goals can now be found in the care plan section) Acute Rehab PT Goals Patient Stated Goal: return home PT Goal Formulation: With patient/family Time For Goal Achievement: 02/17/17 Potential to Achieve Goals: Good Progress towards PT goals: Progressing toward goals    Frequency    7X/week      PT Plan Current plan remains appropriate    Co-evaluation              AM-PAC PT "6 Clicks" Daily Activity  Outcome Measure  Difficulty turning over in bed (including adjusting bedclothes, sheets and blankets)?: A Little Difficulty moving from lying on back to sitting on the side of the bed? : A Little Difficulty sitting down on and standing up from a chair with arms (e.g., wheelchair, bedside commode, etc,.)?: A Little Help needed moving to and from a bed to chair (including a wheelchair)?: A Little Help needed walking in hospital room?: A Little Help needed climbing 3-5 steps with a railing? : A Lot 6 Click Score: 17    End of Session   Activity Tolerance: Patient tolerated treatment well;Patient limited by fatigue Patient left: in bed;with call bell/phone within reach;with family/visitor present;in CPM Nurse Communication: Mobility status PT Visit Diagnosis: Unsteadiness on feet (R26.81);Other abnormalities of gait and mobility (R26.89);Muscle weakness (generalized) (M62.81)     Time: 6045-4098 PT Time Calculation (min) (ACUTE ONLY): 35 min  Charges:  $Gait Training: 8-22 mins $Therapeutic Exercise: 8-22 mins                    G Codes:  Functional Assessment Tool Used: AM-PAC 6 Clicks Basic Mobility Functional Limitation: Mobility: Walking and moving around Mobility: Walking and Moving Around Current Status (J1914): At least 40 percent but less than 60 percent impaired, limited or restricted Mobility: Walking and Moving Around Goal Status [phone]): At least 40 percent but less than 60 percent impaired, limited or restricted Mobility: Walking and Moving  Around Discharge Status [phone]): At least 40 percent but less than 60 percent impaired, limited or restricted     9:05 AM, 02/14/17 Ocie Bob, MPT Physical Therapist with [facility] 336 [phone] office [phone] mobile phone

## 2017-02-14 NOTE — Care Management Note (Addendum)
Case Management Note  Patient Details  Name: Isabella Holmes MRN: [id number] Date of Birth: 17-Aug-1945  Subjective/Objective:           S/p knee replacement. Pt is from home, lives with husband. She has cane and RW pta. She has insurance with drug, DME and HH coverage. She will need 3 in 1 and CPM. Pt has chosen [facility] from list of DME agencies for 3 in 1. [name], [facility] rep, aware of referral and will pull pt info from chart and deliver to pt room prior to DC. Referral for CPM was faxed to [facility] per MD preference. Confirmation received from [name] that CPM can be delivered for DC tomorrow. Pt's HH was previously referred by MD office, Pt agreeable to using [facility]. [facility] rep [name] planned for tomorrow and has visited with pt today.  CM will cont to follow and update vendors on DC tomorrow once confirmed.    Expected Discharge Date:     02/15/2017             Expected Discharge Plan:  [place]  In-House Referral:  NA  Discharge planning Services  CM Consult  Post Acute Care Choice:  Durable Medical Equipment, Home Health Choice offered to:  Patient  DME Arranged:  Continuous passive motion machine, 3-N-1 DME Agency:  [employer]  HH Arranged:  PT HH Agency:  [facility] (formerly [employer])  Status of Service:  In process, will continue to follow    Addendum: CM called back by [name], they will not be able to provide CPM as they are not in network. Referral faxed to [facility] who have verified they are in network with pt's payor and will be able to deliver CPM to pt's home tomorrow after DC.    [name], RN 02/14/2017, 2:37 PM

## 2017-02-14 NOTE — Addendum Note (Signed)
Addendum  created 02/14/17 0815 by Mickel Baas, CRNA   Sign clinical note

## 2017-02-14 NOTE — Evaluation (Signed)
Occupational Therapy Evaluation Patient Details Name: Isabella Holmes MRN: [id number] DOB: 1945/07/06 Today's Date: 02/14/2017    History of Present Illness Isabella Holmes is a 71 y/o female s/p Right TKA 02/13/17  with the diagnosis of primary osteoarthritis right knee     Clinical Impression   Pt received in bed, husband present for evaluation; functional mobility limited due to pt feeling light headed. Pt requiring increased assistance for LB tasks due to pain, husband will be present to assist with LB dressing and bathing on discharge. Pt functioning at min guard/min assist level with bed mobility and transfers. No further OT needs at this time.     Follow Up Recommendations  No OT follow up;Supervision/Assistance - 24 hour    Equipment Recommendations  None recommended by OT       Precautions / Restrictions Precautions Precautions: Fall Restrictions Weight Bearing Restrictions: Yes RLE Weight Bearing: Weight bearing as tolerated      Mobility Bed Mobility Overal bed mobility: Needs Assistance Bed Mobility: Supine to Sit     Supine to sit: Min guard        Transfers Overall transfer level: Needs assistance Equipment used: Rolling walker (2 wheeled) Transfers: Sit to/from [employer] Sit to Stand: Min assist Stand pivot transfers: Min guard                ADL either performed or assessed with clinical judgement   ADL Overall ADL's : Needs assistance/impaired                                       General ADL Comments: Pt requiring increased assistance for LB ADL completion and standing tasks due to RLE pain and weakness     Vision Baseline Vision/History: Wears glasses Wears Glasses: At all times Patient Visual Report: No change from baseline Vision Assessment?: No apparent visual deficits            Pertinent Vitals/Pain Pain Assessment: 0-10 Pain Score: 3  Pain Location: right knee Pain Descriptors /  Indicators: Aching;Discomfort;Sharp Pain Intervention(s): Limited activity within patient's tolerance;Monitored during session;Premedicated before session;Repositioned     Hand Dominance Right   Extremity/Trunk Assessment Upper Extremity Assessment Upper Extremity Assessment: Overall WFL for tasks assessed   Lower Extremity Assessment Lower Extremity Assessment: Defer to PT evaluation   Cervical / Trunk Assessment Cervical / Trunk Assessment: Normal   Communication Communication Communication: No difficulties   Cognition Arousal/Alertness: Awake/alert Behavior During Therapy: WFL for tasks assessed/performed Overall Cognitive Status: Within Functional Limits for tasks assessed                                                Home Living Family/patient expects to be discharged to:: Private residence Living Arrangements: Spouse/significant other Available Help at Discharge: Family;Available 24 hours/day Type of Home: House Home Access: Stairs to enter [employer] of Steps: 1 Entrance Stairs-Rails: None Home Layout: One level     Bathroom Shower/Tub: Chief Strategy Officer: Standard     Home Equipment: Bedside commode;Walker - 2 wheels;Cane - single point;Wheelchair - manual          Prior Functioning/Environment Level of Independence: Independent        Comments: community ambulator, drives, independent with B/IADLs  OT Problem List: Decreased activity tolerance;Impaired balance (sitting and/or standing);Pain       End of Session Equipment Utilized During Treatment: Gait belt;Rolling walker  Activity Tolerance: Patient tolerated treatment well Patient left: in chair;with call bell/phone within reach;with family/visitor present  OT Visit Diagnosis: Muscle weakness (generalized) (M62.81);Pain Pain - Right/Left: Right Pain - part of body: Knee                Time: 0728-0751 OT Time Calculation (min): 23  min Charges:  OT General Charges $OT Visit: 1 Visit OT Evaluation $OT Eval Low Complexity: 1 Low   Ezra Sites, OTR/L  [phone] 02/14/2017, 7:55 AM

## 2017-02-14 NOTE — Progress Notes (Signed)
Patient ID: Isabella Holmes, female   DOB: 25-Mar-1945, 71 y.o.   MRN: [id number]   Postoperative note  Postoperative day number  one  Status post right total knee arthroplasty  Vital signs  BP 134/62 (BP Location: Right Arm)   Pulse 67   Temp 98.5 F (36.9 C)   Resp 18   Ht 5\' 5"  (1.651 m)   Wt 179 lb (81.2 kg)   SpO2 91%   BMI 29.79 kg/m    Pertinent labs   CBC Latest Ref Rng & Units 02/14/2017 02/08/2017 06/22/2016  WBC 4.0 - 10.5 K/uL 5.5 5.6 3.8(L)  Hemoglobin 12.0 - 15.0 g/dL 11.6(L) 13.6 13.9  Hematocrit 36.0 - 46.0 % 35.9(L) 42.6 41.1  Platelets 150 - 400 K/uL 169 223 196   BMP Latest Ref Rng & Units 02/14/2017 02/08/2017 06/22/2016  Glucose 65 - 99 mg/dL 106(H) 88 93  BUN 6 - 20 mg/dL 11 15 12   Creatinine 0.44 - 1.00 mg/dL 0.66 0.83 0.80  Sodium 135 - 145 mmol/L 136 139 139  Potassium 3.5 - 5.1 mmol/L 3.6 4.1 4.0  Chloride 101 - 111 mmol/L 104 104 104  CO2 22 - 32 mmol/L 27 30 29   Calcium 8.9 - 10.3 mg/dL 8.8(L) 9.6 9.8     Patient complaints mild dizziness  Physical exam normal neurovascular exam  Assessment and plan   Stable start therapy and CPM machine

## 2017-02-14 NOTE — Anesthesia Postprocedure Evaluation (Signed)
Anesthesia Post Note  Patient: SHAWNITA KRIZEK  Procedure(s) Performed: TOTAL KNEE ARTHROPLASTY (Right Knee)  Patient location during evaluation: Nursing Unit Anesthesia Type: Spinal Level of consciousness: awake and alert and oriented Pain management: pain level controlled Vital Signs Assessment: post-procedure vital signs reviewed and stable Respiratory status: spontaneous breathing and respiratory function stable Cardiovascular status: stable : Some nausea yesterday relieved with Zofran. Anesthetic complications: no     Last Vitals:  Vitals:   02/14/17 0000 02/14/17 0400  BP: (!) 121/59 (!) 114/49  Pulse: 74 75  Resp: 18 17  Temp: 37.1 C 37.2 C  SpO2: 95% 94%    Last Pain:  Vitals:   02/14/17 0400  TempSrc: Oral  PainSc:                  ADAMS, AMY A

## 2017-02-15 ENCOUNTER — Telehealth (INDEPENDENT_AMBULATORY_CARE_PROVIDER_SITE_OTHER): Payer: Self-pay | Admitting: Internal Medicine

## 2017-02-15 ENCOUNTER — Other Ambulatory Visit (INDEPENDENT_AMBULATORY_CARE_PROVIDER_SITE_OTHER): Payer: Self-pay | Admitting: Internal Medicine

## 2017-02-15 DIAGNOSIS — R748 Abnormal levels of other serum enzymes: Secondary | ICD-10-CM

## 2017-02-15 LAB — GLUCOSE, CAPILLARY: Glucose-Capillary: 111 mg/dL — ABNORMAL HIGH (ref 65–99)

## 2017-02-15 LAB — CBC
HEMATOCRIT: 34 % — AB (ref 36.0–46.0)
HEMOGLOBIN: 11.1 g/dL — AB (ref 12.0–15.0)
MCH: 30.1 pg (ref 26.0–34.0)
MCHC: 32.6 g/dL (ref 30.0–36.0)
MCV: 92.1 fL (ref 78.0–100.0)
Platelets: 160 10*3/uL (ref 150–400)
RBC: 3.69 MIL/uL — ABNORMAL LOW (ref 3.87–5.11)
RDW: 13.4 % (ref 11.5–15.5)
WBC: 5.6 10*3/uL (ref 4.0–10.5)

## 2017-02-15 MED ORDER — HYDROCODONE-ACETAMINOPHEN 7.5-325 MG PO TABS: 1.0000 | ORAL_TABLET | ORAL | 0 refills | Status: DC | PRN

## 2017-02-15 MED ORDER — DOCUSATE SODIUM 100 MG PO CAPS: 100.0000 mg | ORAL_CAPSULE | Freq: Two times a day (BID) | ORAL | 0 refills | Status: DC

## 2017-02-15 MED ORDER — METHOCARBAMOL 500 MG PO TABS: 500.0000 mg | ORAL_TABLET | Freq: Four times a day (QID) | ORAL | 1 refills | Status: DC | PRN

## 2017-02-15 MED ORDER — ASPIRIN 325 MG PO TBEC: 325.0000 mg | DELAYED_RELEASE_TABLET | Freq: Every day | ORAL | 0 refills | Status: DC

## 2017-02-15 NOTE — Progress Notes (Signed)
U

## 2017-02-15 NOTE — Care Management Note (Signed)
Case Management Note  Patient Details  Name: KEIRRA ZEIMET MRN: [id number] Date of Birth: 11-15-1945  Expected Discharge Date:    02/15/2017              Expected Discharge Plan:  [place]  In-House Referral:  NA  Discharge planning Services  CM Consult  Post Acute Care Choice:  Durable Medical Equipment, Home Health Choice offered to:  Patient  DME Arranged:  Continuous passive motion machine, 3-N-1 DME Agency:  [employer]  HH Arranged:  PT HH Agency:  [facility] ([facility]  Status of Service:  Completed, signed off  If discussed at [employer] of Stay Meetings, dates discussed:    Additional Comments: Plan remains for DC today. [facility] aware and has spoken with rep and they will deliver once pt gets home. 3 in 1 has been delivered to pt room. [facility] rep, is aware of plan for DC today.   [name], RN 02/15/2017, 1:26 PM

## 2017-02-15 NOTE — Progress Notes (Signed)
Pt's IV catheter removed and intact. Pt's IV site clean dry and intact. Discharge instructions including medications and follow up appointments were reviewed and discussed with patient. All questions were answered and no further questions at this time. Pt in stable condition and in no acute distress at time of discharge. Pt will be escorted by nurse tech.  

## 2017-02-15 NOTE — Care Management Important Message (Signed)
Important Message  Patient Details  Name: Isabella Holmes MRN: [id number] Date of Birth: [birth year]   Medicare Important Message Given:  Yes    [name], RN 02/15/2017, 1:24 PM

## 2017-02-15 NOTE — Telephone Encounter (Signed)
err

## 2017-02-15 NOTE — Pre-Procedure Instructions (Signed)
Progress Note POD 2 Check knee extension, if >10 spend the night in knee immobilizer

## 2017-02-15 NOTE — Progress Notes (Signed)
Physical Therapy Treatment Patient Details Name: Isabella Holmes MRN: [id number] DOB: 18-Nov-1945 Today's Date: 02/15/2017    History of Present Illness Isabella Holmes is a 71 y/o female s/p Right TKA 02/13/17  with the diagnosis of primary osteoarthritis right knee      PT Comments    Patient demonstrates good return for going up/down stairs with occasional verbal cues for proper step sequence and advised to have assistance if having to go up/down stairs after discharge (her family can will assist).  Patient will benefit from continued physical therapy in hospital and recommended venue below to increase strength, balance, endurance for safe ADLs and gait. RIGHT KNEE PROM: 12-74 DEGREES.    Follow Up Recommendations  Home health PT;Supervision/Assistance - 24 hour     Equipment Recommendations  None recommended by PT    Recommendations for Other Services       Precautions / Restrictions Precautions Precautions: Fall Restrictions Weight Bearing Restrictions: Yes RLE Weight Bearing: Weight bearing as tolerated Other Position/Activity Restrictions: no pillows under right knee    Mobility  Bed Mobility Overal bed mobility: Modified Independent Bed Mobility: Supine to Sit     Supine to sit: Modified independent (Device/Increase time)     General bed mobility comments: demonstrates improvement for moving RLE during bed mobility, but requires increased time  Transfers Overall transfer level: Needs assistance Equipment used: Rolling walker (2 wheeled) Transfers: Sit to/from [employer] Sit to Stand: Supervision Stand pivot transfers: Supervision          Ambulation/Gait Ambulation/Gait assistance: Supervision Ambulation Distance (Feet): 100 Feet Assistive device: Rolling walker (2 wheeled) Gait Pattern/deviations: Decreased step length - right;Decreased stance time - right;Decreased stride length   Gait velocity interpretation: Below normal speed for  age/gender General Gait Details: demonsrates slightly labored slow cadence with fair/good return for right heel to toe stepping without loss of balance   Stairs Stairs: Yes   Stair Management: One rail Right;One rail Left;With cane Number of Stairs: 5 General stair comments: demonstrates labored movement with occasional verbal cues for proper step sequence going up/down stairs without loss of balance (patient only has 1 step at home - "per patient")  Wheelchair Mobility    Modified Rankin (Stroke Patients Only)       Balance Overall balance assessment: Needs assistance Sitting-balance support: No upper extremity supported;Feet supported Sitting balance-Leahy Scale: Good     Standing balance support: Bilateral upper extremity supported;During functional activity Standing balance-Leahy Scale: Good Standing balance comment: fair/good using RW                            Cognition Arousal/Alertness: Awake/alert Behavior During Therapy: WFL for tasks assessed/performed Overall Cognitive Status: Within Functional Limits for tasks assessed                                        Exercises Total Joint Exercises Ankle Circles/Pumps: AROM;Strengthening;Both;10 reps;Supine Quad Sets: AROM;Strengthening;Right;Supine;10 reps Short Arc Quad: Supine;AAROM;Right;Strengthening;10 reps Heel Slides: Supine;AAROM;Strengthening;Right;10 reps Knee Flexion: PROM;Seated;AAROM;Right(stretching right knee into end range flexion using LLE) Goniometric ROM: RIGHT KNEE: 12-74 DEGREES    General Comments        Pertinent Vitals/Pain Pain Score: 2  Pain Location: right knee at rest, increases to 5-6/10 with end range flexion Pain Descriptors / Indicators: Aching;Discomfort;Sharp Pain Intervention(s): Limited activity within patient's tolerance;Monitored during session  Home Living                      Prior Function            PT Goals (current goals  can now be found in the care plan section) Acute Rehab PT Goals Patient Stated Goal: return home PT Goal Formulation: With patient/family Time For Goal Achievement: 02/17/17 Potential to Achieve Goals: Good Progress towards PT goals: Progressing toward goals    Frequency    7X/week      PT Plan Current plan remains appropriate    Co-evaluation              AM-PAC PT "6 Clicks" Daily Activity  Outcome Measure  Difficulty turning over in bed (including adjusting bedclothes, sheets and blankets)?: None Difficulty moving from lying on back to sitting on the side of the bed? : None Difficulty sitting down on and standing up from a chair with arms (e.g., wheelchair, bedside commode, etc,.)?: A Little Help needed moving to and from a bed to chair (including a wheelchair)?: A Little Help needed walking in hospital room?: A Little Help needed climbing 3-5 steps with a railing? : A Little 6 Click Score: 20    End of Session Equipment Utilized During Treatment: Gait belt Activity Tolerance: Patient tolerated treatment well Patient left: in chair;with call bell/phone within reach;with family/visitor present Nurse Communication: Mobility status PT Visit Diagnosis: Unsteadiness on feet (R26.81);Other abnormalities of gait and mobility (R26.89);Muscle weakness (generalized) (M62.81)     Time: 4742-5956 PT Time Calculation (min) (ACUTE ONLY): 30 min  Charges:  $Gait Training: 8-22 mins $Therapeutic Exercise: 8-22 mins                    G Codes:  Functional Assessment Tool Used: AM-PAC 6 Clicks Basic Mobility Functional Limitation: Mobility: Walking and moving around Mobility: Walking and Moving Around Current Status (L8756): At least 20 percent but less than 40 percent impaired, limited or restricted Mobility: Walking and Moving Around Goal Status [phone]): At least 20 percent but less than 40 percent impaired, limited or restricted Mobility: Walking and Moving Around Discharge  Status [phone]): At least 20 percent but less than 40 percent impaired, limited or restricted    12:17 PM, 02/15/17 Ocie Bob, MPT Physical Therapist with [facility] [phone] office [phone] mobile phone

## 2017-02-15 NOTE — Telephone Encounter (Signed)
Isabella Holmes, US abdomen in about 4 weeks. Elevated liver enzymes. She has just had a total knee and cannot do it now.

## 2017-02-15 NOTE — Discharge Summary (Signed)
Physician Discharge Summary  Patient ID: Isabella Holmes MRN: [id number] DOB/AGE: 1945-06-18 72 y.o.  Admit date: 02/13/2017 Discharge date: 02/15/2017  Admission Diagnoses: Primary osteoarthritis right knee Discharge Diagnoses:  same Same   Active Problems:   Primary osteoarthritis of right knee   Primary osteoarthritis of one knee, right   Discharged Condition: good  Hospital Course:  Hospital admission data patient had uncomplicated right total knee surgery with a posterior stabilized total knee with a [employer] fixed bearing system  With the [employer] fixed-bearing system  The patient tolerated and completed all physical therapy goals and was discharged on postop day 2 CBC Latest Ref Rng & Units 02/15/2017 02/14/2017 02/08/2017  WBC 4.0 - 10.5 K/uL 5.6 5.5 5.6  Hemoglobin 12.0 - 15.0 g/dL 11.1(L) 11.6(L) 13.6  Hematocrit 36.0 - 46.0 % 34.0(L) 35.9(L) 42.6  Platelets 150 - 400 K/uL 160 169 223    \ BMP Latest Ref Rng & Units 02/14/2017 02/08/2017 06/22/2016  Glucose 65 - 99 mg/dL 536(U) 88 93  BUN 6 - 20 mg/dL 11 15 12   Creatinine 0.44 - 1.00 mg/dL 4.40 3.47 4.25  Sodium 135 - 145 mmol/L 136 139 139  Potassium 3.5 - 5.1 mmol/L 3.6 4.1 4.0  Chloride 101 - 111 mmol/L 104 104 104  CO2 22 - 32 mmol/L 27 30 29   Calcium 8.9 - 10.3 mg/dL 9.5(G) 9.6 9.8    Discharge Exam: Blood pressure 135/66, pulse 72, temperature 98.2 F (36.8 C), temperature source Oral, resp. rate 20, height 5\' 5"  (1.651 m), weight 179 lb (81.2 kg), SpO2 97 %. Incision/Wound:clean  Disposition: 01-Home or Self Care  Discharge Instructions    Ambulatory referral to Home Health   Complete by:  As directed    Please evaluate ORCHID SCHMEISER for admission to [facility].  Disciplines requested: Physical Therapy  Services to provide: Strengthening Exercises  Physician to follow patient's care (the person listed here will be responsible for signing ongoing orders): Referring Provider  Requested  Start of Care Date: Tomorrow  I certify that this patient is under my care and that I, or a Nurse Practitioner or Physician's Assistant working with me, had a face-to-face encounter that meets the physician face-to-face requirements with patient on 12/6. The encounter with the patient was in whole, or in part for the following medical condition(s) which is the primary reason for home health care (List medical condition). Right total knee  Special Instructions:  none   Does the patient have Medicare or Medicaid?:  Yes   The encounter with the patient was in whole, or in part, for the following medical condition, which is the primary reason for home health care:  right tka   Reason for Medically Necessary Home Health Services:   Therapy- Investment banker, operational, Patent examiner Skilled Nursing- Change/Decline in Patient Status     My clinical findings support the need for the above services:  Unable to leave home safely without assistance and/or assistive device   I certify that, based on my findings, the following services are medically necessary home health services:  Physical therapy   Further, I certify that my clinical findings support that this patient is homebound due to:  Unsafe ambulation due to balance issues   CPM   Complete by:  As directed    Continuous passive motion machine (CPM):      Use the CPM from 0 to 70 for 6 hours per day.      You may  increase by 10 per day.  You may break it up into 2 or 3 sessions per day.      Use CPM for 2 weeks or until you are told to stop.   Call MD / Call 911   Complete by:  As directed    If you experience chest pain or shortness of breath, CALL 911 and be transported to the hospital emergency room.  If you develope a fever above 101 F, pus (white drainage) or increased drainage or redness at the wound, or calf pain, call your surgeon's office.   Change dressing   Complete by:  As directed    Change dressing on sun, then change the  dressing daily with sterile 4 x 4 inch gauze dressing and apply TED hose.  You may clean the incision with alcohol prior to redressing.   Constipation Prevention   Complete by:  As directed    Drink plenty of fluids.  Prune juice may be helpful.  You may use a stool softener, such as Colace (over the counter) 100 mg twice a day.  Use MiraLax (over the counter) for constipation as needed.   Diet - low sodium heart healthy   Complete by:  As directed    Do not put a pillow under the knee. Place it under the heel.   Complete by:  As directed    For home use only DME Walker   Complete by:  As directed    Patient needs a walker to treat with the following condition:  History of total right knee replacement   For home use only DME Walker   Complete by:  As directed    Patient needs a walker to treat with the following condition:  History of total right knee replacement   Increase activity slowly as tolerated   Complete by:  As directed    TED hose   Complete by:  As directed    Use stockings (TED hose) for 2 weeks on both leg(s).  You may remove them at night for sleeping.     Allergies as of 02/15/2017      Reactions   Levofloxacin Other (See Comments)   Recommended by Dr due to family hx   Penicillins    REACTION: rash Has patient had a PCN reaction causing immediate rash, facial/tongue/throat swelling, SOB or lightheadedness with hypotension: No Has patient had a PCN reaction causing severe rash involving mucus membranes or skin necrosis: No Has patient had a PCN reaction that required hospitalization No Has patient had a PCN reaction occurring within the last 10 years: No If all of the above answers are "NO", then may proceed with Cephalosporin use.   Streptomycin    REACTION: rash   Sulfonamide Derivatives    REACTION: rash      Medication List    STOP taking these medications   ibuprofen 200 MG tablet Commonly known as:  ADVIL,MOTRIN     TAKE these medications   albuterol  108 (90 Base) MCG/ACT inhaler Commonly known as:  PROVENTIL HFA;VENTOLIN HFA Inhale 1-2 puffs into the lungs every 4 (four) hours as needed for wheezing or shortness of breath.   aspirin 325 MG EC tablet Take 1 tablet (325 mg total) by mouth daily with breakfast. Start taking on:  02/16/2017   cetirizine 10 MG tablet Commonly known as:  ZYRTEC Take 10 mg by mouth as needed for allergies.   docusate sodium 100 MG capsule Commonly known as:  COLACE Take  1 capsule (100 mg total) by mouth 2 (two) times daily.   HYDROcodone-acetaminophen 7.5-325 MG tablet Commonly known as:  NORCO Take 1 tablet by mouth every 4 (four) hours as needed for moderate pain.   LAMICTAL 150 MG tablet Generic drug:  lamoTRIgine Take 150 mg by mouth at bedtime.   levothyroxine 50 MCG tablet Commonly known as:  SYNTHROID, LEVOTHROID Take 50 mcg by mouth daily before breakfast.   lubiprostone 24 MCG capsule Commonly known as:  AMITIZA Take 1 capsule (24 mcg total) by mouth 2 (two) times daily with a meal.   methocarbamol 500 MG tablet Commonly known as:  ROBAXIN Take 1 tablet (500 mg total) by mouth every 6 (six) hours as needed for muscle spasms.            Durable Medical Equipment  (From admission, onward)        Start     Ordered   02/15/17 1429  For home use only DME 3 n 1  Once     02/15/17 1432   02/15/17 1429  For Home Use Only DME CPM  Once    Question Answer Comment  Laterality Right Knee   Length of Need 2 weeks   Starting Flexion 70   Ending Flexion 120   Increase by Daily 10   Surgery Date 02/13/2017   CPM Started 02/14/2017      02/15/17 1432   02/15/17 0000  For home use only DME Walker    Question:  Patient needs a walker to treat with the following condition  Answer:  History of total right knee replacement   02/15/17 1432   02/15/17 0000  For home use only DME Walker    Question:  Patient needs a walker to treat with the following condition  Answer:  History of total  right knee replacement   02/15/17 1432   02/14/17 1120  For home use only DME 3 n 1  Once     02/14/17 1119       Discharge Care Instructions  (From admission, onward)        Start     Ordered   02/15/17 0000  Change dressing    Comments:  Change dressing on sun, then change the dressing daily with sterile 4 x 4 inch gauze dressing and apply TED hose.  You may clean the incision with alcohol prior to redressing.   02/15/17 1432       Signed: Fuller Canada 02/15/2017, 2:35 PM

## 2017-02-16 ENCOUNTER — Telehealth: Payer: Self-pay | Admitting: Radiology

## 2017-02-16 DIAGNOSIS — Z96651 Presence of right artificial knee joint: Secondary | ICD-10-CM

## 2017-02-16 NOTE — Telephone Encounter (Signed)
[place] sch'd 04/03/17 at 830 (815), npo after midnight, patient aware

## 2017-02-19 LAB — BPAM RBC
BLOOD PRODUCT EXPIRATION DATE: 201812192359
BLOOD PRODUCT EXPIRATION DATE: 201812192359
Blood Product Expiration Date: 201812192359
ISSUE DATE / TIME: 201811301820
UNIT TYPE AND RH: 6200
Unit Type and Rh: 600
Unit Type and Rh: 600

## 2017-02-19 LAB — TYPE AND SCREEN
ABO/RH(D): A POS
ANTIBODY SCREEN: NEGATIVE
UNIT DIVISION: 0
UNIT DIVISION: 0
Unit division: 0

## 2017-02-20 ENCOUNTER — Encounter (HOSPITAL_COMMUNITY): Payer: Self-pay | Admitting: *Deleted

## 2017-02-20 ENCOUNTER — Other Ambulatory Visit: Payer: Self-pay

## 2017-02-20 ENCOUNTER — Emergency Department (HOSPITAL_COMMUNITY): Payer: Medicare Other

## 2017-02-20 ENCOUNTER — Emergency Department (HOSPITAL_COMMUNITY)
Admission: EM | Admit: 2017-02-20 | Discharge: 2017-02-21 | Disposition: A | Discharge status: Home / Self Care | Payer: Medicare Other | Attending: Emergency Medicine | Admitting: Emergency Medicine

## 2017-02-20 ENCOUNTER — Telehealth: Payer: Self-pay | Admitting: Radiology

## 2017-02-20 DIAGNOSIS — R072 Precordial pain: Secondary | ICD-10-CM | POA: Diagnosis not present

## 2017-02-20 DIAGNOSIS — E785 Hyperlipidemia, unspecified: Secondary | ICD-10-CM | POA: Diagnosis not present

## 2017-02-20 DIAGNOSIS — Z7982 Long term (current) use of aspirin: Secondary | ICD-10-CM | POA: Diagnosis not present

## 2017-02-20 DIAGNOSIS — R079 Chest pain, unspecified: Secondary | ICD-10-CM | POA: Diagnosis present

## 2017-02-20 DIAGNOSIS — J45909 Unspecified asthma, uncomplicated: Secondary | ICD-10-CM | POA: Insufficient documentation

## 2017-02-20 DIAGNOSIS — I252 Old myocardial infarction: Secondary | ICD-10-CM | POA: Diagnosis not present

## 2017-02-20 DIAGNOSIS — Z79899 Other long term (current) drug therapy: Secondary | ICD-10-CM | POA: Insufficient documentation

## 2017-02-20 DIAGNOSIS — E039 Hypothyroidism, unspecified: Secondary | ICD-10-CM | POA: Diagnosis not present

## 2017-02-20 DIAGNOSIS — Z87891 Personal history of nicotine dependence: Secondary | ICD-10-CM | POA: Insufficient documentation

## 2017-02-20 LAB — BASIC METABOLIC PANEL
ANION GAP: 8 (ref 5–15)
BUN: 12 mg/dL (ref 6–20)
CALCIUM: 9.4 mg/dL (ref 8.9–10.3)
CO2: 28 mmol/L (ref 22–32)
Chloride: 98 mmol/L — ABNORMAL LOW (ref 101–111)
Creatinine, Ser: 0.76 mg/dL (ref 0.44–1.00)
GFR calc Af Amer: >60 mL/min (ref >60)
GLUCOSE: 100 mg/dL — AB (ref 65–99)
POTASSIUM: 3.7 mmol/L (ref 3.5–5.1)
SODIUM: 134 mmol/L — AB (ref 135–145)

## 2017-02-20 LAB — CBC
HCT: 38.9 % (ref 36.0–46.0)
Hemoglobin: 12.6 g/dL (ref 12.0–15.0)
MCH: 30 pg (ref 26.0–34.0)
MCHC: 32.4 g/dL (ref 30.0–36.0)
MCV: 92.6 fL (ref 78.0–100.0)
PLATELETS: 267 10*3/uL (ref 150–400)
RBC: 4.2 MIL/uL (ref 3.87–5.11)
RDW: 13.4 % (ref 11.5–15.5)
WBC: 4.9 10*3/uL (ref 4.0–10.5)

## 2017-02-20 LAB — I-STAT TROPONIN, ED: TROPONIN I, POC: 0 ng/mL (ref 0.00–0.08)

## 2017-02-20 NOTE — Telephone Encounter (Signed)
Patient left a message on the voicemail complaining of chest pain left sided, burning pain. She also feels light headed. I have advised her to seek care in the emergency room. To you FYI, I do not feel as though she is going to take my advise and go to the Emergency room.

## 2017-02-20 NOTE — ED Provider Notes (Signed)
[facility] EMERGENCY DEPARTMENT Provider Note   CSN: [id number] Arrival date & time: 02/20/17  2035     History   Chief Complaint Chief Complaint  Patient presents with  . Chest Pain    HPI Isabella Holmes is a 71 y.o. female.  The history is provided by the patient.  Chest Pain   This is a new problem. The current episode started 6 to 12 hours ago. The problem occurs constantly. The problem has not changed since onset.The pain is associated with movement. Pain location: Left chest. The pain is moderate. The quality of the pain is described as burning. The pain radiates to the upper back. The symptoms are aggravated by certain positions. Pertinent negatives include no abdominal pain, no cough, no fever, no hemoptysis, no nausea, no shortness of breath, no syncope and no vomiting. She has tried nothing for the symptoms. Risk factors include being elderly.  Pertinent negatives for past medical history include no seizures.  She reports onset of left-sided chest burning earlier this morning It only seems worse with change in position or standing up Will sometimes move to her back She denies fever, cough, shortness of breath, diaphoresis She denies history of stroke, MI, PE  She reports recent total knee replacement, but she has not had any complications since that time She denies any dyspnea on exertion, or fatigue  Past Medical History:  Diagnosis Date  . Arthritis   . Asthma   . Chest pain    w abnormal ECg  . Chronic headaches    migraine  . Dyslipidemia   . Dyspnea   . Glucose intolerance (impaired glucose tolerance)   . Hemorrhoids 03/25/2015  . Hypothyroidism   . Seizure disorder ([place])    on Lamictal; has not had seizure in 15 years  . Seizures ([place])   . Sleep apnea    lost weight and does not need CPAP any more.  . Varicose veins 03/25/2015  . Venous insufficiency     Patient Active Problem List   Diagnosis Date Noted  . Primary osteoarthritis of one knee,  right 02/13/2017  . Primary osteoarthritis of right knee   . Encounter for screening colonoscopy 01/31/2017  . Derangement of posterior horn of medial meniscus of right knee   . Varicose veins 03/25/2015  . Hemorrhoids 03/25/2015  . Vulvar abscess 02/12/2015  . OVERWEIGHT 01/06/2009  . ANXIETY STATE, UNSPECIFIED 01/06/2009  . HYPERTENSION 01/06/2009  . ESOPHAGEAL REFLUX 01/06/2009  . SEIZURE DISORDER 01/06/2009  . HYPERSOMNIA WITH SLEEP APNEA UNSPECIFIED 01/06/2009  . UNSPECIFIED SLEEP APNEA 01/06/2009  . OTHER CHEST PAIN 01/06/2009    Past Surgical History:  Procedure Laterality Date  . KNEE ARTHROSCOPY Left   . KNEE ARTHROSCOPY WITH MEDIAL MENISECTOMY Right 06/28/2016   Procedure: KNEE ARTHROSCOPY WITH MEDIAL MENISECTOMY;  Surgeon: Carole Civil, MD;  Location: [facility];  Service: Orthopedics;  Laterality: Right;  . left vein stripping    . status post bilateral tubaligation    . TONSILLECTOMY     many years ago  . TOTAL KNEE ARTHROPLASTY Right 02/13/2017   Procedure: TOTAL KNEE ARTHROPLASTY;  Surgeon: Carole Civil, MD;  Location: [facility];  Service: Orthopedics;  Laterality: Right;  . TUBAL LIGATION      OB History    Gravida Para Term Preterm AB Living   2 2       2    SAB TAB Ectopic Multiple Live Births  Home Medications    Prior to Admission medications   Medication Sig Start Date End Date Taking? Authorizing Provider  albuterol (PROVENTIL HFA;VENTOLIN HFA) 108 (90 Base) MCG/ACT inhaler Inhale 1-2 puffs into the lungs every 4 (four) hours as needed for wheezing or shortness of breath.    [provider]  aspirin EC 325 MG EC tablet Take 1 tablet (325 mg total) by mouth daily with breakfast. 02/16/17   Carole Civil, MD  cetirizine (ZYRTEC) 10 MG tablet Take 10 mg by mouth as needed for allergies.    [provider]  docusate sodium (COLACE) 100 MG capsule Take 1 capsule (100 mg total) by mouth 2 (two) times daily.  02/15/17   Carole Civil, MD  HYDROcodone-acetaminophen (NORCO) 7.5-325 MG tablet Take 1 tablet by mouth every 4 (four) hours as needed for moderate pain. 02/15/17   Carole Civil, MD  lamoTRIgine (LAMICTAL) 150 MG tablet Take 150 mg by mouth at bedtime.     [provider]  levothyroxine (SYNTHROID, LEVOTHROID) 50 MCG tablet Take 50 mcg by mouth daily before breakfast.  05/19/16   [provider]  lubiprostone (AMITIZA) 24 MCG capsule Take 1 capsule (24 mcg total) by mouth 2 (two) times daily with a meal. Patient not taking: Reported on 02/06/2017 01/31/17   Butch Penny, NP  methocarbamol (ROBAXIN) 500 MG tablet Take 1 tablet (500 mg total) by mouth every 6 (six) hours as needed for muscle spasms. 02/15/17   Carole Civil, MD    Family History Family History  Problem Relation Age of Onset  . Heart attack Mother   . Tuberculosis Father   . Heart disease Father        cardiac disease  . Cirrhosis Father   . Factor V Leiden deficiency Other   . Seizures Son     Social History Social History   Tobacco Use  . Smoking status: Former Smoker    Packs/day: 2.50    Years: 20.00    Pack years: 50.00    Types: Cigarettes    Last attempt to quit: 06/23/1986    Years since quitting: 30.6  . Smokeless tobacco: Never Used  Substance Use Topics  . Alcohol use: No  . Drug use: No     Allergies   Levofloxacin; Penicillins; Streptomycin; and Sulfonamide derivatives   Review of Systems Review of Systems  Constitutional: Negative for chills, fatigue and fever.  Respiratory: Negative for cough, hemoptysis and shortness of breath.   Cardiovascular: Positive for chest pain. Negative for syncope.  Gastrointestinal: Negative for abdominal pain, nausea and vomiting.  Skin: Negative for rash.  Neurological: Negative for seizures and syncope.  All other systems reviewed and are negative.    Physical Exam Updated Vital Signs BP (!) 156/87 (BP Location:  Right Arm)   Pulse 61   Temp 98.8 F (37.1 C)   Resp 14   Ht 1.651 m (5\' 5" )   Wt 81.2 kg (179 lb)   SpO2 98%   BMI 29.79 kg/m   Physical Exam CONSTITUTIONAL: Well developed/well nourished, elderly HEAD: Normocephalic/atraumatic EYES: EOMI/PERRL ENMT: Mucous membranes moist NECK: supple no meningeal signs SPINE/BACK:entire spine nontender CV: S1/S2 noted, no murmurs/rubs/gallops noted LUNGS: Lungs are clear to auscultation bilaterally, no apparent distress ABDOMEN: soft, nontender, no rebound or guarding, bowel sounds noted throughout abdomen GU:no cva tenderness NEURO: Pt is awake/alert/appropriate, moves all extremitiesx4.  No facial droop.   EXTREMITIES: pulses normal/equal, full ROM, bandage noted to right knee,  no lower extremity calf tenderness or edema SKIN: warm, color normal, no rash noted to left chest or left upper back, no rash to left breast, nursing present for exam PSYCH: no abnormalities of mood noted, alert and oriented to situation   ED Treatments / Results  Labs (all labs ordered are listed, but only abnormal results are displayed) Labs Reviewed  BASIC METABOLIC PANEL - Abnormal; Notable for the following components:      Result Value   Sodium 134 (*)    Chloride 98 (*)    Glucose, Bld 100 (*)    All other components within normal limits  CBC  I-STAT TROPONIN, ED    EKG  EKG Interpretation  Date/Time:  Tuesday February 20 2017 21:06:49 EST Ventricular Rate:  66 PR Interval:  148 QRS Duration: 82 QT Interval:  382 QTC Calculation: 400 R Axis:   -6 Text Interpretation:  Normal sinus rhythm Nonspecific T wave abnormality Abnormal ECG No STEMI.  Confirmed by Nanda Quinton [phone]) on 02/20/2017 9:12:48 PM       Radiology Dg Chest 2 View  Result Date: 02/20/2017 CLINICAL DATA:  Left chest pain since this morning. EXAM: CHEST  2 VIEW COMPARISON:  PA and lateral chest 02/08/2017. FINDINGS: Lungs are clear. Heart size is normal. No pneumothorax or  pleural fluid. Aortic atherosclerosis noted. No acute bony abnormality. IMPRESSION: No acute disease. Atherosclerosis. Electronically Signed   By: Inge Rise M.D.   On: 02/20/2017 21:25    Procedures Procedures (including critical care time)  Medications Ordered in ED Medications - No data to display   Initial Impression / Assessment and Plan / ED Course  I have reviewed the triage vital signs and the nursing notes.  Pertinent labs & imaging results that were available during my care of the patient were reviewed by me and considered in my medical decision making (see chart for details).     11:21 PM She is awake and alert, no distress she is very well-appearing Reports pain is only worsened with movement or changing positions At this time she is well-appearing, I doubt ACS, doubt PE Low suspicion for dissection 11:50 PM Reports she feels improved I discussed options of monitoring the ED, and getting a repeat troponin in 3 hours Patient prefers to go home as she is feeling improved Discussed strict ER return precautions Final Clinical Impressions(s) / ED Diagnoses   Final diagnoses:  Precordial pain    ED Discharge Orders    None       Ripley Fraise, MD 02/20/17 2351

## 2017-02-20 NOTE — Telephone Encounter (Signed)
Called to give Verbal order YIAX [phone] for this week/ next week.

## 2017-02-20 NOTE — Discharge Instructions (Signed)

## 2017-02-20 NOTE — Telephone Encounter (Signed)
Patients daughter called Friday, home health has not contacted them and they need a Rx for walker faxed. I have faxed the walker order, and called HHPT with [facility]. [facility] will contact patient, I was told they have the order, but they were not aware patient was d/c

## 2017-02-20 NOTE — ED Triage Notes (Signed)
Pt reports chest pain that she states is in her left breast. Pt states she is having a burning pain. Pt recently had a knee replacement on Dec.4th and is followed by Dr. Aline Brochure. Dr. Aline Brochure was not in the office today and was told to come to the ED>

## 2017-02-21 ENCOUNTER — Telehealth: Payer: Self-pay | Admitting: Orthopedic Surgery

## 2017-02-21 NOTE — Telephone Encounter (Signed)
Clare Gandy with [facility] called stating that he spoke with patient last night about 6:30pm. She told him that she was having chest pain and in her upper left arm. He advised her to go to the hospital and patient stated her family was coming to take her.   If you need to contact him: [phone]

## 2017-02-21 NOTE — Telephone Encounter (Signed)
She did go to ER

## 2017-02-22 ENCOUNTER — Other Ambulatory Visit: Payer: Self-pay | Admitting: Orthopedic Surgery

## 2017-02-22 ENCOUNTER — Other Ambulatory Visit: Payer: Self-pay | Admitting: Radiology

## 2017-02-22 DIAGNOSIS — Z96659 Presence of unspecified artificial knee joint: Secondary | ICD-10-CM

## 2017-02-22 MED ORDER — HYDROCODONE-ACETAMINOPHEN 7.5-325 MG PO TABS: 1.0000 | ORAL_TABLET | ORAL | 0 refills | Status: DC | PRN

## 2017-02-22 NOTE — Telephone Encounter (Signed)
Patient needs refill of Norco 7.5 mg, can you try to e send this for her? She uses [facility], I am not sure if this will work. She has 2 pills left.

## 2017-02-22 NOTE — Telephone Encounter (Signed)
Thank you, I have advised same, she did go. Dr Aline Brochure is aware.

## 2017-02-27 DIAGNOSIS — Z96651 Presence of right artificial knee joint: Secondary | ICD-10-CM | POA: Insufficient documentation

## 2017-02-28 ENCOUNTER — Encounter: Payer: Self-pay | Admitting: Orthopedic Surgery

## 2017-02-28 ENCOUNTER — Ambulatory Visit (INDEPENDENT_AMBULATORY_CARE_PROVIDER_SITE_OTHER): Payer: Self-pay | Admitting: Orthopedic Surgery

## 2017-02-28 DIAGNOSIS — Z96651 Presence of right artificial knee joint: Secondary | ICD-10-CM

## 2017-02-28 NOTE — Progress Notes (Signed)
POST OP VISIT   Patient ID: Isabella Holmes, female   DOB: 01-08-1946, 71 y.o.   MRN: [id number]  Chief Complaint  Patient presents with  . Routine Post Op    02/13/17 date of surgery     Encounter Diagnosis  Name Primary?  . S/P total knee replacement, right 02/13/17    Postop day #15 status post right total knee  Range of motion by therapy is 5-93 in the office is about 5-80  Recommend continue physical therapy as an outpatient  and continue aspirin and Robaxin as well as hydrocodone but the TED hose can stop   4-week follow-up

## 2017-03-19 ENCOUNTER — Other Ambulatory Visit: Payer: Self-pay | Admitting: Adult Health

## 2017-03-19 DIAGNOSIS — Z1231 Encounter for screening mammogram for malignant neoplasm of breast: Secondary | ICD-10-CM

## 2017-03-21 ENCOUNTER — Other Ambulatory Visit: Payer: Self-pay | Admitting: Orthopedic Surgery

## 2017-03-21 ENCOUNTER — Telehealth: Payer: Self-pay | Admitting: Orthopedic Surgery

## 2017-03-21 MED ORDER — IBU 600 MG PO TABS: 600.0000 mg | ORAL_TABLET | Freq: Four times a day (QID) | ORAL | 5 refills | Status: DC | PRN

## 2017-03-21 NOTE — Telephone Encounter (Signed)
done

## 2017-03-21 NOTE — Telephone Encounter (Signed)
Dalinda called stating that she is still having some leg pain at night and requests a prescription for Ibuprofen 800 mgs.   She uses [facility] in [place]

## 2017-03-28 ENCOUNTER — Encounter: Payer: Self-pay | Admitting: Orthopedic Surgery

## 2017-03-28 ENCOUNTER — Telehealth: Payer: Self-pay | Admitting: Radiology

## 2017-03-28 ENCOUNTER — Ambulatory Visit (INDEPENDENT_AMBULATORY_CARE_PROVIDER_SITE_OTHER): Payer: Medicare Other | Admitting: Orthopedic Surgery

## 2017-03-28 VITALS — BP 151/79 | HR 67 | Ht 65.0 in | Wt 179.0 lb

## 2017-03-28 DIAGNOSIS — M24661 Ankylosis, right knee: Secondary | ICD-10-CM

## 2017-03-28 DIAGNOSIS — Z96651 Presence of right artificial knee joint: Secondary | ICD-10-CM

## 2017-03-28 NOTE — Progress Notes (Signed)
POST OP VISIT   Patient ID: Isabella Holmes, female   DOB: May 16, 1945, 72 y.o.   MRN: [id number]  Chief Complaint  Patient presents with  . Post-op Follow-up    knee replacement right 02/13/17    Encounter Diagnosis  Name Primary?  . S/P revision of total knee, right 02/13/17 Yes   Patient's current range of motion after 6 weeks is 10-104 according to therapy note dated January 15 however in the office I get more like 10-90.  Recommend manipulation under anesthesia patient understands risk of fracture  We will schedule the surgery for manipulation under anesthesia right knee status post total knee replacement with arthrofibrosis of her January 24

## 2017-03-28 NOTE — Addendum Note (Signed)
Addended by: [name] E on: 03/28/2017 12:12 PM   Modules accepted: Orders, SmartSet

## 2017-03-28 NOTE — Telephone Encounter (Signed)
I called for precert of the surgery CPT [phone] spoke to [facility]. There is not a precert required for this procedure   Ref number is her name, today's date 03/28/17 and time 12:33

## 2017-03-29 NOTE — Patient Instructions (Signed)
    Isabella Holmes  03/29/2017     @PREFPERIOPPHARMACY @   Your procedure is scheduled on 04/05/2017.  Report to [facility] at 10:00 A.M.  Call this number if you have problems the morning of surgery:  [phone]   Remember:  Do not eat food or drink liquids after midnight.  Take these medicines the morning of surgery with A SIP OF WATER Albuterol inhaler and bring with you, Zyrtec, Norco if needed, Lamictal, Synthroid  Robaxin if needed   Do not wear jewelry, make-up or nail polish.  Do not wear lotions, powders, or perfumes, or deodorant.  Do not shave 48 hours prior to surgery.  Men may shave face and neck.  Do not bring valuables to the hospital.  [facility] is not responsible for any belongings or valuables.  Contacts, dentures or bridgework may not be worn into surgery.  Leave your suitcase in the car.  After surgery it may be brought to your room.  For patients admitted to the hospital, discharge time will be determined by your treatment team.  Patients discharged the day of surgery will not be allowed to drive home.   Please read over the following fact sheets that you were given. Anesthesia Post-op Instructions     PATIENT INSTRUCTIONS POST-ANESTHESIA  IMMEDIATELY FOLLOWING SURGERY:  Do not drive or operate machinery for the first twenty four hours after surgery.  Do not make any important decisions for twenty four hours after surgery or while taking narcotic pain medications or sedatives.  If you develop intractable nausea and vomiting or a severe headache please notify your doctor immediately.  FOLLOW-UP:  Please make an appointment with your surgeon as instructed. You do not need to follow up with anesthesia unless specifically instructed to do so.  WOUND CARE INSTRUCTIONS (if applicable):  Keep a dry clean dressing on the anesthesia/puncture wound site if there is drainage.  Once the wound has quit draining you may leave it open to air.  Generally you should  leave the bandage intact for twenty four hours unless there is drainage.  If the epidural site drains for more than 36-48 hours please call the anesthesia department.  QUESTIONS?:  Please feel free to call your physician or the hospital operator if you have any questions, and they will be happy to assist you.

## 2017-03-30 ENCOUNTER — Encounter (HOSPITAL_COMMUNITY)
Admission: RE | Admit: 2017-03-30 | Discharge: 2017-03-30 | Disposition: A | Discharge status: Home / Self Care | Payer: Medicare Other | Source: Ambulatory Visit | Attending: Orthopedic Surgery | Admitting: Orthopedic Surgery

## 2017-03-30 ENCOUNTER — Ambulatory Visit (HOSPITAL_COMMUNITY)
Admission: RE | Admit: 2017-03-30 | Discharge: 2017-03-30 | Disposition: A | Discharge status: Home / Self Care | Payer: Medicare Other | Source: Ambulatory Visit | Attending: Adult Health | Admitting: Adult Health

## 2017-03-30 ENCOUNTER — Encounter (HOSPITAL_COMMUNITY): Payer: Self-pay

## 2017-03-30 ENCOUNTER — Other Ambulatory Visit: Payer: Self-pay

## 2017-03-30 DIAGNOSIS — Z1231 Encounter for screening mammogram for malignant neoplasm of breast: Secondary | ICD-10-CM | POA: Insufficient documentation

## 2017-03-30 DIAGNOSIS — Z01812 Encounter for preprocedural laboratory examination: Secondary | ICD-10-CM | POA: Diagnosis present

## 2017-03-30 DIAGNOSIS — G4730 Sleep apnea, unspecified: Secondary | ICD-10-CM | POA: Insufficient documentation

## 2017-03-30 DIAGNOSIS — R569 Unspecified convulsions: Secondary | ICD-10-CM | POA: Diagnosis not present

## 2017-03-30 DIAGNOSIS — M1711 Unilateral primary osteoarthritis, right knee: Secondary | ICD-10-CM | POA: Insufficient documentation

## 2017-03-30 DIAGNOSIS — I10 Essential (primary) hypertension: Secondary | ICD-10-CM | POA: Insufficient documentation

## 2017-03-30 DIAGNOSIS — G4710 Hypersomnia, unspecified: Secondary | ICD-10-CM | POA: Insufficient documentation

## 2017-03-30 DIAGNOSIS — F411 Generalized anxiety disorder: Secondary | ICD-10-CM | POA: Insufficient documentation

## 2017-03-30 DIAGNOSIS — K219 Gastro-esophageal reflux disease without esophagitis: Secondary | ICD-10-CM | POA: Insufficient documentation

## 2017-03-30 LAB — CBC
HCT: 40.1 % (ref 36.0–46.0)
HEMOGLOBIN: 12.7 g/dL (ref 12.0–15.0)
MCH: 29.1 pg (ref 26.0–34.0)
MCHC: 31.7 g/dL (ref 30.0–36.0)
MCV: 91.8 fL (ref 78.0–100.0)
Platelets: 216 10*3/uL (ref 150–400)
RBC: 4.37 MIL/uL (ref 3.87–5.11)
RDW: 13.1 % (ref 11.5–15.5)
WBC: 4.9 10*3/uL (ref 4.0–10.5)

## 2017-03-30 LAB — BASIC METABOLIC PANEL
Anion gap: 9 (ref 5–15)
BUN: 23 mg/dL — AB (ref 6–20)
CHLORIDE: 102 mmol/L (ref 101–111)
CO2: 27 mmol/L (ref 22–32)
Calcium: 9.8 mg/dL (ref 8.9–10.3)
Creatinine, Ser: 0.74 mg/dL (ref 0.44–1.00)
GFR calc non Af Amer: >60 mL/min (ref >60)
Glucose, Bld: 131 mg/dL — ABNORMAL HIGH (ref 65–99)
POTASSIUM: 4.2 mmol/L (ref 3.5–5.1)
SODIUM: 138 mmol/L (ref 135–145)

## 2017-04-03 ENCOUNTER — Other Ambulatory Visit (HOSPITAL_COMMUNITY): Payer: Medicare Other

## 2017-04-04 NOTE — H&P (Signed)
Isabella Holmes is an 72 y.o. female.   Chief Complaint: Stiffness of the right knee HPI: 72 years old status post right total knee approximately 6 weeks ago went through her rehab course has been having decreased flexion and extension than what is expected.  Pain is well controlled.  I have recommended she undergo manipulation to improve her flexion and extension.  Past Medical History:  Diagnosis Date  . Arthritis   . Asthma   . Chest pain    w abnormal ECg  . Chronic headaches    migraine  . Dyslipidemia   . Dyspnea   . Glucose intolerance (impaired glucose tolerance)   . Hemorrhoids 03/25/2015  . Hypothyroidism   . Seizure disorder ([place])    on Lamictal; has not had seizure in 15 years  . Seizures ([place])   . Sleep apnea    lost weight and does not need CPAP any more.  . Varicose veins 03/25/2015  . Venous insufficiency     Past Surgical History:  Procedure Laterality Date  . KNEE ARTHROSCOPY Left   . KNEE ARTHROSCOPY WITH MEDIAL MENISECTOMY Right 06/28/2016   Procedure: KNEE ARTHROSCOPY WITH MEDIAL MENISECTOMY;  Surgeon: Carole Civil, MD;  Location: [facility];  Service: Orthopedics;  Laterality: Right;  . left vein stripping    . status post bilateral tubaligation    . TONSILLECTOMY     many years ago  . TOTAL KNEE ARTHROPLASTY Right 02/13/2017   Procedure: TOTAL KNEE ARTHROPLASTY;  Surgeon: Carole Civil, MD;  Location: [facility];  Service: Orthopedics;  Laterality: Right;  . TUBAL LIGATION      Family History  Problem Relation Age of Onset  . Heart attack Mother   . Tuberculosis Father   . Heart disease Father        cardiac disease  . Cirrhosis Father   . Factor V Leiden deficiency Other   . Seizures Son    Social History:  reports that she quit smoking about 30 years ago. Her smoking use included cigarettes. She has a 50.00 pack-year smoking history. she has never used smokeless tobacco. She reports that she does not drink alcohol or use  drugs.  Allergies:  Allergies  Allergen Reactions  . Levofloxacin Other (See Comments)    Recommended by Dr due to family hx  . Penicillins     REACTION: rash Has patient had a PCN reaction causing immediate rash, facial/tongue/throat swelling, SOB or lightheadedness with hypotension: No Has patient had a PCN reaction causing severe rash involving mucus membranes or skin necrosis: No Has patient had a PCN reaction that required hospitalization No Has patient had a PCN reaction occurring within the last 10 years: No If all of the above answers are "NO", then may proceed with Cephalosporin use.   . Streptomycin     REACTION: rash  . Sulfonamide Derivatives     REACTION: rash    No medications prior to admission.    No results found for this or any previous visit (from the past 48 hour(s)). No results found.  Review of Systems  Constitutional: Negative for fever.  Respiratory: Negative for shortness of breath.   Cardiovascular: Negative for chest pain.  All other systems reviewed and are negative.   There were no vitals taken for this visit. Physical Exam  Constitutional: She is oriented to person, place, and time. She appears well-nourished.  Eyes: Right eye exhibits no discharge. Left eye exhibits no discharge. No scleral icterus.  Neck: Neck  supple. No JVD present. No tracheal deviation present.  Cardiovascular: Intact distal pulses.  Respiratory: Effort normal. No stridor.  GI: Soft. She exhibits no distension.  Neurological: She is alert and oriented to person, place, and time. She has normal reflexes. She exhibits normal muscle tone. Coordination normal.  Skin: Skin is warm and dry. No rash noted. No erythema. No pallor.  Psychiatric: She has a normal mood and affect. Her behavior is normal. Thought content normal.     Patient's current range of motion after 6 weeks is 10-104 according to therapy note dated January 15 however in the office I get more like 10-90.    Recommend manipulation under anesthesia right knee, patient understands risk of fracture   We will schedule the surgery for manipulation under anesthesia right knee status post total knee replacement with arthrofibrosis of her January 24     Assessment/Plan  status post total knee replacement with arthrofibrosis  Manipulation under anesthesia right knee   [name], MD 04/04/2017, 5:07 PM

## 2017-04-05 ENCOUNTER — Encounter (HOSPITAL_COMMUNITY)
Admission: RE | Disposition: A | Discharge status: Home / Self Care | Payer: Self-pay | Source: Ambulatory Visit | Attending: Orthopedic Surgery

## 2017-04-05 ENCOUNTER — Ambulatory Visit (HOSPITAL_COMMUNITY): Payer: Medicare Other | Admitting: Anesthesiology

## 2017-04-05 ENCOUNTER — Other Ambulatory Visit: Payer: Self-pay | Admitting: Orthopedic Surgery

## 2017-04-05 ENCOUNTER — Encounter (HOSPITAL_COMMUNITY): Payer: Self-pay | Admitting: *Deleted

## 2017-04-05 ENCOUNTER — Telehealth: Payer: Self-pay | Admitting: Radiology

## 2017-04-05 ENCOUNTER — Ambulatory Visit (HOSPITAL_COMMUNITY)
Admission: RE | Admit: 2017-04-05 | Discharge: 2017-04-05 | Disposition: A | Discharge status: Home / Self Care | Payer: Medicare Other | Source: Ambulatory Visit | Attending: Orthopedic Surgery | Admitting: Orthopedic Surgery

## 2017-04-05 DIAGNOSIS — Z888 Allergy status to other drugs, medicaments and biological substances status: Secondary | ICD-10-CM | POA: Insufficient documentation

## 2017-04-05 DIAGNOSIS — M24661 Ankylosis, right knee: Secondary | ICD-10-CM | POA: Diagnosis present

## 2017-04-05 DIAGNOSIS — F419 Anxiety disorder, unspecified: Secondary | ICD-10-CM | POA: Diagnosis not present

## 2017-04-05 DIAGNOSIS — E785 Hyperlipidemia, unspecified: Secondary | ICD-10-CM | POA: Insufficient documentation

## 2017-04-05 DIAGNOSIS — T8482XD Fibrosis due to internal orthopedic prosthetic devices, implants and grafts, subsequent encounter: Secondary | ICD-10-CM | POA: Diagnosis not present

## 2017-04-05 DIAGNOSIS — G40909 Epilepsy, unspecified, not intractable, without status epilepticus: Secondary | ICD-10-CM | POA: Diagnosis not present

## 2017-04-05 DIAGNOSIS — Z96651 Presence of right artificial knee joint: Secondary | ICD-10-CM | POA: Insufficient documentation

## 2017-04-05 DIAGNOSIS — J45909 Unspecified asthma, uncomplicated: Secondary | ICD-10-CM | POA: Diagnosis not present

## 2017-04-05 DIAGNOSIS — E039 Hypothyroidism, unspecified: Secondary | ICD-10-CM | POA: Diagnosis not present

## 2017-04-05 DIAGNOSIS — Z882 Allergy status to sulfonamides status: Secondary | ICD-10-CM | POA: Insufficient documentation

## 2017-04-05 DIAGNOSIS — Z881 Allergy status to other antibiotic agents status: Secondary | ICD-10-CM | POA: Diagnosis not present

## 2017-04-05 DIAGNOSIS — Z880 Allergy status to penicillin: Secondary | ICD-10-CM | POA: Insufficient documentation

## 2017-04-05 DIAGNOSIS — Z8249 Family history of ischemic heart disease and other diseases of the circulatory system: Secondary | ICD-10-CM | POA: Insufficient documentation

## 2017-04-05 DIAGNOSIS — T8482XA Fibrosis due to internal orthopedic prosthetic devices, implants and grafts, initial encounter: Secondary | ICD-10-CM

## 2017-04-05 DIAGNOSIS — M1990 Unspecified osteoarthritis, unspecified site: Secondary | ICD-10-CM | POA: Insufficient documentation

## 2017-04-05 DIAGNOSIS — Z87891 Personal history of nicotine dependence: Secondary | ICD-10-CM | POA: Insufficient documentation

## 2017-04-05 DIAGNOSIS — I872 Venous insufficiency (chronic) (peripheral): Secondary | ICD-10-CM | POA: Diagnosis not present

## 2017-04-05 HISTORY — PX: EXAM UNDER ANESTHESIA WITH MANIPULATION OF KNEE: SHX5816

## 2017-04-05 SURGERY — MANIPULATION, JOINT, KNEE, WITH ANESTHESIA
Anesthesia: General | Site: Knee | Laterality: Right

## 2017-04-05 MED ORDER — PREGABALIN 50 MG PO CAPS
ORAL_CAPSULE | ORAL | Status: AC
  Filled 2017-04-05: qty 1

## 2017-04-05 MED ORDER — METHYLPREDNISOLONE SODIUM SUCC 40 MG IJ SOLR
40.0000 mg | Freq: Once | INTRAMUSCULAR | Status: AC
  Administered 2017-04-05: 40 mg via INTRAVENOUS

## 2017-04-05 MED ORDER — IBUPROFEN 800 MG PO TABS
800.0000 mg | ORAL_TABLET | Freq: Once | ORAL | Status: AC
  Administered 2017-04-05: 800 mg via ORAL

## 2017-04-05 MED ORDER — METHOCARBAMOL 1000 MG/10ML IJ SOLN
500.0000 mg | Freq: Once | INTRAVENOUS | Status: AC
  Administered 2017-04-05: 500 mg via INTRAVENOUS
  Filled 2017-04-05: qty 550

## 2017-04-05 MED ORDER — LIDOCAINE HCL (CARDIAC) 20 MG/ML IV SOLN
INTRAVENOUS | Status: DC | PRN
  Administered 2017-04-05: 30 mg via INTRAVENOUS

## 2017-04-05 MED ORDER — ONDANSETRON HCL 4 MG/2ML IJ SOLN
INTRAMUSCULAR | Status: AC
  Filled 2017-04-05: qty 2

## 2017-04-05 MED ORDER — LACTATED RINGERS IV SOLN
INTRAVENOUS | Status: DC
  Administered 2017-04-05 (×2): via INTRAVENOUS

## 2017-04-05 MED ORDER — IBUPROFEN 800 MG PO TABS
ORAL_TABLET | ORAL | Status: AC
Start: 2017-04-05 — End: ?
  Filled 2017-04-05: qty 1

## 2017-04-05 MED ORDER — MIDAZOLAM HCL 2 MG/2ML IJ SOLN
1.0000 mg | INTRAMUSCULAR | Status: AC
  Administered 2017-04-05: 2 mg via INTRAVENOUS
  Filled 2017-04-05: qty 2

## 2017-04-05 MED ORDER — FENTANYL CITRATE (PF) 100 MCG/2ML IJ SOLN
INTRAMUSCULAR | Status: DC | PRN
  Administered 2017-04-05 (×2): 50 ug via INTRAVENOUS
  Administered 2017-04-05 (×4): 25 ug via INTRAVENOUS
  Administered 2017-04-05: 50 ug via INTRAVENOUS

## 2017-04-05 MED ORDER — DEXAMETHASONE SODIUM PHOSPHATE 4 MG/ML IJ SOLN
4.0000 mg | INTRAMUSCULAR | Status: AC
  Administered 2017-04-05: 4 mg via INTRAVENOUS
  Filled 2017-04-05: qty 1

## 2017-04-05 MED ORDER — PREGABALIN 50 MG PO CAPS
50.0000 mg | ORAL_CAPSULE | Freq: Once | ORAL | Status: AC
  Administered 2017-04-05: 50 mg via ORAL

## 2017-04-05 MED ORDER — ACETAMINOPHEN 500 MG PO TABS
ORAL_TABLET | ORAL | Status: AC
  Filled 2017-04-05: qty 2

## 2017-04-05 MED ORDER — OXYCODONE HCL 5 MG PO TABS
5.0000 mg | ORAL_TABLET | Freq: Once | ORAL | Status: AC
  Administered 2017-04-05: 5 mg via ORAL

## 2017-04-05 MED ORDER — METHYLPREDNISOLONE SODIUM SUCC 40 MG IJ SOLR
INTRAMUSCULAR | Status: AC
  Filled 2017-04-05: qty 1

## 2017-04-05 MED ORDER — ACETAMINOPHEN 500 MG PO TABS
1000.0000 mg | ORAL_TABLET | Freq: Once | ORAL | Status: AC
  Administered 2017-04-05: 1000 mg via ORAL

## 2017-04-05 MED ORDER — OXYCODONE HCL 5 MG PO TABS
ORAL_TABLET | ORAL | Status: AC
  Filled 2017-04-05: qty 1

## 2017-04-05 MED ORDER — CHLORHEXIDINE GLUCONATE 4 % EX LIQD: 60.0000 mL | Freq: Once | CUTANEOUS | Status: DC

## 2017-04-05 MED ORDER — METHOCARBAMOL 500 MG PO TABS: 500.0000 mg | ORAL_TABLET | Freq: Four times a day (QID) | ORAL | 2 refills | Status: DC

## 2017-04-05 MED ORDER — HYDROMORPHONE HCL 1 MG/ML IJ SOLN
0.2500 mg | INTRAMUSCULAR | Status: DC | PRN
  Filled 2017-04-05: qty 1

## 2017-04-05 MED ORDER — ONDANSETRON HCL 4 MG/2ML IJ SOLN
4.0000 mg | Freq: Once | INTRAMUSCULAR | Status: AC
  Administered 2017-04-05: 4 mg via INTRAVENOUS
  Filled 2017-04-05: qty 2

## 2017-04-05 MED ORDER — FENTANYL CITRATE (PF) 100 MCG/2ML IJ SOLN
25.0000 ug | Freq: Once | INTRAMUSCULAR | Status: AC
  Administered 2017-04-05: 25 ug via INTRAVENOUS
  Filled 2017-04-05: qty 2

## 2017-04-05 MED ORDER — HYDROCODONE-ACETAMINOPHEN 7.5-325 MG PO TABS: 1.0000 | ORAL_TABLET | ORAL | 0 refills | Status: DC | PRN

## 2017-04-05 MED ORDER — PROPOFOL 10 MG/ML IV BOLUS
INTRAVENOUS | Status: DC | PRN
  Administered 2017-04-05: 50 mg via INTRAVENOUS
  Administered 2017-04-05: 100 mg via INTRAVENOUS

## 2017-04-05 MED ORDER — ONDANSETRON HCL 4 MG/2ML IJ SOLN
4.0000 mg | Freq: Once | INTRAMUSCULAR | Status: AC
  Administered 2017-04-05: 4 mg via INTRAVENOUS

## 2017-04-05 SURGICAL SUPPLY — 2 items
KIT ROOM TURNOVER APOR (KITS) ×3 IMPLANT
PAD ARMBOARD 7.5X6 YLW CONV (MISCELLANEOUS) ×3 IMPLANT

## 2017-04-05 NOTE — Anesthesia Procedure Notes (Signed)
Procedure Name: LMA Insertion Date/Time: 04/05/2017 11:40 AM Performed by: Maclane Holloran A, CRNA Pre-anesthesia Checklist: Patient identified, Timeout performed, Emergency Drugs available, Suction available and Patient being monitored Patient Re-evaluated:Patient Re-evaluated prior to induction Oxygen Delivery Method: Circle system utilized Preoxygenation: Pre-oxygenation with 100% oxygen Induction Type: IV induction LMA: LMA inserted LMA Size: 4.0 Number of attempts: 1 Placement Confirmation: CO2 detector and breath sounds checked- equal and bilateral Tube secured with: Tape Dental Injury: Teeth and Oropharynx as per pre-operative assessment

## 2017-04-05 NOTE — Brief Op Note (Signed)
04/05/2017  11:52 AM  PATIENT:  Isabella Holmes  72 y.o. female  PRE-OPERATIVE DIAGNOSIS:  Arthrofibrosis right knee status post right total knee  POST-OPERATIVE DIAGNOSIS:  Arthrofibrosis right knee status post right total knee  PROCEDURE:  Procedure(s): EXAM UNDER ANESTHESIA WITH MANIPULATION OF RIGHT KNEE (Right)   Exam findings preop range of motion was about 85 degrees postop passive 125 degrees with assisted passive range of motion 135 degrees preop extension 10 degrees postop extension 0 degrees  Details of procedure site marking was performed in the preop area surgical consent was signed in chart was updated  Patient taken to surgery for general anesthesia  Timeout was completed  The exam under anesthesia consisted of passive flexion test and lying leg extension test with range of motion of 85 degrees and 10 degrees flexion and extension respectively  After manipulation we got 0 degrees extension passive flexion test was 125 degrees passive range of motion assisted was 135 degrees photographs are in epic  Patient extubated taken recovery room placed in CPM machine    SURGEON:  Surgeon(s) and Role:    Carole Civil, MD - Primary  PHYSICIAN ASSISTANT:   ASSISTANTS: none   ANESTHESIA:   general  EBL:  none   BLOOD ADMINISTERED:none  DRAINS: none   LOCAL MEDICATIONS USED:  NONE  SPECIMEN:  No Specimen  DISPOSITION OF SPECIMEN:  N/A  COUNTS:  YES  TOURNIQUET:  * No tourniquets in log *  DICTATION: .Dragon Dictation  PLAN OF CARE: Discharge to home after PACU  PATIENT DISPOSITION:  PACU - hemodynamically stable.   Delay start of Pharmacological VTE agent (>24hrs) due to surgical blood loss or risk of bleeding: not applicable

## 2017-04-05 NOTE — Anesthesia Preprocedure Evaluation (Signed)
Anesthesia Evaluation  Patient identified by MRN, date of birth, ID band Patient awake    Reviewed: Allergy & Precautions, NPO status , Patient's Chart, lab work & pertinent test results  Airway Mallampati: I  TM Distance: >3 FB Neck ROM: Full    Dental  (+) Teeth Intact   Pulmonary shortness of breath and with exertion, asthma , sleep apnea , former smoker,    breath sounds clear to auscultation       Cardiovascular (-) hypertension+ DOE   Rhythm:Regular Rate:Normal     Neuro/Psych  Headaches, Seizures -, Well Controlled,  PSYCHIATRIC DISORDERS Anxiety    GI/Hepatic GERD  Controlled and Medicated,  Endo/Other  Hypothyroidism   Renal/GU      Musculoskeletal  (+) Arthritis ,   Abdominal   Peds  Hematology   Anesthesia Other Findings   Reproductive/Obstetrics                             Anesthesia Physical Anesthesia Plan  ASA: II  Anesthesia Plan: General   Post-op Pain Management:    Induction: Intravenous  PONV Risk Score and Plan:   Airway Management Planned: LMA  Additional Equipment:   Intra-op Plan:   Post-operative Plan: Extubation in OR  Informed Consent: I have reviewed the patients History and Physical, chart, labs and discussed the procedure including the risks, benefits and alternatives for the proposed anesthesia with the patient or authorized representative who has indicated his/her understanding and acceptance.   Dental advisory given  Plan Discussed with: CRNA and Surgeon  Anesthesia Plan Comments:         Anesthesia Quick Evaluation

## 2017-04-05 NOTE — Discharge Instructions (Signed)

## 2017-04-05 NOTE — Telephone Encounter (Signed)
-----   Message from Carole Civil, MD sent at 04/05/2017 12:05 PM EST ----- Regarding: [name] Set up cpm for home asap delivery  Set up PT at [facility] 5 x a week for mon or tues

## 2017-04-05 NOTE — Transfer of Care (Signed)
Immediate Anesthesia Transfer of Care Note  Patient: Isabella Holmes  Procedure(s) Performed: [name] UNDER ANESTHESIA WITH MANIPULATION OF RIGHT KNEE (Right Knee)  Patient Location: PACU  Anesthesia Type:General  Level of Consciousness: awake, alert , oriented and patient cooperative  Airway & Oxygen Therapy: Patient Spontanous Breathing and Patient connected to nasal cannula oxygen  Post-op Assessment: Report given to RN and Post -op Vital signs reviewed and stable  Post vital signs: Reviewed and stable  Last Vitals:  Vitals:   04/05/17 1125 04/05/17 1130  BP: 136/78 132/79  Resp: 13 16  Temp:    SpO2: 98% 98%    Last Pain:  Vitals:   04/05/17 1012  TempSrc: Oral         Complications: No apparent anesthesia complications

## 2017-04-05 NOTE — Op Note (Signed)
04/05/2017  11:52 AM  PATIENT:  Ophelia Charter  72 y.o. female  PRE-OPERATIVE DIAGNOSIS:  Arthrofibrosis right knee status post right total knee  POST-OPERATIVE DIAGNOSIS:  Arthrofibrosis right knee status post right total knee  PROCEDURE:  Procedure(s): EXAM UNDER ANESTHESIA WITH MANIPULATION OF RIGHT KNEE (Right)   Exam findings preop range of motion was about 85 degrees postop passive 125 degrees with assisted passive range of motion 135 degrees preop extension 10 degrees postop extension 0 degrees  Details of procedure site marking was performed in the preop area surgical consent was signed in chart was updated  Patient taken to surgery for general anesthesia  Timeout was completed  The exam under anesthesia consisted of passive flexion test and lying leg extension test with range of motion of 85 degrees and 10 degrees flexion and extension respectively  After manipulation we got 0 degrees extension passive flexion test was 125 degrees passive range of motion assisted was 135 degrees photographs are in epic  Patient extubated taken recovery room placed in CPM machine    SURGEON:  Surgeon(s) and Role:    Carole Civil, MD - Primary  PHYSICIAN ASSISTANT:   ASSISTANTS: none   ANESTHESIA:   general  EBL:  none   BLOOD ADMINISTERED:none  DRAINS: none   LOCAL MEDICATIONS USED:  NONE  SPECIMEN:  No Specimen  DISPOSITION OF SPECIMEN:  N/A  COUNTS:  YES  TOURNIQUET:  * No tourniquets in log *  DICTATION: .Dragon Dictation  PLAN OF CARE: Discharge to home after PACU  PATIENT DISPOSITION:  PACU - hemodynamically stable.   Delay start of Pharmacological VTE agent (>24hrs) due to surgical blood loss or risk of bleeding: not applicable

## 2017-04-05 NOTE — Interval H&P Note (Signed)
History and Physical Interval Note:  04/05/2017 11:23 AM  Isabella Holmes  has presented today for surgery, with the diagnosis of Arthrofibrosis right knee status post right total knee  The various methods of treatment have been discussed with the patient and family. After consideration of risks, benefits and other options for treatment, the patient has consented to  Procedure(s): EXAM UNDER ANESTHESIA WITH MANIPULATION OF RIGHT KNEE (Right) as a surgical intervention .  The patient's history has been reviewed, patient examined, no change in status, stable for surgery.  I have reviewed the patient's chart and labs.  Questions were answered to the patient's satisfaction.     [name]

## 2017-04-05 NOTE — Interval H&P Note (Signed)
History and Physical Interval Note:  04/05/2017 11:23 AM  Isabella Holmes  has presented today for surgery, with the diagnosis of Arthrofibrosis right knee status post right total knee  The various methods of treatment have been discussed with the patient and family. After consideration of risks, benefits and other options for treatment, the patient has consented to  Procedure(s): EXAM UNDER ANESTHESIA WITH MANIPULATION OF KNEE (Right) as a surgical intervention .  The patient's history has been reviewed, patient examined, no change in status, stable for surgery.  I have reviewed the patient's chart and labs.  Questions were answered to the patient's satisfaction.     [name]

## 2017-04-05 NOTE — Anesthesia Postprocedure Evaluation (Signed)
Anesthesia Post Note  Patient: Isabella Holmes  Procedure(s) Performed: [name] UNDER ANESTHESIA WITH MANIPULATION OF RIGHT KNEE (Right Knee)  Patient location during evaluation: PACU Anesthesia Type: General Level of consciousness: awake and alert, oriented and patient cooperative Pain management: pain level controlled Vital Signs Assessment: post-procedure vital signs reviewed and stable Respiratory status: spontaneous breathing Cardiovascular status: stable Postop Assessment: no apparent nausea or vomiting Anesthetic complications: no     Last Vitals:  Vitals:   04/05/17 1315 04/05/17 1329  BP: (!) 157/85   Pulse: 66 65  Resp: 13 11  Temp:    SpO2: 99% 96%    Last Pain:  Vitals:   04/05/17 1245  TempSrc:   PainSc: Asleep                 [name] A

## 2017-04-09 ENCOUNTER — Encounter (HOSPITAL_COMMUNITY): Payer: Self-pay | Admitting: Orthopedic Surgery

## 2017-04-11 ENCOUNTER — Telehealth: Payer: Self-pay | Admitting: Orthopedic Surgery

## 2017-04-11 ENCOUNTER — Encounter (HOSPITAL_COMMUNITY): Admission: RE | Payer: Self-pay | Source: Ambulatory Visit

## 2017-04-11 ENCOUNTER — Ambulatory Visit (HOSPITAL_COMMUNITY): Admission: RE | Admit: 2017-04-11 | Payer: Medicare Other | Source: Ambulatory Visit | Admitting: Internal Medicine

## 2017-04-11 SURGERY — COLONOSCOPY
Anesthesia: Moderate Sedation

## 2017-04-11 NOTE — Telephone Encounter (Signed)
Called to address flexion contracture postmanipulation advised prone hangs and gravity assisted flexion patient indicates motion is improving we will see him in follow-up

## 2017-04-11 NOTE — Telephone Encounter (Signed)
-----   Message from Candice Camp, RT sent at 04/10/2017 10:34 AM EST ----- Tammy is concerned about her mom. Her knee still lacks full extension, what is the expectation at this point? She is going for physical therapy today. She has the CPM and is really working hard.

## 2017-04-13 ENCOUNTER — Encounter: Payer: Self-pay | Admitting: Orthopedic Surgery

## 2017-04-13 ENCOUNTER — Ambulatory Visit (HOSPITAL_COMMUNITY): Payer: Medicare Other

## 2017-04-13 ENCOUNTER — Ambulatory Visit (INDEPENDENT_AMBULATORY_CARE_PROVIDER_SITE_OTHER): Payer: Self-pay | Admitting: Orthopedic Surgery

## 2017-04-13 VITALS — BP 132/84 | HR 62 | Ht 65.0 in | Wt 179.0 lb

## 2017-04-13 DIAGNOSIS — Z96651 Presence of right artificial knee joint: Secondary | ICD-10-CM

## 2017-04-13 DIAGNOSIS — T8482XD Fibrosis due to internal orthopedic prosthetic devices, implants and grafts, subsequent encounter: Secondary | ICD-10-CM

## 2017-04-13 NOTE — Progress Notes (Signed)
POST OP VISIT   Patient ID: Isabella Holmes, female   DOB: Sep 10, 1945, 72 y.o.   MRN: [id number]  Chief Complaint  Patient presents with  . Follow-up    Recheck on right knee, TKA, 02/13/17. Follow up from [facility], DOS 04/05/17.    Encounter Diagnoses  Name Primary?  . S/P total knee replacement, right 02/13/17   . Arthrofibrosis of total knee replacement, subsequent encounter right s/p Manipulation 04/05/17 Yes   Today the patient came in with about 25 degree flexion contracture with 95 degrees flexion.  I was able to get her down to 5 degrees and then once she relaxed she was able to maintain that  Recommend she continue her therapy 5 days a week for this week then she can go 3 days a week  She should continue with home exercises have added a knee immobilizer at night follow-up in a week

## 2017-04-13 NOTE — Patient Instructions (Signed)
Knee immobilizer at night   PT 5 days a week x 1 week then 3 x a week

## 2017-04-16 ENCOUNTER — Ambulatory Visit (HOSPITAL_COMMUNITY): Admission: RE | Admit: 2017-04-16 | Payer: Medicare Other | Source: Ambulatory Visit

## 2017-04-20 ENCOUNTER — Ambulatory Visit (INDEPENDENT_AMBULATORY_CARE_PROVIDER_SITE_OTHER): Payer: Medicare Other | Admitting: Orthopedic Surgery

## 2017-04-20 VITALS — BP 139/88 | HR 66 | Ht 65.0 in | Wt 170.0 lb

## 2017-04-20 DIAGNOSIS — Z96651 Presence of right artificial knee joint: Secondary | ICD-10-CM

## 2017-04-20 DIAGNOSIS — T8482XD Fibrosis due to internal orthopedic prosthetic devices, implants and grafts, subsequent encounter: Secondary | ICD-10-CM

## 2017-04-20 NOTE — Progress Notes (Signed)
Status post arthrofibrosis manipulation of total knee on January 24 status post total knee on December 4 comes in for recheck her flexion is about 110 degrees her extension is still -20 degrees  Recommend  JAS splint for 6 weeks and then if no improvement in the extension recommend dropout casting  Encounter Diagnoses  Name Primary?  Marland Kitchen Arthrofibrosis of total knee replacement, subsequent encounter 04/05/17 Yes  . S/P total knee replacement, right 02/13/17

## 2017-04-23 ENCOUNTER — Telehealth: Payer: Self-pay | Admitting: Radiology

## 2017-04-24 NOTE — Telephone Encounter (Signed)
If you get a chance this week will you come by and sign an order for JAS extension brace, if you want her to have this?

## 2017-04-26 ENCOUNTER — Ambulatory Visit (HOSPITAL_COMMUNITY): Admission: RE | Admit: 2017-04-26 | Payer: Medicare Other | Source: Ambulatory Visit

## 2017-05-01 ENCOUNTER — Other Ambulatory Visit: Payer: Self-pay | Admitting: Orthopedic Surgery

## 2017-05-01 ENCOUNTER — Telehealth: Payer: Self-pay | Admitting: Orthopedic Surgery

## 2017-05-01 MED ORDER — HYDROCODONE-ACETAMINOPHEN 5-325 MG PO TABS: 1.0000 | ORAL_TABLET | Freq: Four times a day (QID) | ORAL | 0 refills | Status: DC | PRN

## 2017-05-01 NOTE — Telephone Encounter (Signed)
Phone note received regarding patient having pain for 3 weeks although did not mention during her last office visit  I called her and she said she got up fine yesterday went to therapy came back felt intense pain which she describes as pulling sensation in the back of the knee and some pain on the side/medial aspect of her knee  Denies erythema or swelling no fever.  Recommend ibuprofen I added some Norco (she was already taken ibuprofen intermittently) I told her to take the Robaxin 500 mg 4 times a day instead of just at night and add a heating pad underneath her knee prior to going to bed  She has an appointment March 22 if she has any problems she will call sooner

## 2017-05-01 NOTE — Telephone Encounter (Signed)
Having awful pain in right knee. Has been dealing with it for the last 3 weeks.   Please call her at [phone]

## 2017-05-23 ENCOUNTER — Ambulatory Visit (INDEPENDENT_AMBULATORY_CARE_PROVIDER_SITE_OTHER): Payer: Medicare Other

## 2017-05-23 ENCOUNTER — Ambulatory Visit (INDEPENDENT_AMBULATORY_CARE_PROVIDER_SITE_OTHER): Payer: Medicare Other | Admitting: Orthopedic Surgery

## 2017-05-23 ENCOUNTER — Encounter: Payer: Self-pay | Admitting: Orthopedic Surgery

## 2017-05-23 VITALS — BP 143/82 | HR 61 | Ht 65.0 in | Wt 171.0 lb

## 2017-05-23 DIAGNOSIS — Z96651 Presence of right artificial knee joint: Secondary | ICD-10-CM

## 2017-05-23 DIAGNOSIS — T8482XD Fibrosis due to internal orthopedic prosthetic devices, implants and grafts, subsequent encounter: Secondary | ICD-10-CM | POA: Diagnosis not present

## 2017-05-23 DIAGNOSIS — G8929 Other chronic pain: Secondary | ICD-10-CM

## 2017-05-23 DIAGNOSIS — M25561 Pain in right knee: Secondary | ICD-10-CM

## 2017-05-23 NOTE — Progress Notes (Signed)
POST OP VISIT   Patient ID: Isabella Holmes, female   DOB: 1945/06/15, 72 y.o.   MRN: [id number]  Chief Complaint  Patient presents with  . Post-op Follow-up    manipulation right knee 04/05/17 TKR right 02/13/17  . Knee Pain    right knee more painful     Encounter Diagnoses  Name Primary?  Marland Kitchen Arthrofibrosis of total knee replacement, subsequent encounter s/p Manipulation right knee 04/05/17 Yes  . S/P total knee replacement, right 02/13/17    SHE IS IN PT SHE HAS JAS SPLINT   CURRENT IN OFFICE ROM IS 10-105 measured with goniometer  She is having increased pain especially at night and she is not progressing well on the extension side though she has regained up to 105 degrees of knee flexion   XRAYS: No obvious complications noted on the x-ray PLAN:   CBC  ESR CRP  CONTINUE PT    If the lab results are normal then I have informed the patient that she may need a revision

## 2017-05-24 ENCOUNTER — Telehealth: Payer: Self-pay | Admitting: Radiology

## 2017-05-24 LAB — CBC WITH DIFFERENTIAL/PLATELET
BASOS PCT: 1.1 %
Basophils Absolute: 51 cells/uL (ref 0–200)
EOS PCT: 5.3 %
Eosinophils Absolute: 244 cells/uL (ref 15–500)
HCT: 39.2 % (ref 35.0–45.0)
HEMOGLOBIN: 13.3 g/dL (ref 11.7–15.5)
Lymphs Abs: 1371 cells/uL (ref 850–3900)
MCH: 29.6 pg (ref 27.0–33.0)
MCHC: 33.9 g/dL (ref 32.0–36.0)
MCV: 87.1 fL (ref 80.0–100.0)
MONOS PCT: 8.1 %
MPV: 10.5 fL (ref 7.5–12.5)
NEUTROS ABS: 2562 {cells}/uL (ref 1500–7800)
Neutrophils Relative %: 55.7 %
Platelets: 226 10*3/uL (ref 140–400)
RBC: 4.5 10*6/uL (ref 3.80–5.10)
RDW: 12.3 % (ref 11.0–15.0)
Total Lymphocyte: 29.8 %
WBC mixed population: 373 cells/uL (ref 200–950)
WBC: 4.6 10*3/uL (ref 3.8–10.8)

## 2017-05-24 LAB — SEDIMENTATION RATE: SED RATE: 9 mm/h (ref 0–30)

## 2017-05-24 LAB — C-REACTIVE PROTEIN: CRP: 5 mg/L (ref <8.0)

## 2017-05-24 NOTE — Telephone Encounter (Addendum)
I spoke to patient and to Tammy They have been advised labs normal, and they  Have questions  1. Continue extension splint? This is expensive for her. 2. Continue physical therapy, or can she do exercises at home now? 3. Do you think any of her pain is from her back/ pain goes up leg at times and her gait is off from the knee?  Can you call Tammy to discuss, or I will call her back to discuss. [phone] is Tammys number.

## 2017-05-24 NOTE — Telephone Encounter (Signed)
I spoke to patient told her labs normal. She has asked if she needs to continue using the extension splint. It is very expensive.

## 2017-05-25 ENCOUNTER — Telehealth: Payer: Self-pay | Admitting: Orthopedic Surgery

## 2017-05-25 NOTE — Telephone Encounter (Signed)
Call received via voice mail message from [place], regarding patient's rental knee brace, per Janine - please call [phone] at ext #[phone].

## 2017-06-01 ENCOUNTER — Ambulatory Visit: Payer: Medicare Other | Admitting: Orthopedic Surgery

## 2017-06-04 ENCOUNTER — Telehealth: Payer: Self-pay | Admitting: Orthopedic Surgery

## 2017-06-04 NOTE — Telephone Encounter (Signed)
Patient called this morning to relay that she is seeking a 2nd opinion, and "hope that Dr Aline Brochure will not be upset with her."  Please advise regarding patient's question as to whether shet would need to be released, due to being under post-operative care. Also, does patient need to return for follow up visit (she had been scheduled for Friday, 06/01/17. Aware would also need to sign release.

## 2017-06-04 NOTE — Telephone Encounter (Signed)
This request was completed as records were requested, and were faxed on 05/30/17.

## 2017-06-04 NOTE — Telephone Encounter (Signed)
2 nd opinion procedure check with carol   That is ok

## 2017-06-04 NOTE — Telephone Encounter (Signed)
Called back to patient; notified. Aware to come in to sign release. Records to be faxed to [facility] upon receipt of signed authorization/release form.

## 2017-06-06 NOTE — Telephone Encounter (Signed)
Record request completed on 06/05/17. Patient aware.

## 2017-07-02 ENCOUNTER — Ambulatory Visit (HOSPITAL_COMMUNITY)
Admission: RE | Admit: 2017-07-02 | Discharge: 2017-07-02 | Disposition: A | Discharge status: Home / Self Care | Payer: Medicare Other | Source: Ambulatory Visit | Attending: Family Medicine | Admitting: Family Medicine

## 2017-07-02 ENCOUNTER — Other Ambulatory Visit (HOSPITAL_COMMUNITY): Payer: Self-pay | Admitting: Family Medicine

## 2017-07-02 DIAGNOSIS — M4186 Other forms of scoliosis, lumbar region: Secondary | ICD-10-CM | POA: Insufficient documentation

## 2017-07-02 DIAGNOSIS — I708 Atherosclerosis of other arteries: Secondary | ICD-10-CM | POA: Insufficient documentation

## 2017-07-02 DIAGNOSIS — M545 Low back pain: Secondary | ICD-10-CM | POA: Diagnosis present

## 2017-08-07 ENCOUNTER — Telehealth: Payer: Self-pay | Admitting: Orthopedic Surgery

## 2017-08-07 ENCOUNTER — Telehealth: Payer: Self-pay | Admitting: Radiology

## 2017-08-07 DIAGNOSIS — Z96651 Presence of right artificial knee joint: Secondary | ICD-10-CM

## 2017-08-07 DIAGNOSIS — T8482XD Fibrosis due to internal orthopedic prosthetic devices, implants and grafts, subsequent encounter: Secondary | ICD-10-CM

## 2017-08-07 NOTE — Telephone Encounter (Signed)
-----   Message from Carole Civil, MD sent at 08/07/2017 10:47 AM EDT ----- proceed ----- Message ----- From: Candice Camp, RT Sent: 08/07/2017   8:26 AM To: Carole Civil, MD  Patient had her 2nd opinion with Dr Synthia Innocent. He indicated she still lacks 15 degrees to get full extension, her flexion was 118 degrees. He has recommended she go back into the JAS brace, can we send order for this?  He has also stated if the brace does not work after several months, she could consider botox injections behind the knee, and this may help her get more motion. They do these there at [facility], but he wants you to order the JAS splint first.

## 2017-08-07 NOTE — Telephone Encounter (Signed)
He had second opinion and was recommended she go back into the JAS extension splint. I have called them to advise.

## 2017-08-07 NOTE — Telephone Encounter (Signed)
Call/voice message received from Katie at [facility] - ph# [phone], asking for clarification on the faxed order today.  Please advise.

## 2017-08-07 NOTE — Telephone Encounter (Signed)
Have provided order to daughter Lynelle Smoke and have faxed for her. To [facility]

## 2017-08-28 ENCOUNTER — Telehealth: Payer: Self-pay | Admitting: Orthopedic Surgery

## 2017-08-28 NOTE — Telephone Encounter (Signed)
As per request, regarding brace, form and notes/release authorization faxed to [facility] [phone]. Patient aware of status.

## 2017-09-21 ENCOUNTER — Ambulatory Visit: Payer: Medicare Other | Admitting: Orthopedic Surgery

## 2017-09-21 VITALS — BP 140/79 | HR 57 | Ht 65.0 in | Wt 170.0 lb

## 2017-09-21 DIAGNOSIS — Z9889 Other specified postprocedural states: Secondary | ICD-10-CM

## 2017-09-21 DIAGNOSIS — T8482XD Fibrosis due to internal orthopedic prosthetic devices, implants and grafts, subsequent encounter: Secondary | ICD-10-CM

## 2017-09-21 NOTE — Progress Notes (Signed)
Chief Complaint  Patient presents with  . Follow-up    Recheck on right knee, MUA, 04-05-17.    History 72 year old female status post manipulation of the right knee after total knee.  She did not get the brace for extension.  It was too expensive.  She is sleeping at night she is having no significant pain she says she is walking well and her knee has improved  Her exam shows flexion is 114 degrees measured with a goniometer her incision looks good she has no swelling or tenderness around the knee she does have a 12 degree flexion contracture.  Her gait looks good  Recommend follow-up for annual x-rays in December call us if any problems

## 2018-02-18 ENCOUNTER — Ambulatory Visit (INDEPENDENT_AMBULATORY_CARE_PROVIDER_SITE_OTHER): Payer: Medicare Other

## 2018-02-18 ENCOUNTER — Encounter: Payer: Self-pay | Admitting: Orthopedic Surgery

## 2018-02-18 ENCOUNTER — Ambulatory Visit: Payer: Medicare Other | Admitting: Orthopedic Surgery

## 2018-02-18 VITALS — BP 138/84 | HR 58 | Ht 65.0 in | Wt 185.0 lb

## 2018-02-18 DIAGNOSIS — Z96651 Presence of right artificial knee joint: Secondary | ICD-10-CM | POA: Diagnosis not present

## 2018-02-18 DIAGNOSIS — T8482XD Fibrosis due to internal orthopedic prosthetic devices, implants and grafts, subsequent encounter: Secondary | ICD-10-CM

## 2018-02-18 NOTE — Progress Notes (Signed)
ANNUAL FOLLOW UP FOR  RIGHT  TKA   Chief Complaint  Patient presents with  . Routine Post Op    TK Right DOS 02/13/18  Manipulation 04/05/17     HPI: The patient is here for the annual  follow-up x-ray for knee replacement.  This was comp gated by arthrofibrosis followed by manipulation and a long course of physical therapy to get her to where she is now.  She does have a little trouble putting on her socks but she regained all but 5 degrees of extension and she has 115 degrees of knee flexion.   Review of Systems  Constitutional: Negative for fever.  Neurological: Negative for tingling and sensory change.    Past Medical History:  Diagnosis Date  . Arthritis   . Asthma   . Chest pain    w abnormal ECg  . Chronic headaches    migraine  . Dyslipidemia   . Dyspnea   . Glucose intolerance (impaired glucose tolerance)   . Hemorrhoids 03/25/2015  . Hypothyroidism   . Seizure disorder ([place])    on Lamictal; has not had seizure in 15 years  . Seizures ([place])   . Sleep apnea    lost weight and does not need CPAP any more.  . Varicose veins 03/25/2015  . Venous insufficiency      Examination of the RIGHT KNEE  BP 138/84   Pulse (!) 58   Ht 5\' 5"  (1.651 m)   Wt 185 lb (83.9 kg)   BMI 30.79 kg/m   General the patient is normally groomed in no distress  Mood normal Affect pleasant   The patient is Awake and alert ; oriented normal   Inspection shows : incision healed nicely without erythema, no tenderness no swelling  Range of motion total range of motion is 5-115  Stability the knee is stable anterior to posterior as well as medial to lateral  Strength quadriceps strength is normal  Skin no erythema around the skin incision  Cardiovascular NO EDEMA   Neuro: normal sensation in the operative leg   Gait: normal expected gait without cane    Medical decision-making section  X-rays ordered with the following personal interpretation  Normal alignment without  loosening   Diagnosis  Encounter Diagnoses  Name Primary?  Marland Kitchen Arthrofibrosis of total knee replacement, subsequent encounter 02/03/18   . S/P total knee replacement, right 02/13/17 Yes     Plan follow-up 1 year repeat x-rays

## 2018-09-26 ENCOUNTER — Other Ambulatory Visit (HOSPITAL_COMMUNITY): Payer: Self-pay | Admitting: Family Medicine

## 2018-09-26 DIAGNOSIS — Z1231 Encounter for screening mammogram for malignant neoplasm of breast: Secondary | ICD-10-CM

## 2018-10-31 ENCOUNTER — Ambulatory Visit (HOSPITAL_COMMUNITY): Payer: Medicare Other

## 2018-11-13 IMAGING — DX DG CHEST 2V
2 series · 2 of 2 positions shown · non-contrast
Comparison: 10/24/2013

CLINICAL DATA: Preop knee replacement

EXAM:
CHEST  2 VIEW

[chest pa]
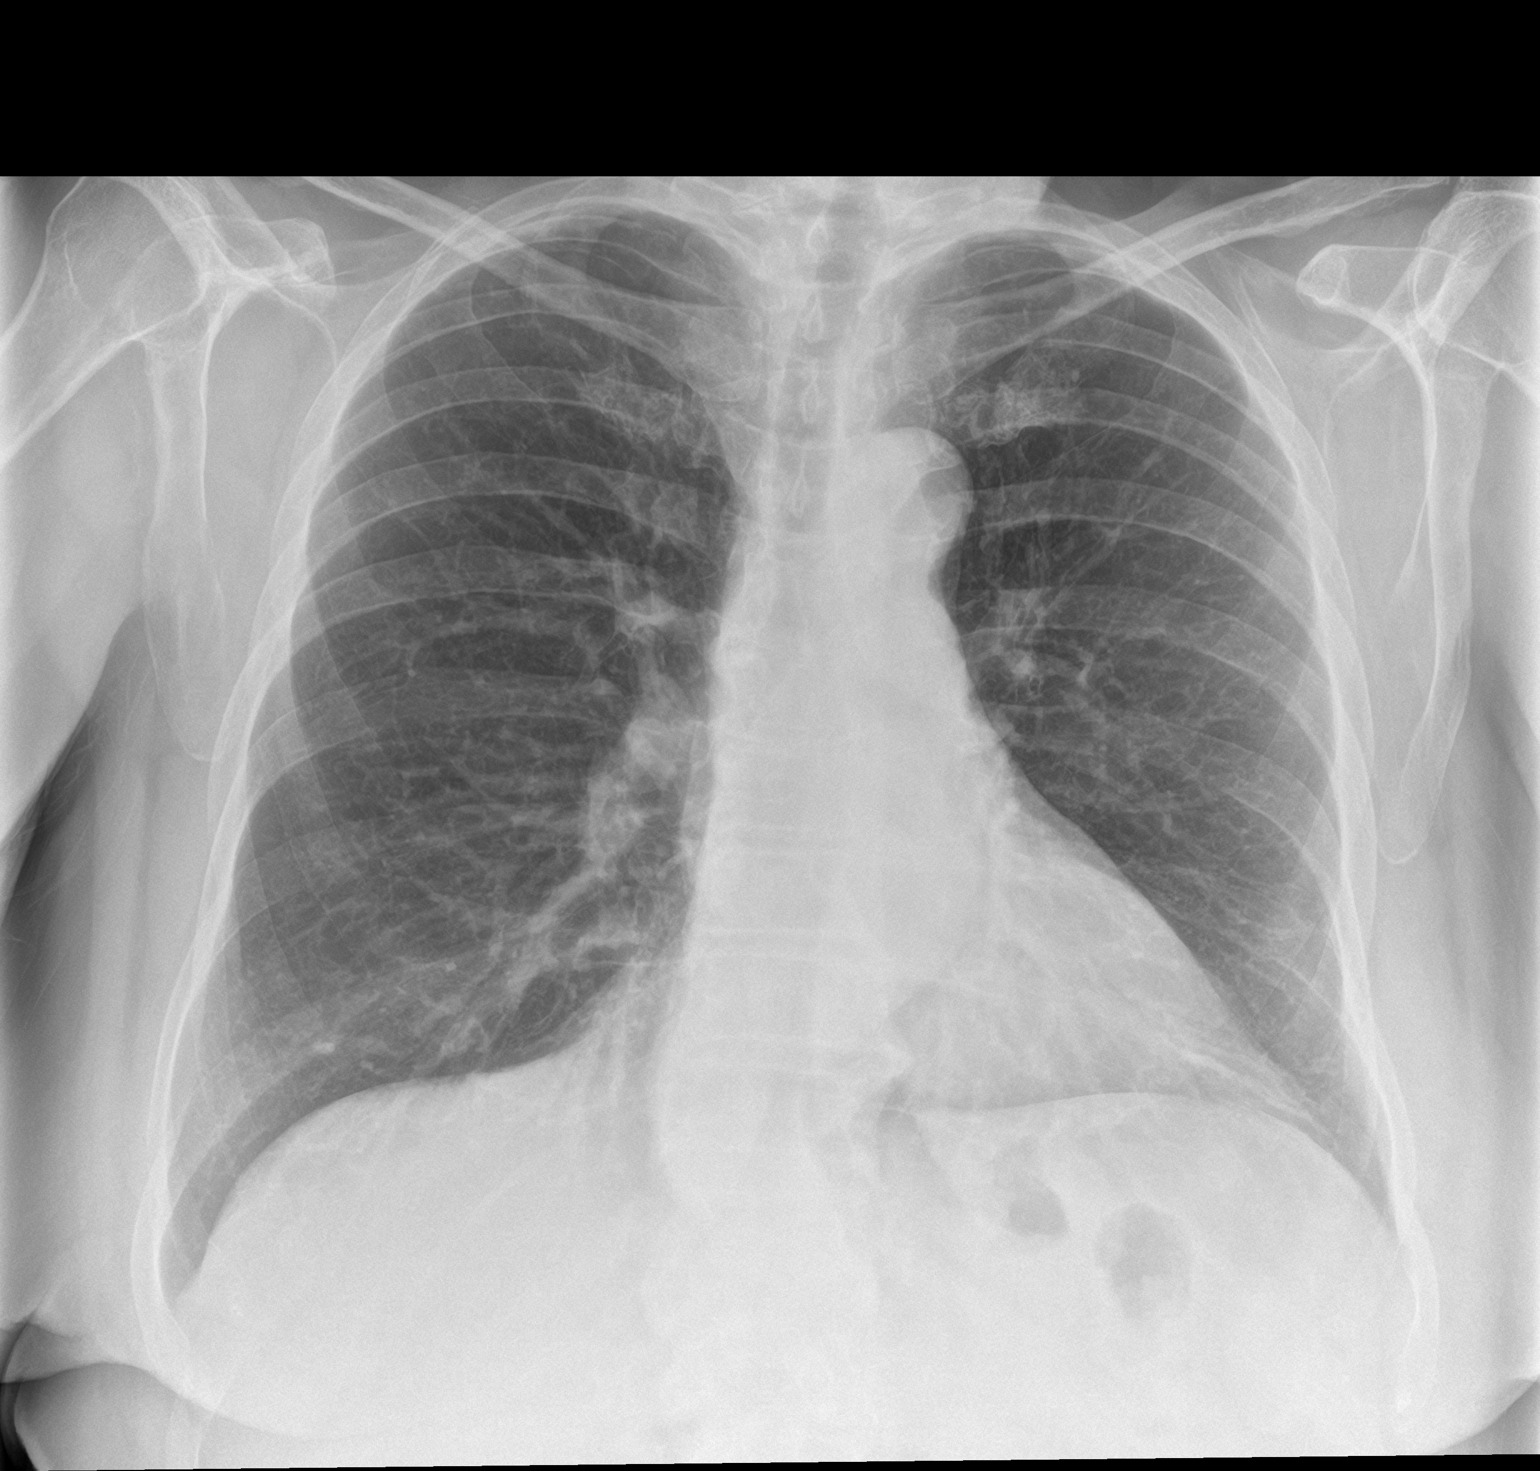

[chest lat]
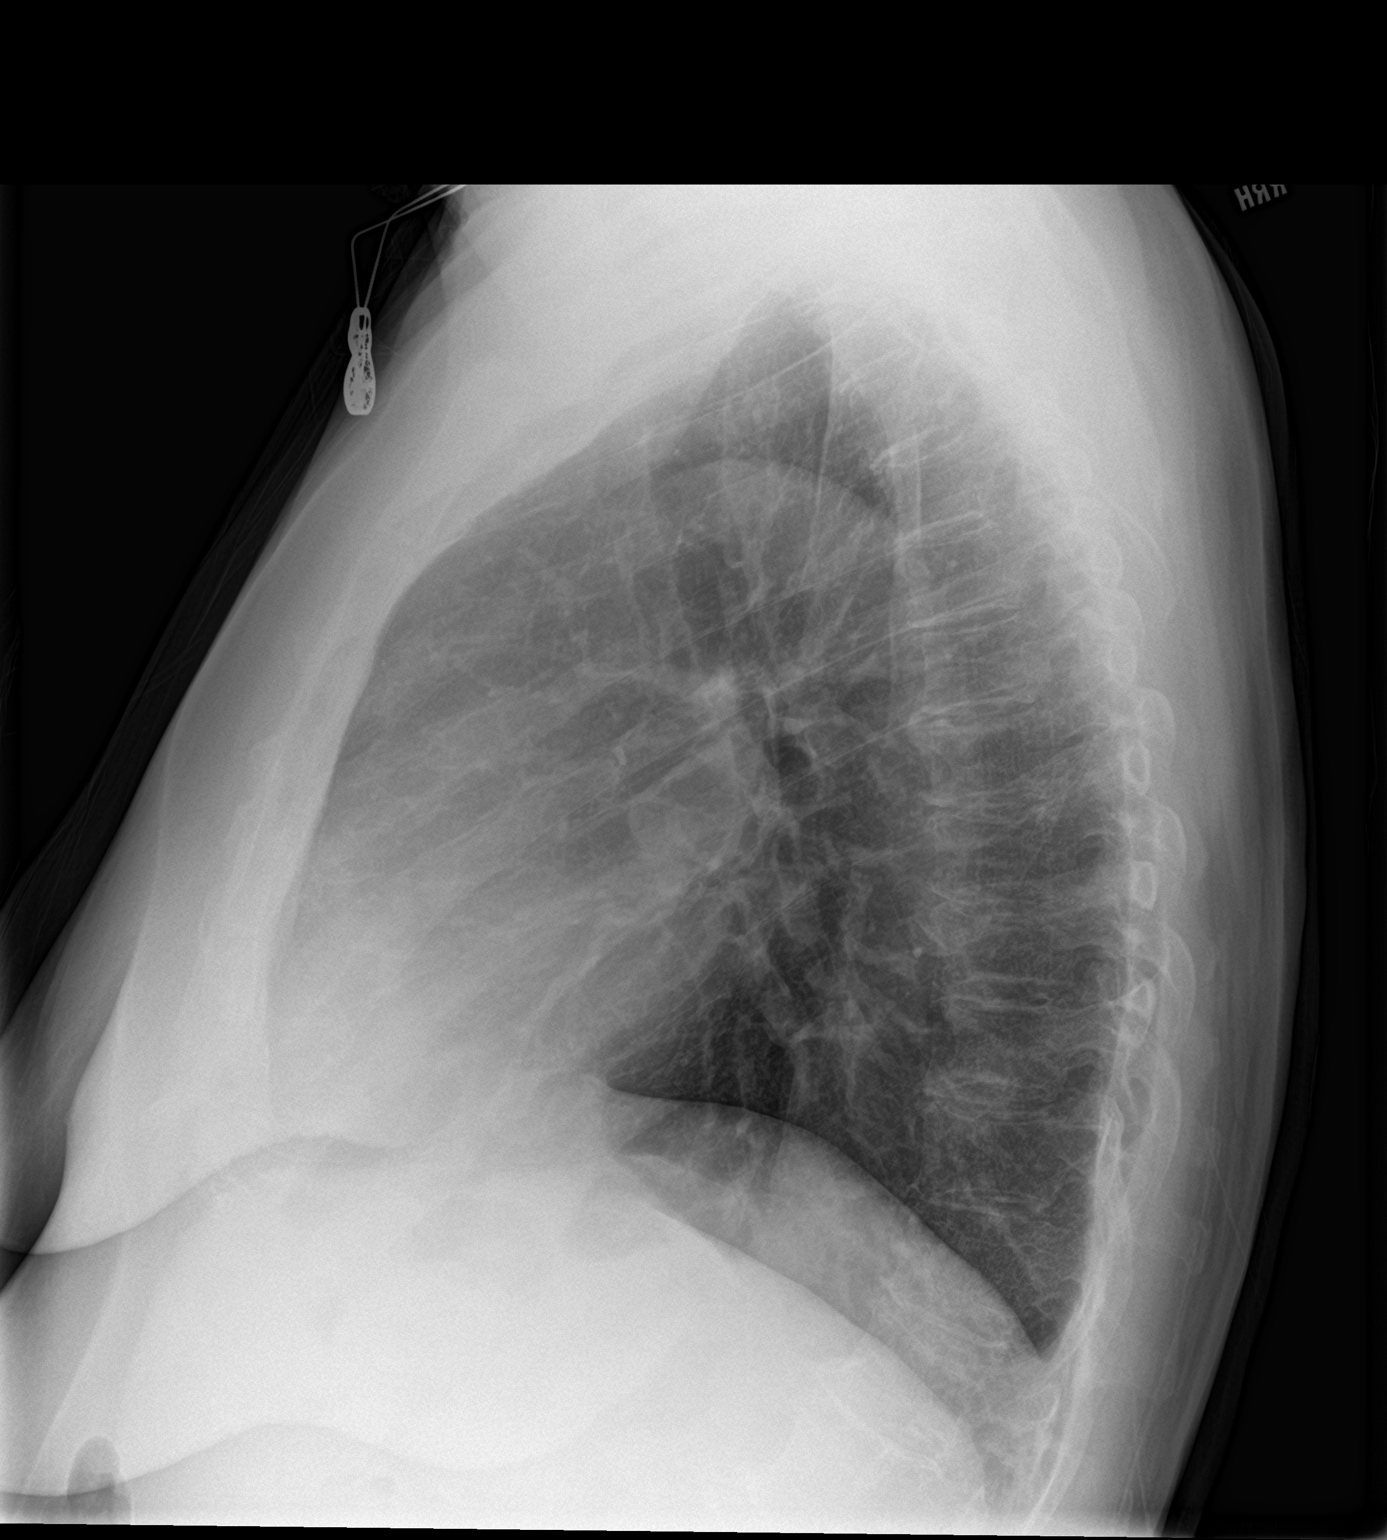

[2 of 2 positions shown; findings below may reference images not displayed]

FINDINGS: Heart and mediastinal contours are within normal limits. No focal
opacities or effusions. No acute bony abnormality. Biapical
scarring. Lungs otherwise clear. Heart is normal size. No effusions
or acute bony abnormality.
IMPRESSION: No active cardiopulmonary disease.

## 2018-11-25 IMAGING — DX DG CHEST 2V
2 series · 2 of 2 positions shown · non-contrast
Comparison: PA and lateral chest 02/08/2017.

CLINICAL DATA: Left chest pain since this morning.

EXAM:
CHEST  2 VIEW

[chest pa]
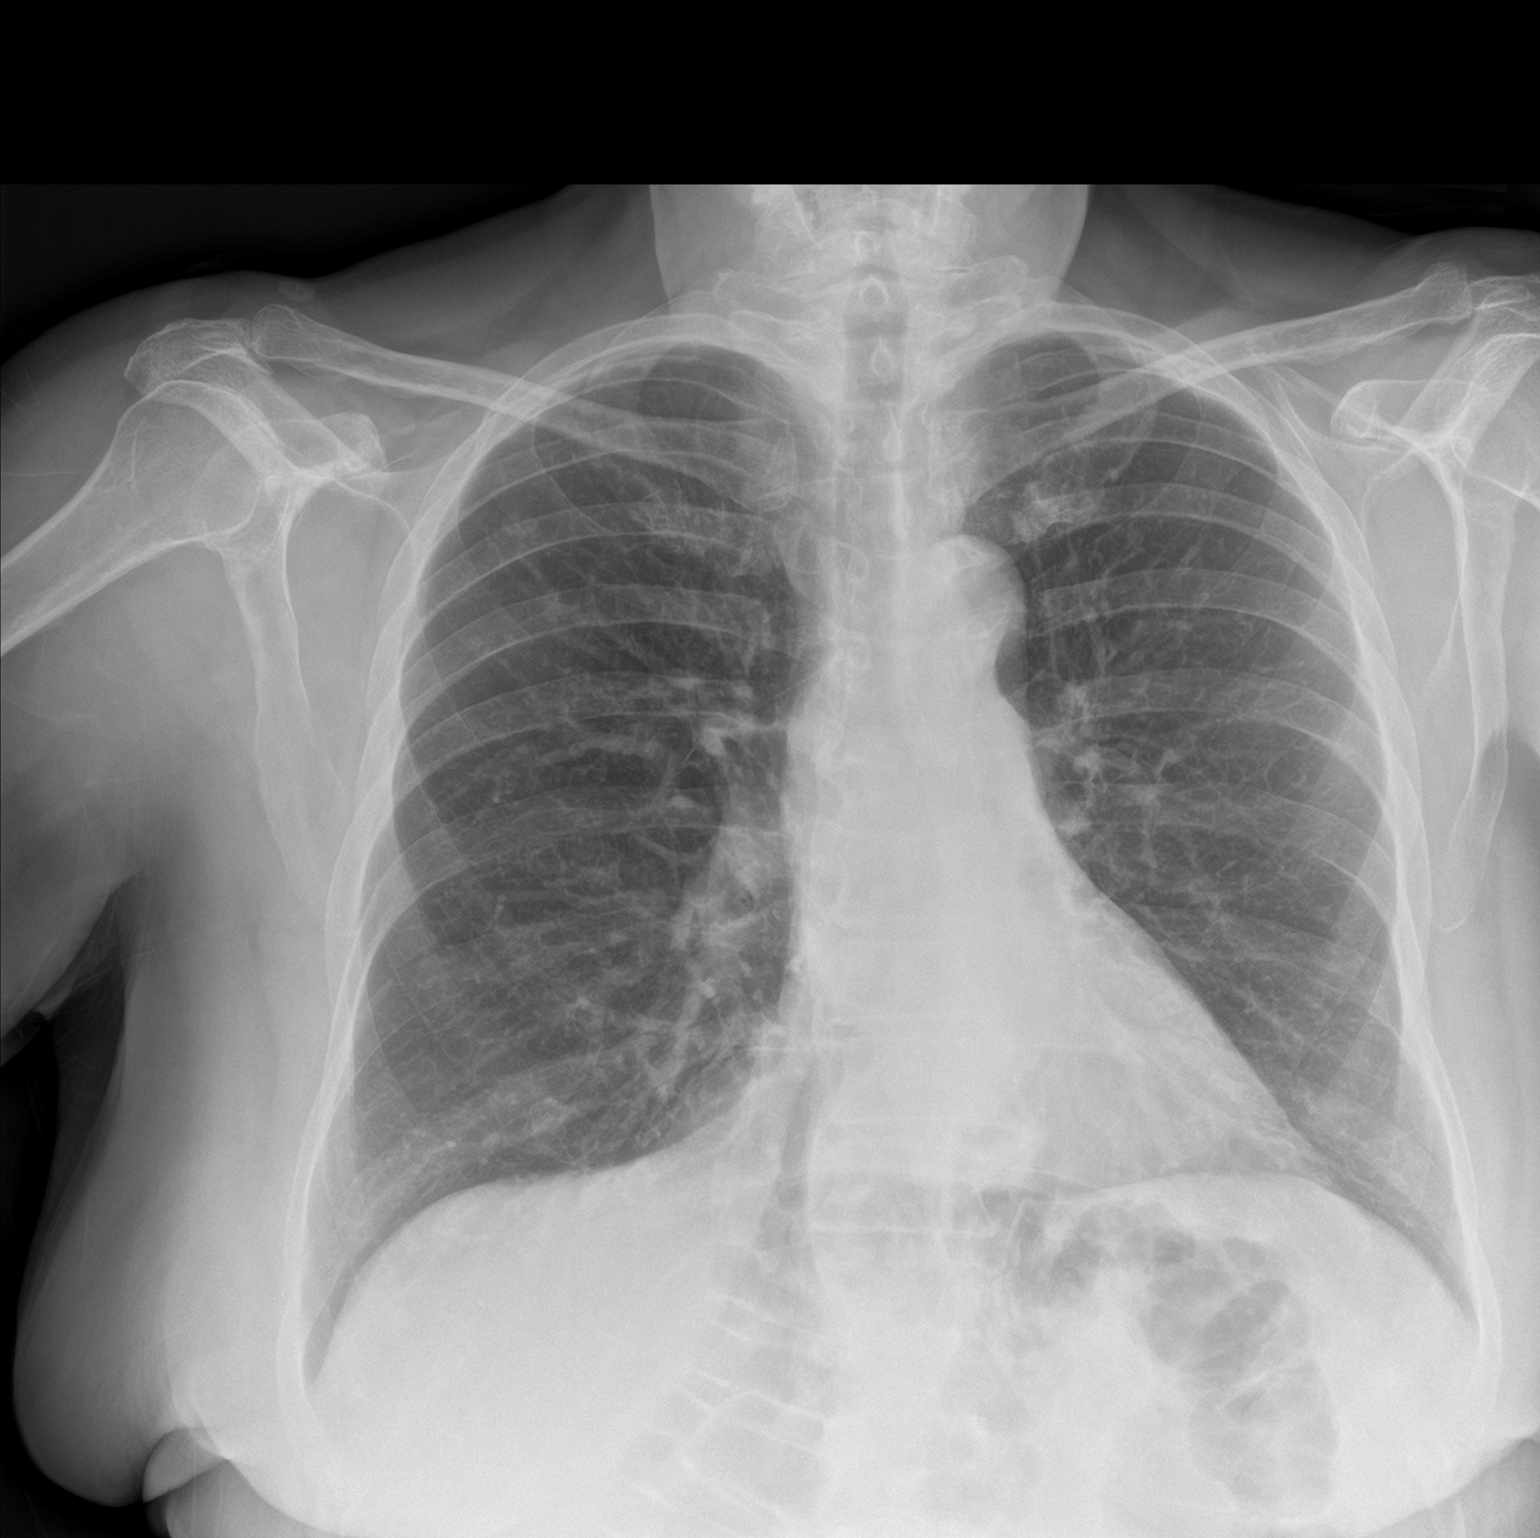

[chest lat]
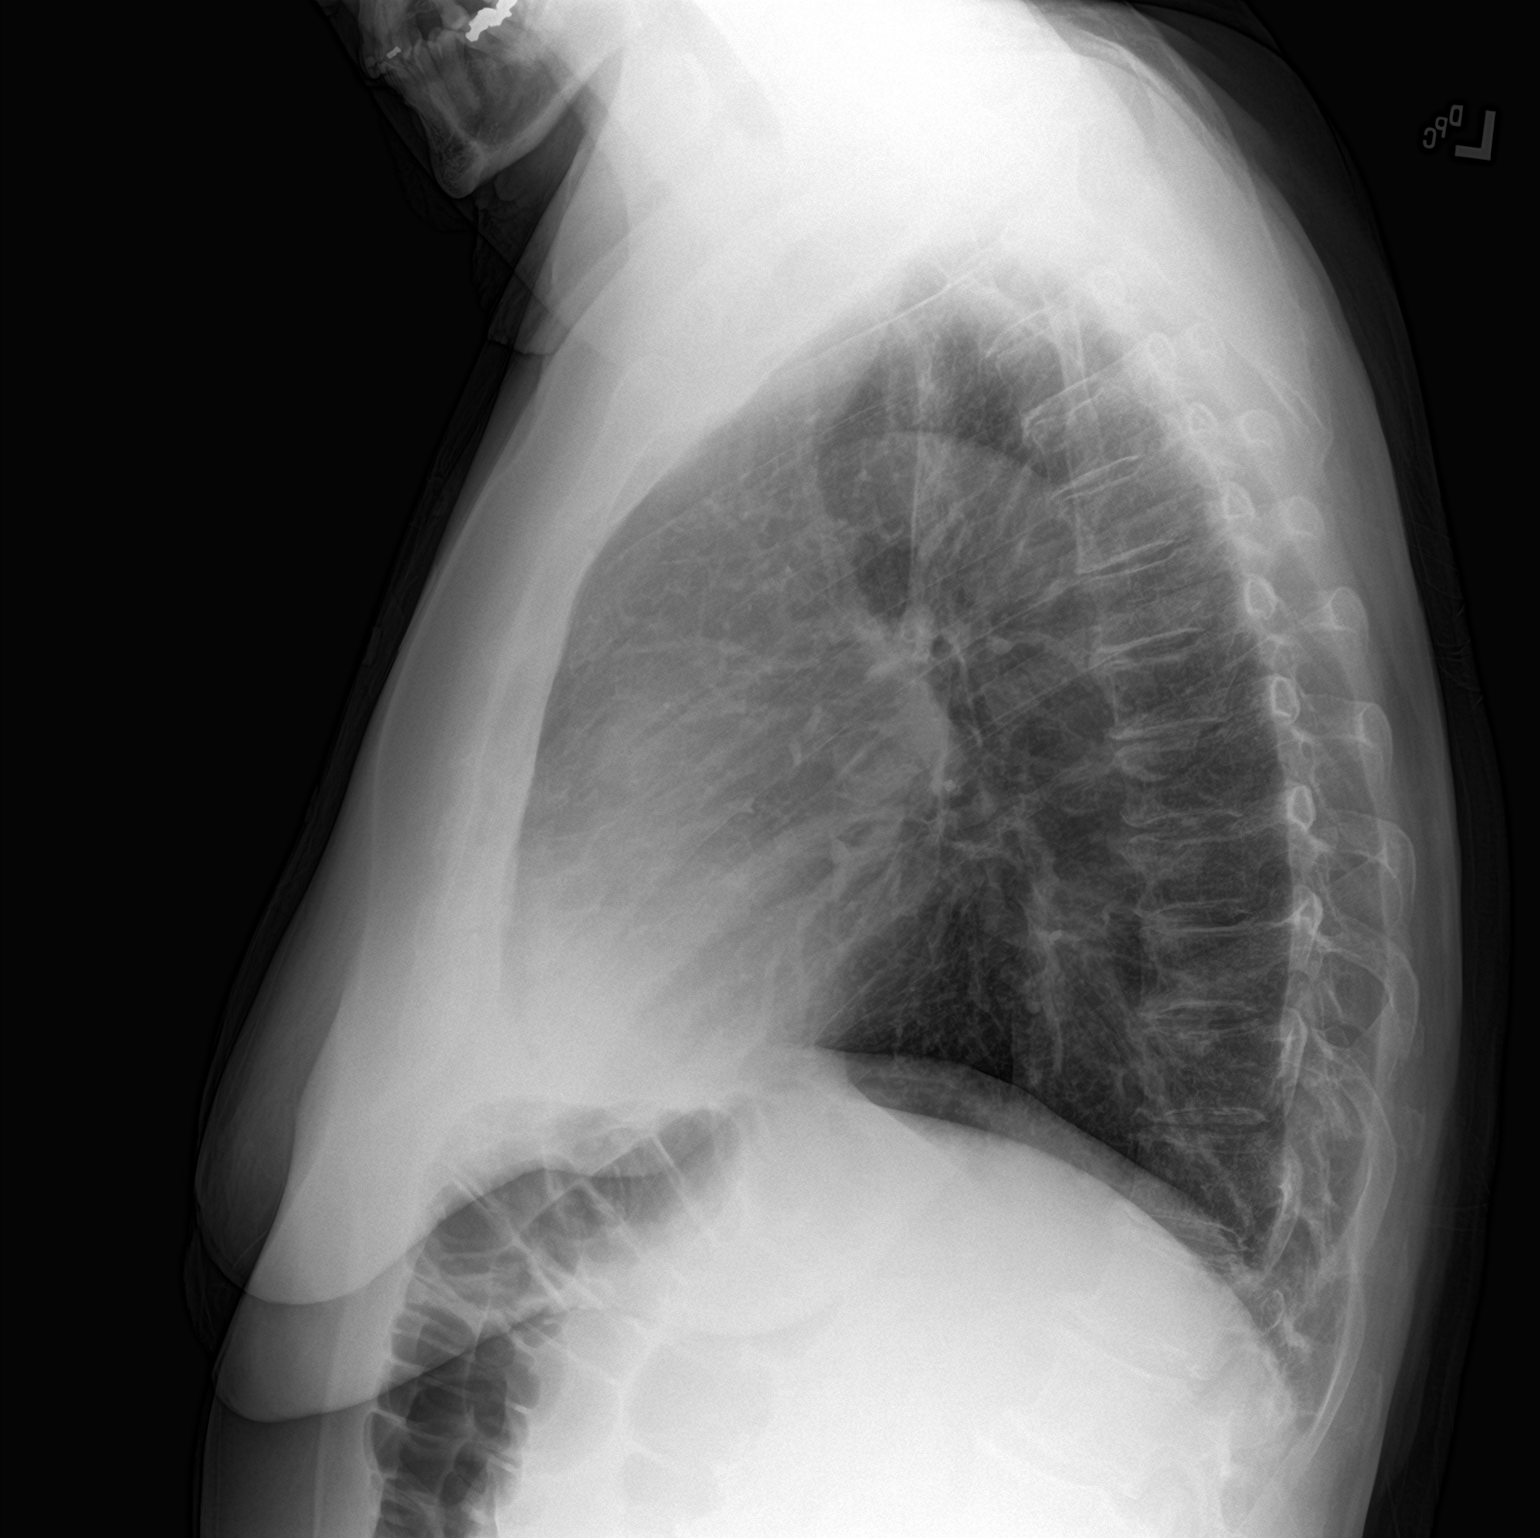

[2 of 2 positions shown; findings below may reference images not displayed]

FINDINGS: Lungs are clear. Heart size is normal. No pneumothorax or pleural
fluid. Aortic atherosclerosis noted. No acute bony abnormality.
IMPRESSION: No acute disease.

Atherosclerosis.

## 2019-02-24 ENCOUNTER — Ambulatory Visit: Payer: Medicare Other | Admitting: Orthopedic Surgery

## 2019-03-12 ENCOUNTER — Other Ambulatory Visit: Payer: Self-pay

## 2019-03-12 ENCOUNTER — Ambulatory Visit: Payer: Medicare Other

## 2019-03-12 ENCOUNTER — Ambulatory Visit: Payer: Medicare Other | Admitting: Orthopedic Surgery

## 2019-03-12 VITALS — BP 149/67 | HR 64 | Temp 97.5°F | Ht 66.0 in | Wt 220.0 lb

## 2019-03-12 DIAGNOSIS — Z96651 Presence of right artificial knee joint: Secondary | ICD-10-CM

## 2019-03-12 DIAGNOSIS — M170 Bilateral primary osteoarthritis of knee: Secondary | ICD-10-CM

## 2019-03-12 DIAGNOSIS — M1710 Unilateral primary osteoarthritis, unspecified knee: Secondary | ICD-10-CM

## 2019-03-12 NOTE — Progress Notes (Signed)
ANNUAL FOLLOW UP FOR  RIGHT  TKA   Chief Complaint  Patient presents with  . Follow-up    Recheck on right knee, DOS 02-13-17.   DEPUY:  sigma fixed bearing right knee.   HPI: H/O ARTHROFIBROSIS TREATED WITH MANIPULATION AND JAS SPLINT    The patient is here for the annual  follow-up x-ray for knee replacement. The patient is not complaining of pain weakness instability or stiffness in the repaired knee.   ROS   Burning itching left knee status post fall.  Left knee examination shows a small hematoma tender area which reproduces the burning probable neurofibroma in the skin recommend observation and excision if becomes problematic   Examination of the RIGHT  KNEE  BP (!) 149/67   Pulse 64   Temp (!) 97.5 F (36.4 C)   Ht 5\' 6"  (1.676 m)   Wt 220 lb (99.8 kg)   BMI 35.51 kg/m   General the patient is normally groomed in no distress  Range of motion total range of motion is 115  Stability the knee is stable anterior to posterior as well as medial to lateral  Strength quadriceps strength is normal    Neuro: normal sensation in the operative leg   Gait: normal expected gait without cane    Medical decision-making section  X-rays ordered, internal imaging shows (see full dictated report) stable implant with no signs of loosening  Diagnosis  Encounter Diagnoses  Name Primary?  . S/P total knee replacement, right 02/13/17 Yes  . Arthritis of knee      Plan follow-up 1 year repeat x-rays

## 2019-03-12 NOTE — Patient Instructions (Signed)
Your knee implant is holding up well and there are no signs of loosening  Annual follow-up is all that is recommended at this time

## 2019-03-26 ENCOUNTER — Other Ambulatory Visit: Payer: Self-pay

## 2019-03-26 ENCOUNTER — Encounter (HOSPITAL_COMMUNITY): Payer: Self-pay | Admitting: Emergency Medicine

## 2019-03-26 ENCOUNTER — Emergency Department (HOSPITAL_COMMUNITY)
Admission: EM | Admit: 2019-03-26 | Discharge: 2019-03-26 | Disposition: A | Discharge status: Home / Self Care | Payer: Medicare Other | Attending: Emergency Medicine | Admitting: Emergency Medicine

## 2019-03-26 ENCOUNTER — Emergency Department (HOSPITAL_COMMUNITY): Payer: Medicare Other

## 2019-03-26 DIAGNOSIS — R0789 Other chest pain: Secondary | ICD-10-CM | POA: Diagnosis present

## 2019-03-26 DIAGNOSIS — Z96651 Presence of right artificial knee joint: Secondary | ICD-10-CM | POA: Diagnosis not present

## 2019-03-26 DIAGNOSIS — E039 Hypothyroidism, unspecified: Secondary | ICD-10-CM | POA: Insufficient documentation

## 2019-03-26 DIAGNOSIS — Z20822 Contact with and (suspected) exposure to covid-19: Secondary | ICD-10-CM | POA: Diagnosis not present

## 2019-03-26 DIAGNOSIS — J45909 Unspecified asthma, uncomplicated: Secondary | ICD-10-CM | POA: Insufficient documentation

## 2019-03-26 DIAGNOSIS — Z79899 Other long term (current) drug therapy: Secondary | ICD-10-CM | POA: Insufficient documentation

## 2019-03-26 DIAGNOSIS — Z87891 Personal history of nicotine dependence: Secondary | ICD-10-CM | POA: Diagnosis not present

## 2019-03-26 DIAGNOSIS — R079 Chest pain, unspecified: Secondary | ICD-10-CM

## 2019-03-26 LAB — BASIC METABOLIC PANEL
Anion gap: 8 (ref 5–15)
BUN: 15 mg/dL (ref 8–23)
CO2: 26 mmol/L (ref 22–32)
Calcium: 9.4 mg/dL (ref 8.9–10.3)
Chloride: 103 mmol/L (ref 98–111)
Creatinine, Ser: 0.68 mg/dL (ref 0.44–1.00)
GFR calc Af Amer: >60 mL/min (ref >60)
GFR calc non Af Amer: >60 mL/min (ref >60)
Glucose, Bld: 83 mg/dL (ref 70–99)
Potassium: 4 mmol/L (ref 3.5–5.1)
Sodium: 137 mmol/L (ref 135–145)

## 2019-03-26 LAB — CBC
HCT: 43.5 % (ref 36.0–46.0)
Hemoglobin: 14.2 g/dL (ref 12.0–15.0)
MCH: 30.3 pg (ref 26.0–34.0)
MCHC: 32.6 g/dL (ref 30.0–36.0)
MCV: 92.8 fL (ref 80.0–100.0)
Platelets: 230 10*3/uL (ref 150–400)
RBC: 4.69 MIL/uL (ref 3.87–5.11)
RDW: 13 % (ref 11.5–15.5)
WBC: 4.4 10*3/uL (ref 4.0–10.5)
nRBC: 0 % (ref 0.0–0.2)

## 2019-03-26 LAB — TROPONIN I (HIGH SENSITIVITY)
Troponin I (High Sensitivity): 3 ng/L (ref <18)
Troponin I (High Sensitivity): 4 ng/L (ref <18)

## 2019-03-26 MED ORDER — SODIUM CHLORIDE 0.9% FLUSH: 3.0000 mL | Freq: Once | INTRAVENOUS | Status: DC

## 2019-03-26 NOTE — Discharge Instructions (Addendum)
Please follow-up with your primary care provider regarding today's encounter.  You may want to consider discussing possible medication for anxiety as that may be a contributing factor.  Given the normal findings on today's physical exam in addition to your comprehensive work-up, recommend that you also follow-up with her primary care provider to discuss possible stress-related chest discomfort and possible strategies for mitigating those symptoms.  Please return to the ED or seek medical attention immediately for any new or worsening symptoms.

## 2019-03-26 NOTE — ED Notes (Signed)
Went into room to place patient on cardiac monitor - patient advised "I have already been seen and no need for the monitor". I advised her that the dr ordered cardiac monitoring and she again stated "I have already been seen".

## 2019-03-26 NOTE — ED Provider Notes (Signed)
Medical screening examination/treatment/procedure(s) were conducted as a shared visit with non-physician practitioner(s) and myself.  I personally evaluated the patient during the encounter.  Clinical Impression:   Final diagnoses:  Nonspecific chest pain    This patient is a well-appearing 74 year old female with no prior history of cardiac disease presenting with approximately 4 days of a rather persistent but fluctuating in intensity chest discomfort which seems to be a heaviness across her chest and her upper back.  This is not exertional positional or related to any other symptoms.  She has not had fevers coughing or shortness of breath.  On my exam she has totally normal clear heart and lung sounds, no edema, no rales, she appears very well.  I have personally looked at and interpreted her EKG which is unremarkable.  She will need a second troponin is the first 1 is totally normal, she is agreeable to the plan, she will need an outpatient Covid test, she can follow-up in the outpatient setting if she is Covid positive however at this time she is very well-appearing  If everything is normal she will need a follow-up this week with cardiology  Second EKG noted below is slower but unchanged compared to prior from earlier in the visit   EKG Interpretation  Date/Time:  Wednesday March 26 2019 18:47:52 EST Ventricular Rate:  61 PR Interval:  154 QRS Duration: 97 QT Interval:  407 QTC Calculation: 410 R Axis:   4 Text Interpretation: Sinus rhythm Low voltage, precordial leads Consider anterior infarct Baseline wander in lead(s) V6 Since last tracing rate slower Confirmed by Noemi Chapel [phone]) on 03/26/2019 6:49:55 PM          Noemi Chapel, MD 03/27/19 [phone]

## 2019-03-26 NOTE — ED Provider Notes (Signed)
[facility] EMERGENCY DEPARTMENT Provider Note   CSN: [id number] Arrival date & time: 03/26/19  1408     History Chief Complaint  Patient presents with  . Chest Pain    Isabella Holmes is a 74 y.o. female with PMH significant for OSA, hypothyroidism, seizure disorder, and asthma who is in the ED with 4-day history of chest heaviness radiating into her back with associated shortness of breath symptoms.  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  She reports that she used to exercise regularly, but has not been able to leave the house often due to her responsibilities with her husband.  No obvious aggravating or relieving factors.  She denies any recent illness, fevers or chills, headache or dizziness, cough, abdominal discomfort, nausea or vomiting, weight change, orthopnea, changes in bowel habits, or any urinary symptoms.  She prides herself on being healthy and only taking her antiseizure and Synthroid medications each day.  She called her PCP regarding her "heaviness" and she was subsequently referred to the ED.  HPI     Past Medical History:  Diagnosis Date  . Arthritis   . Asthma   . Chest pain    w abnormal ECg  . Chronic headaches    migraine  . Dyslipidemia   . Dyspnea   . Glucose intolerance (impaired glucose tolerance)   . Hemorrhoids 03/25/2015  . Hypothyroidism   . Seizure disorder ([place])    on Lamictal; has not had seizure in 15 years  . Seizures ([place])   . Sleep apnea    lost weight and does not need CPAP any more.  . Varicose veins 03/25/2015  . Venous insufficiency     Patient Active Problem List   Diagnosis Date Noted  . Arthrofibrosis of total knee replacement right ([place]) s/p Manipulation 04/05/17   . S/P total knee replacement, right 02/13/17 02/27/2017  . Primary osteoarthritis of one knee, right 02/13/2017  . Encounter for screening colonoscopy 01/31/2017    . Derangement of posterior horn of medial meniscus of right knee   . Varicose veins 03/25/2015  . Hemorrhoids 03/25/2015  . Vulvar abscess 02/12/2015  . OVERWEIGHT 01/06/2009  . ANXIETY STATE, UNSPECIFIED 01/06/2009  . HYPERTENSION 01/06/2009  . ESOPHAGEAL REFLUX 01/06/2009  . SEIZURE DISORDER 01/06/2009  . HYPERSOMNIA WITH SLEEP APNEA UNSPECIFIED 01/06/2009  . UNSPECIFIED SLEEP APNEA 01/06/2009  . OTHER CHEST PAIN 01/06/2009    Past Surgical History:  Procedure Laterality Date  . EXAM UNDER ANESTHESIA WITH MANIPULATION OF KNEE Right 04/05/2017   Procedure: EXAM UNDER ANESTHESIA WITH MANIPULATION OF RIGHT KNEE;  Surgeon: Carole Civil, MD;  Location: [facility];  Service: Orthopedics;  Laterality: Right;  . KNEE ARTHROSCOPY Left   . KNEE ARTHROSCOPY WITH MEDIAL MENISECTOMY Right 06/28/2016   Procedure: KNEE ARTHROSCOPY WITH MEDIAL MENISECTOMY;  Surgeon: Carole Civil, MD;  Location: [facility];  Service: Orthopedics;  Laterality: Right;  . left vein stripping    . status post bilateral tubaligation    . TONSILLECTOMY     many years ago  . TOTAL KNEE ARTHROPLASTY Right 02/13/2017   Procedure: TOTAL KNEE ARTHROPLASTY;  Surgeon: Carole Civil, MD;  Location: [facility];  Service: Orthopedics;  Laterality: Right;  . TUBAL LIGATION       OB History    Gravida  2   Para  2   Term  Preterm      AB      Living  2     SAB      TAB      Ectopic      Multiple      Live Births              Family History  Problem Relation Age of Onset  . Heart attack Mother   . Tuberculosis Father   . Heart disease Father        cardiac disease  . Cirrhosis Father   . Factor V Leiden deficiency Other   . Seizures Son     Social History   Tobacco Use  . Smoking status: Former Smoker    Packs/day: 2.50    Years: 20.00    Pack years: 50.00    Types: Cigarettes    Quit date: 06/23/1986    Years since quitting: 32.7  . Smokeless tobacco: Never Used  Substance Use  Topics  . Alcohol use: No  . Drug use: No    Home Medications Prior to Admission medications   Medication Sig Start Date End Date Taking? Authorizing Provider  albuterol (PROVENTIL HFA;VENTOLIN HFA) 108 (90 Base) MCG/ACT inhaler Inhale 1-2 puffs into the lungs every 4 (four) hours as needed for wheezing or shortness of breath.    [provider]  aspirin EC 325 MG EC tablet Take 1 tablet (325 mg total) by mouth daily with breakfast. Patient not taking: Reported on 03/12/2019 02/16/17   Carole Civil, MD  cetirizine (ZYRTEC) 10 MG tablet Take 10 mg by mouth as needed for allergies.    [provider]  docusate sodium (COLACE) 100 MG capsule Take 1 capsule (100 mg total) by mouth 2 (two) times daily. Patient not taking: Reported on 03/12/2019 02/15/17   Carole Civil, MD  HYDROcodone-acetaminophen [facility]) 5-325 MG tablet Take 1 tablet by mouth every 6 (six) hours as needed for moderate pain. Patient not taking: Reported on 03/12/2019 05/01/17   Carole Civil, MD  IBU 600 MG tablet Take 1 tablet (600 mg total) by mouth every 6 (six) hours as needed. 03/21/17   Carole Civil, MD  lamoTRIgine (LAMICTAL) 150 MG tablet Take 150 mg by mouth at bedtime.     [provider]  levothyroxine (SYNTHROID, LEVOTHROID) 50 MCG tablet Take 50 mcg by mouth daily before breakfast.  05/19/16   [provider]  methocarbamol (ROBAXIN) 500 MG tablet Take 1 tablet (500 mg total) by mouth 4 (four) times daily. Patient not taking: Reported on 03/12/2019 04/05/17   Carole Civil, MD    Allergies    Levofloxacin, Penicillins, Streptomycin, and Sulfonamide derivatives  Review of Systems   Review of Systems  All other systems reviewed and are negative.   Physical Exam Updated Vital Signs BP (!) 156/91 (BP Location: Right Arm)   Pulse 64   Temp 97.7 F (36.5 C) (Oral)   Resp 16   Ht 5\' 6"  (1.676 m)   Wt 100.7 kg   SpO2 100%   BMI 35.83 kg/m    Physical Exam Vitals and nursing note reviewed. Exam conducted with a chaperone present.  Constitutional:      Appearance: Normal appearance.  HENT:     Head: Normocephalic and atraumatic.  Eyes:     General: No scleral icterus.    Extraocular Movements: Extraocular movements intact.     Conjunctiva/sclera: Conjunctivae normal.     Pupils: Pupils are equal, round,  and reactive to light.  Cardiovascular:     Rate and Rhythm: Normal rate and regular rhythm.     Pulses: Normal pulses.     Heart sounds: Normal heart sounds.  Pulmonary:     Effort: Pulmonary effort is normal. No respiratory distress.     Breath sounds: Normal breath sounds. No wheezing or rales.  Musculoskeletal:     Cervical back: Normal range of motion and neck supple. No rigidity.     Right lower leg: No edema.     Left lower leg: No edema.  Skin:    General: Skin is dry.     Capillary Refill: Capillary refill takes less than 2 seconds.  Neurological:     General: No focal deficit present.     Mental Status: She is alert and oriented to person, place, and time.     GCS: GCS eye subscore is 4. GCS verbal subscore is 5. GCS motor subscore is 6.     Cranial Nerves: No cranial nerve deficit.     Sensory: No sensory deficit.     Motor: No weakness.     Coordination: Coordination normal.     Gait: Gait normal.  Psychiatric:        Mood and Affect: Mood normal.        Behavior: Behavior normal.        Thought Content: Thought content normal.     ED Results / Procedures / Treatments   Labs (all labs ordered are listed, but only abnormal results are displayed) Labs Reviewed  SARS CORONAVIRUS 2 (TAT 6-24 HRS)  BASIC METABOLIC PANEL  CBC  TROPONIN I (HIGH SENSITIVITY)  TROPONIN I (HIGH SENSITIVITY)    EKG EKG Interpretation  Date/Time:  Wednesday March 26 2019 18:47:52 EST Ventricular Rate:  61 PR Interval:  154 QRS Duration: 97 QT Interval:  407 QTC Calculation: 410 R Axis:   4 Text  Interpretation: Sinus rhythm Low voltage, precordial leads Consider anterior infarct Baseline wander in lead(s) V6 Since last tracing rate slower Confirmed by Noemi Chapel [phone]) on 03/26/2019 6:49:55 PM   Radiology DG Chest 2 View  Result Date: 03/26/2019 CLINICAL DATA:  Chest heaviness with radiation to the back. Shortness of breath. EXAM: CHEST - 2 VIEW COMPARISON:  02/20/2017. FINDINGS: Trachea is midline. Heart size normal. Thoracic aorta is calcified. Linear scarring in the left lower lobe. Lungs are otherwise clear. No pleural fluid. IMPRESSION: No acute findings. Electronically Signed   By: Lorin Picket M.D.   On: 03/26/2019 14:42    Procedures Procedures (including critical care time)  Medications Ordered in ED Medications  sodium chloride flush (NS) 0.9 % injection 3 mL (3 mLs Intravenous Not Given 03/26/19 1834)    ED Course  I have reviewed the triage vital signs and the nursing notes.  Pertinent labs & imaging results that were available during my care of the patient were reviewed by me and considered in my medical decision making (see chart for details).    MDM Rules/Calculators/A&P                      I reviewed patient's past medical record and she has no history of echocardiogram or cardiac interventions.  On my exam, patient is resting comfortably and in no acute distress.  She endorses a chest "heaviness" but denies any significant chest pain and has not been particularly limiting.  There was no leg swelling on exam.  She denies any recent immobilization, history  of clots, clotting disorder, leg swelling or discomfort, pleuritic chest pain or pleuritic cough, or other history concerning for pleural embolism.  Her vital signs are all within normal limits.  She is low risk per Wells criteria.   CBC, BMP, and initial troponin all within normal limits.  Repeat troponin hardly increased, from 3-4.  Repeat EKG remained unchanged.  Patient is hemodynamically stable,  neurovascularly intact, and in no acute distress.  DG chest 2 view obtained demonstrates evidence of pneumonia, pneumothorax, or other acute abnormalities.  EKG obtained is also unremarkable.  Patient is reassured by today's work-up.  Will refer her to cardiology and asked that she schedule an appointment in the next few days for ongoing evaluation and management of her reported dyspnea on exertion.  Given the normal findings on today's physical exam in addition to her comprehensive work-up, recommended she also follow-up with her primary care provider to discuss possible anxiety related chest discomfort and possible strategies for mitigating those symptoms.  All of the evaluation and work-up results were discussed with the patient and any family at bedside. They were provided opportunity to ask any additional questions and have none at this time. They have expressed understanding of verbal discharge instructions as well as return precautions and are agreeable to the plan.    Final Clinical Impression(s) / ED Diagnoses Final diagnoses:  Nonspecific chest pain    Rx / DC Orders ED Discharge Orders    None       Corena Herter, PA-C 03/26/19 1927    Noemi Chapel, MD 03/27/19 [phone]

## 2019-03-26 NOTE — ED Triage Notes (Signed)
Patient reports chest heaviness with radiation into her back, also reports assoc. SOB. Onset 4 days ago, no prior cardiac history.

## 2019-03-27 LAB — SARS CORONAVIRUS 2 (TAT 6-24 HRS): SARS Coronavirus 2: NEGATIVE

## 2019-06-12 ENCOUNTER — Other Ambulatory Visit: Payer: Medicare Other | Admitting: Adult Health

## 2020-03-10 ENCOUNTER — Ambulatory Visit: Payer: Medicare Other | Admitting: Orthopedic Surgery

## 2020-03-11 ENCOUNTER — Other Ambulatory Visit: Payer: Self-pay

## 2020-03-11 ENCOUNTER — Ambulatory Visit: Payer: Medicare Other

## 2020-03-11 ENCOUNTER — Encounter: Payer: Self-pay | Admitting: Orthopedic Surgery

## 2020-03-11 ENCOUNTER — Ambulatory Visit: Payer: Medicare Other | Admitting: Orthopedic Surgery

## 2020-03-11 VITALS — BP 160/87 | HR 69 | Ht 66.0 in | Wt 225.0 lb

## 2020-03-11 DIAGNOSIS — Z96651 Presence of right artificial knee joint: Secondary | ICD-10-CM

## 2020-03-11 DIAGNOSIS — M79671 Pain in right foot: Secondary | ICD-10-CM | POA: Diagnosis not present

## 2020-03-11 NOTE — Progress Notes (Signed)
ANNUAL FOLLOW UP FOR rightTKA   Chief Complaint  Patient presents with  . Knee Pain    S/p right knee 02/13/17.      HPI: The patient is here for the annual  follow-up x-ray for knee replacement. The patient is not complaining of pain weakness instability or stiffness in the repaired knee.   Review of Systems  Constitutional: Negative for fever.  Skin: Negative for rash.    Examination of the right KNEE  BP (!) 160/87   Pulse 69   Ht 5\' 6"  (1.676 m)   Wt 225 lb (102.1 kg)   BMI 36.32 kg/m   General the patient is normally groomed in no distress  Inspection shows : incision healed nicely without erythema, no tenderness no swelling  Range of motion total range of motion is 0-125  Stability the knee is stable anterior to posterior as well as medial to lateral  Strength quadriceps strength is normal  Skin no erythema around the skin incision  Neuro: normal sensation in the operative leg   Gait: normal expected gait without cane    Medical decision-making section  X-rays ordered, internal imaging shows (see full dictated report) stable implant with no signs of loosening  Diagnosis  Encounter Diagnoses  Name Primary?  . S/P total knee replacement, right 02/13/17 Yes  . Pain in right foot      Plan follow-up 2 year repeat x-rays

## 2020-03-26 DIAGNOSIS — R059 Cough, unspecified: Secondary | ICD-10-CM | POA: Diagnosis not present

## 2020-04-08 DIAGNOSIS — H109 Unspecified conjunctivitis: Secondary | ICD-10-CM | POA: Diagnosis not present

## 2020-04-08 DIAGNOSIS — J019 Acute sinusitis, unspecified: Secondary | ICD-10-CM | POA: Diagnosis not present

## 2020-04-26 DIAGNOSIS — H25011 Cortical age-related cataract, right eye: Secondary | ICD-10-CM | POA: Diagnosis not present

## 2020-04-26 DIAGNOSIS — H2511 Age-related nuclear cataract, right eye: Secondary | ICD-10-CM | POA: Diagnosis not present

## 2020-04-26 DIAGNOSIS — H35372 Puckering of macula, left eye: Secondary | ICD-10-CM | POA: Diagnosis not present

## 2020-04-26 DIAGNOSIS — H35033 Hypertensive retinopathy, bilateral: Secondary | ICD-10-CM | POA: Diagnosis not present

## 2020-04-26 DIAGNOSIS — H524 Presbyopia: Secondary | ICD-10-CM | POA: Diagnosis not present

## 2020-05-06 DIAGNOSIS — E782 Mixed hyperlipidemia: Secondary | ICD-10-CM | POA: Diagnosis not present

## 2020-05-06 DIAGNOSIS — Z87891 Personal history of nicotine dependence: Secondary | ICD-10-CM | POA: Diagnosis not present

## 2020-05-06 DIAGNOSIS — E039 Hypothyroidism, unspecified: Secondary | ICD-10-CM | POA: Diagnosis not present

## 2020-05-06 DIAGNOSIS — E7849 Other hyperlipidemia: Secondary | ICD-10-CM | POA: Diagnosis not present

## 2020-05-06 DIAGNOSIS — E559 Vitamin D deficiency, unspecified: Secondary | ICD-10-CM | POA: Diagnosis not present

## 2020-05-10 DIAGNOSIS — E039 Hypothyroidism, unspecified: Secondary | ICD-10-CM | POA: Diagnosis not present

## 2020-05-10 DIAGNOSIS — E785 Hyperlipidemia, unspecified: Secondary | ICD-10-CM | POA: Diagnosis not present

## 2020-05-11 DIAGNOSIS — H2511 Age-related nuclear cataract, right eye: Secondary | ICD-10-CM | POA: Diagnosis not present

## 2020-05-11 DIAGNOSIS — H25811 Combined forms of age-related cataract, right eye: Secondary | ICD-10-CM | POA: Diagnosis not present

## 2020-05-11 DIAGNOSIS — H25011 Cortical age-related cataract, right eye: Secondary | ICD-10-CM | POA: Diagnosis not present

## 2020-05-18 DIAGNOSIS — H2511 Age-related nuclear cataract, right eye: Secondary | ICD-10-CM | POA: Diagnosis not present

## 2020-07-24 DIAGNOSIS — R5383 Other fatigue: Secondary | ICD-10-CM | POA: Diagnosis not present

## 2020-07-24 DIAGNOSIS — R350 Frequency of micturition: Secondary | ICD-10-CM | POA: Diagnosis not present

## 2020-10-13 DIAGNOSIS — L02412 Cutaneous abscess of left axilla: Secondary | ICD-10-CM | POA: Diagnosis not present

## 2020-10-13 DIAGNOSIS — I8390 Asymptomatic varicose veins of unspecified lower extremity: Secondary | ICD-10-CM | POA: Diagnosis not present

## 2020-11-11 DIAGNOSIS — D229 Melanocytic nevi, unspecified: Secondary | ICD-10-CM | POA: Diagnosis not present

## 2020-11-11 DIAGNOSIS — E7849 Other hyperlipidemia: Secondary | ICD-10-CM | POA: Diagnosis not present

## 2020-11-11 DIAGNOSIS — E039 Hypothyroidism, unspecified: Secondary | ICD-10-CM | POA: Diagnosis not present

## 2020-11-11 DIAGNOSIS — E782 Mixed hyperlipidemia: Secondary | ICD-10-CM | POA: Diagnosis not present

## 2020-11-11 DIAGNOSIS — R61 Generalized hyperhidrosis: Secondary | ICD-10-CM | POA: Diagnosis not present

## 2020-11-11 DIAGNOSIS — Z1389 Encounter for screening for other disorder: Secondary | ICD-10-CM | POA: Diagnosis not present

## 2020-11-11 DIAGNOSIS — G4733 Obstructive sleep apnea (adult) (pediatric): Secondary | ICD-10-CM | POA: Diagnosis not present

## 2020-11-26 ENCOUNTER — Emergency Department (HOSPITAL_COMMUNITY)
Admission: EM | Admit: 2020-11-26 | Discharge: 2020-11-26 | Disposition: A | Discharge status: Home / Self Care | Payer: Medicare Other | Attending: Emergency Medicine | Admitting: Emergency Medicine

## 2020-11-26 ENCOUNTER — Emergency Department (HOSPITAL_COMMUNITY): Payer: Medicare Other

## 2020-11-26 ENCOUNTER — Other Ambulatory Visit: Payer: Self-pay

## 2020-11-26 ENCOUNTER — Encounter (HOSPITAL_COMMUNITY): Payer: Self-pay

## 2020-11-26 DIAGNOSIS — Z20822 Contact with and (suspected) exposure to covid-19: Secondary | ICD-10-CM | POA: Diagnosis not present

## 2020-11-26 DIAGNOSIS — R059 Cough, unspecified: Secondary | ICD-10-CM | POA: Insufficient documentation

## 2020-11-26 DIAGNOSIS — Z7982 Long term (current) use of aspirin: Secondary | ICD-10-CM | POA: Diagnosis not present

## 2020-11-26 DIAGNOSIS — R079 Chest pain, unspecified: Secondary | ICD-10-CM | POA: Diagnosis not present

## 2020-11-26 DIAGNOSIS — E039 Hypothyroidism, unspecified: Secondary | ICD-10-CM | POA: Diagnosis not present

## 2020-11-26 DIAGNOSIS — Z79899 Other long term (current) drug therapy: Secondary | ICD-10-CM | POA: Insufficient documentation

## 2020-11-26 DIAGNOSIS — J45909 Unspecified asthma, uncomplicated: Secondary | ICD-10-CM | POA: Diagnosis not present

## 2020-11-26 DIAGNOSIS — Z87891 Personal history of nicotine dependence: Secondary | ICD-10-CM | POA: Insufficient documentation

## 2020-11-26 DIAGNOSIS — R0602 Shortness of breath: Secondary | ICD-10-CM | POA: Insufficient documentation

## 2020-11-26 DIAGNOSIS — R0789 Other chest pain: Secondary | ICD-10-CM | POA: Diagnosis not present

## 2020-11-26 DIAGNOSIS — I10 Essential (primary) hypertension: Secondary | ICD-10-CM | POA: Insufficient documentation

## 2020-11-26 DIAGNOSIS — Z96651 Presence of right artificial knee joint: Secondary | ICD-10-CM | POA: Insufficient documentation

## 2020-11-26 LAB — BASIC METABOLIC PANEL
Anion gap: 6 (ref 5–15)
BUN: 11 mg/dL (ref 8–23)
CO2: 29 mmol/L (ref 22–32)
Calcium: 9.2 mg/dL (ref 8.9–10.3)
Chloride: 103 mmol/L (ref 98–111)
Creatinine, Ser: 0.84 mg/dL (ref 0.44–1.00)
GFR, Estimated: >60 mL/min (ref >60)
Glucose, Bld: 97 mg/dL (ref 70–99)
Potassium: 4 mmol/L (ref 3.5–5.1)
Sodium: 138 mmol/L (ref 135–145)

## 2020-11-26 LAB — TROPONIN I (HIGH SENSITIVITY)
Troponin I (High Sensitivity): 6 ng/L (ref <18)
Troponin I (High Sensitivity): 6 ng/L (ref <18)

## 2020-11-26 LAB — CBC
HCT: 39.7 % (ref 36.0–46.0)
Hemoglobin: 13 g/dL (ref 12.0–15.0)
MCH: 30.8 pg (ref 26.0–34.0)
MCHC: 32.7 g/dL (ref 30.0–36.0)
MCV: 94.1 fL (ref 80.0–100.0)
Platelets: 198 10*3/uL (ref 150–400)
RBC: 4.22 MIL/uL (ref 3.87–5.11)
RDW: 13.1 % (ref 11.5–15.5)
WBC: 3.4 10*3/uL — ABNORMAL LOW (ref 4.0–10.5)
nRBC: 0 % (ref 0.0–0.2)

## 2020-11-26 LAB — RESP PANEL BY RT-PCR (FLU A&B, COVID) ARPGX2
Influenza A by PCR: NEGATIVE
Influenza B by PCR: NEGATIVE
SARS Coronavirus 2 by RT PCR: NEGATIVE

## 2020-11-26 MED ORDER — KETOROLAC TROMETHAMINE 30 MG/ML IJ SOLN
15.0000 mg | Freq: Once | INTRAMUSCULAR | Status: AC
  Administered 2020-11-26: 15 mg via INTRAVENOUS
  Filled 2020-11-26: qty 1

## 2020-11-26 MED ORDER — DOXYCYCLINE HYCLATE 100 MG PO CAPS: 100.0000 mg | ORAL_CAPSULE | Freq: Two times a day (BID) | ORAL | 0 refills | Status: AC

## 2020-11-26 MED ORDER — ALBUTEROL SULFATE HFA 108 (90 BASE) MCG/ACT IN AERS
2.0000 | INHALATION_SPRAY | Freq: Once | RESPIRATORY_TRACT | Status: AC
  Administered 2020-11-26: 2 via RESPIRATORY_TRACT
  Filled 2020-11-26: qty 6.7

## 2020-11-26 MED ORDER — PREDNISONE 10 MG PO TABS: 30.0000 mg | ORAL_TABLET | Freq: Every day | ORAL | 0 refills | Status: AC

## 2020-11-26 MED ORDER — ONDANSETRON HCL 4 MG/2ML IJ SOLN
4.0000 mg | Freq: Once | INTRAMUSCULAR | Status: AC
  Administered 2020-11-26: 4 mg via INTRAVENOUS
  Filled 2020-11-26: qty 2

## 2020-11-26 MED ORDER — METHYLPREDNISOLONE SODIUM SUCC 125 MG IJ SOLR
125.0000 mg | Freq: Once | INTRAMUSCULAR | Status: AC
  Administered 2020-11-26: 125 mg via INTRAVENOUS
  Filled 2020-11-26: qty 2

## 2020-11-26 MED ORDER — IPRATROPIUM-ALBUTEROL 0.5-2.5 (3) MG/3ML IN SOLN
3.0000 mL | Freq: Once | RESPIRATORY_TRACT | Status: AC
  Administered 2020-11-26: 3 mL via RESPIRATORY_TRACT
  Filled 2020-11-26: qty 3

## 2020-11-26 NOTE — Discharge Instructions (Addendum)
Lab work and imaging are all reassuring. Starting you On steroids please take as prescribed.  Also given you inhaler please use every 4 hours 1 to 2 puffs as needed for shortness of breath.  This medication can increase your heart rate.  I have also started you on antibiotics to cover you for possible atypical viral infection.  Please take as prescribed.  Please follow with your PCP as needed.  Come back to the emergency department if you develop chest pain, shortness of breath, severe abdominal pain, uncontrolled nausea, vomiting, diarrhea.

## 2020-11-26 NOTE — ED Notes (Signed)
Pt reports mild headache and nausea, provider made aware, new orders received

## 2020-11-26 NOTE — ED Provider Notes (Signed)
[facility] Provider Note   CSN: [id number] Arrival date & time: 11/26/20  1236     History Chief Complaint  Patient presents with   Chest Pain    Isabella Holmes is a 75 y.o. female.  HPI  Patient with significant medical history of asthma, seizure disorders, sleep apnea presents to the emergency department with chief complaint of chest tightness.  Patient states this started gradually yesterday, states she has been having coughing and wheezing fits, which makes her chest feel tight.  She denies sputum production, she denies associated fevers, chills, nasal congestion, sore throat, stomach pain, nausea, vomiting, general body aches.  She denies recent sick contacts, has only had 1 dose of her COVID-vaccine, she is not immunocompromise.  She has no significant cardiac history, no history of PEs or DVTs, currently not on hormone therapy, she denies worsening pedal edema or orthopnea.  Patient states that she has been taking her albuterol inhaler she only took 2 puffs of it yesterday without much relief, she states she is not taking at all today.  States that this feels like her normal asthma exacerbation but has not gone away.  Past Medical History:  Diagnosis Date   Arthritis    Asthma    Chest pain    w abnormal ECg   Chronic headaches    migraine   Dyslipidemia    Dyspnea    Glucose intolerance (impaired glucose tolerance)    Hemorrhoids 03/25/2015   Hypothyroidism    Seizure disorder (HCC)    on Lamictal; has not had seizure in 15 years   Seizures ([place])    Sleep apnea    lost weight and does not need CPAP any more.   Varicose veins 03/25/2015   Venous insufficiency     Patient Active Problem List   Diagnosis Date Noted   Arthrofibrosis of total knee replacement right [facility]) s/p Manipulation 04/05/17    S/P total knee replacement, right 02/13/17 02/27/2017   Primary osteoarthritis of one knee, right 02/13/2017   Encounter for screening colonoscopy  01/31/2017   Derangement of posterior horn of medial meniscus of right knee    Varicose veins 03/25/2015   Hemorrhoids 03/25/2015   Vulvar abscess 02/12/2015   OVERWEIGHT 01/06/2009   ANXIETY STATE, UNSPECIFIED 01/06/2009   HYPERTENSION 01/06/2009   ESOPHAGEAL REFLUX 01/06/2009   SEIZURE DISORDER 01/06/2009   HYPERSOMNIA WITH SLEEP APNEA UNSPECIFIED 01/06/2009   UNSPECIFIED SLEEP APNEA 01/06/2009   OTHER CHEST PAIN 01/06/2009    Past Surgical History:  Procedure Laterality Date   EXAM UNDER ANESTHESIA WITH MANIPULATION OF KNEE Right 04/05/2017   Procedure: EXAM UNDER ANESTHESIA WITH MANIPULATION OF RIGHT KNEE;  Surgeon: Carole Civil, MD;  Location: [facility];  Service: Orthopedics;  Laterality: Right;   KNEE ARTHROSCOPY Left    KNEE ARTHROSCOPY WITH MEDIAL MENISECTOMY Right 06/28/2016   Procedure: KNEE ARTHROSCOPY WITH MEDIAL MENISECTOMY;  Surgeon: Carole Civil, MD;  Location: [facility];  Service: Orthopedics;  Laterality: Right;   left vein stripping     status post bilateral tubaligation     TONSILLECTOMY     many years ago   TOTAL KNEE ARTHROPLASTY Right 02/13/2017   Procedure: TOTAL KNEE ARTHROPLASTY;  Surgeon: Carole Civil, MD;  Location: [facility];  Service: Orthopedics;  Laterality: Right;   TUBAL LIGATION       OB History     Gravida  2   Para  2   Term  Preterm      AB      Living  2      SAB      IAB      Ectopic      Multiple      Live Births              Family History  Problem Relation Age of Onset   Heart attack Mother    Tuberculosis Father    Heart disease Father        cardiac disease   Cirrhosis Father    Factor V Leiden deficiency Other    Seizures Son     Social History   Tobacco Use   Smoking status: Former    Packs/day: 2.50    Years: 20.00    Pack years: 50.00    Types: Cigarettes    Quit date: 06/23/1986    Years since quitting: 34.4   Smokeless tobacco: Never  Vaping Use   Vaping Use: Never used   Substance Use Topics   Alcohol use: No   Drug use: No    Home Medications Prior to Admission medications   Medication Sig Start Date End Date Taking? Authorizing Provider  albuterol (PROVENTIL HFA;VENTOLIN HFA) 108 (90 Base) MCG/ACT inhaler Inhale 1-2 puffs into the lungs every 4 (four) hours as needed for wheezing or shortness of breath.   Yes [provider]  aspirin EC 325 MG EC tablet Take 1 tablet (325 mg total) by mouth daily with breakfast. 02/16/17  Yes Carole Civil, MD  Cholecalciferol (VITAMIN D3) 25 MCG (1000 UT) CAPS Take 1 capsule by mouth daily.   Yes [provider]  doxycycline (VIBRAMYCIN) 100 MG capsule Take 1 capsule (100 mg total) by mouth 2 (two) times daily for 7 days. 11/26/20 12/03/20 Yes Marcello Fennel, PA-C  famotidine (PEPCID) 20 MG tablet Take 20 mg by mouth 2 (two) times daily. 11/22/20  Yes [provider]  FLUoxetine (PROZAC) 20 MG capsule Take 20 mg by mouth daily. 11/23/20  Yes [provider]  IBU 600 MG tablet Take 1 tablet (600 mg total) by mouth every 6 (six) hours as needed. 03/21/17  Yes Carole Civil, MD  lamoTRIgine (LAMICTAL) 150 MG tablet Take 150 mg by mouth at bedtime.   Yes [provider]  levothyroxine (SYNTHROID, LEVOTHROID) 50 MCG tablet Take 50 mcg by mouth daily before breakfast.  05/19/16  Yes [provider]  Omega-3 Fatty Acids (FISH OIL) 1000 MG CAPS Take 1 capsule by mouth daily.   Yes [provider]  predniSONE (DELTASONE) 10 MG tablet Take 3 tablets (30 mg total) by mouth daily for 5 days. 11/26/20 12/01/20 Yes Marcello Fennel, PA-C  cephALEXin (KEFLEX) 500 MG capsule Take 500 mg by mouth 3 (three) times daily. Patient not taking: No sig reported 10/13/20   [provider]  cetirizine (ZYRTEC) 10 MG tablet Take 10 mg by mouth as needed for allergies. Patient not taking: Reported on 11/26/2020    [provider]  docusate sodium (COLACE) 100 MG  capsule Take 1 capsule (100 mg total) by mouth 2 (two) times daily. Patient not taking: Reported on 11/26/2020 02/15/17   Carole Civil, MD  HYDROcodone-acetaminophen [facility]) 5-325 MG tablet Take 1 tablet by mouth every 6 (six) hours as needed for moderate pain. Patient not taking: No sig reported 05/01/17   Carole Civil, MD  lansoprazole (PREVACID) 15 MG capsule Take 15 mg by mouth daily at  12 noon. Patient not taking: No sig reported    [provider]  methocarbamol (ROBAXIN) 500 MG tablet Take 1 tablet (500 mg total) by mouth 4 (four) times daily. Patient not taking: No sig reported 04/05/17   Carole Civil, MD    Allergies    Levofloxacin, Penicillins, Streptomycin, and Sulfonamide derivatives  Review of Systems   Review of Systems  Constitutional:  Negative for chills and fever.  HENT:  Negative for congestion and trouble swallowing.   Respiratory:  Positive for cough and chest tightness. Negative for shortness of breath.   Cardiovascular:  Negative for chest pain.  Gastrointestinal:  Negative for abdominal pain, diarrhea, nausea and vomiting.  Genitourinary:  Negative for enuresis.  Musculoskeletal:  Negative for back pain and myalgias.  Skin:  Negative for rash.  Neurological:  Negative for dizziness and headaches.  Hematological:  Does not bruise/bleed easily.   Physical Exam Updated Vital Signs BP (!) 146/70   Pulse 68   Temp 98 F (36.7 C) (Oral)   Resp (!) 21   Ht '5\' 6"'$  (1.676 m)   Wt 118.6 kg   SpO2 95%   BMI 42.19 kg/m   Physical Exam Vitals and nursing note reviewed.  Constitutional:      General: She is not in acute distress.    Appearance: She is not ill-appearing.  HENT:     Head: Normocephalic and atraumatic.     Nose: No congestion.     Mouth/Throat:     Mouth: Mucous membranes are moist.  Eyes:     Conjunctiva/sclera: Conjunctivae normal.  Cardiovascular:     Rate and Rhythm: Normal rate and regular rhythm.     Pulses:  Normal pulses.     Heart sounds: No murmur heard.   No friction rub. No gallop.  Pulmonary:     Effort: No respiratory distress.     Breath sounds: Wheezing present. No rhonchi or rales.     Comments: Patient is nonrespiratory distress, nontachypneic, nonhypoxic, on room air, she has a tight sounding chest, intermittent wheezing heard in the left upper lobes no other acute abnormalities present. Chest:     Chest wall: Tenderness present.  Abdominal:     Palpations: Abdomen is soft.     Tenderness: There is no abdominal tenderness. There is no right CVA tenderness or left CVA tenderness.  Musculoskeletal:     Right lower leg: No edema.     Left lower leg: No edema.  Skin:    General: Skin is warm and dry.  Neurological:     Mental Status: She is alert.  Psychiatric:        Mood and Affect: Mood normal.    ED Results / Procedures / Treatments   Labs (all labs ordered are listed, but only abnormal results are displayed) Labs Reviewed  CBC - Abnormal; Notable for the following components:      Result Value   WBC 3.4 (*)    All other components within normal limits  RESP PANEL BY RT-PCR (FLU A&B, COVID) ARPGX2  BASIC METABOLIC PANEL  TROPONIN I (HIGH SENSITIVITY)  TROPONIN I (HIGH SENSITIVITY)    EKG EKG Interpretation  Date/Time:  Friday November 26 2020 12:46:50 EDT Ventricular Rate:  58 PR Interval:  170 QRS Duration: 96 QT Interval:  396 QTC Calculation: 389 R Axis:   -8 Text Interpretation: Sinus rhythm Low voltage, precordial leads Borderline T abnormalities, anterior leads Confirmed by Milton Ferguson [phone]) on 11/26/2020 2:29:27 PM  Radiology DG Chest 2 View  Result Date: 11/26/2020 CLINICAL DATA:  Chest pain, shortness of breath EXAM: CHEST - 2 VIEW COMPARISON:  Chest radiograph 02/17/2020 FINDINGS: The cardiomediastinal silhouette is normal. Minimal linear opacities in the left base may reflect atelectasis or scar. There is no focal consolidation or pulmonary  edema. There is no pleural effusion or pneumothorax. There is mild multilevel degenerative change of the imaged spine. There is no acute osseous abnormality. IMPRESSION: No radiographic evidence of acute cardiopulmonary process. Electronically Signed   By: Valetta Mole M.D.   On: 11/26/2020 13:55    Procedures Procedures   Medications Ordered in ED Medications  albuterol (VENTOLIN HFA) 108 (90 Base) MCG/ACT inhaler 2 puff (2 puffs Inhalation Given 11/26/20 1510)  methylPREDNISolone sodium succinate (SOLU-MEDROL) 125 mg/2 mL injection 125 mg (125 mg Intravenous Given 11/26/20 1509)  ondansetron (ZOFRAN) injection 4 mg (4 mg Intravenous Given 11/26/20 1526)  ketorolac (TORADOL) 30 MG/ML injection 15 mg (15 mg Intravenous Given 11/26/20 1528)  ipratropium-albuterol (DUONEB) 0.5-2.5 (3) MG/3ML nebulizer solution 3 mL (3 mLs Nebulization Given 11/26/20 1631)    ED Course  I have reviewed the triage vital signs and the nursing notes.  Pertinent labs & imaging results that were available during my care of the patient were reviewed by me and considered in my medical decision making (see chart for details).    MDM Rules/Calculators/A&P                          Initial impression-patient presents with chest tightness and a cough.  She is alert, does not appear in distress, vital signs reassuring.  Suspect asthma exacerbation, will provide patient with albuterol obtain basic lab work-up and reassess.  Work-up-CBC unremarkable, BMP unremarkable, negative delta troponin, respiratory panel negative chest x-ray negative for acute findings.  EKG sinus without signs of ischemia.  Reassessment-patient was reassessed after albuterol, she states she is feeling better, lung sounds have improved minimally.  will continue to monitor.  Patient's respiratory panel is negative  will provide patient with a dual neb and reassess.  Patient's lung sounds have improved immensely, no longer sounds tight sounding,  intermittent wheezing present, patient states she is feeling much better, patient agreed for discharge.  Rule out- I have low suspicion for ACS as history is atypical, patient has no cardiac history, EKG was sinus rhythm without signs of ischemia, patient had a negative delta troponin.  Low suspicion for PE as patient denies pleuritic chest pain, shortness of breath, patient denies leg pain, no pedal edema noted on exam, vital signs reassuring nontachycardic, nontachypneic, nonhypoxic.  Low suspicion for AAA or aortic dissection as history is atypical, patient has low risk factors.  Low suspicion for systemic infection as patient is nontoxic-appearing, vital signs reassuring, no obvious source infection noted on exam. .Low suspicion for systemic infection as patient is nontoxic-appearing, vital signs reassuring, no obvious source infection noted on exam.  Low suspicion for pneumonia as lung sounds are clear bilaterally, x-ray did not reveal any acute findings.   low suspicion for strep throat as oropharynx was visualized, no erythema or exudates noted.  Low suspicion patient would need  hospitalized due to viral infection or Covid as vital signs reassuring, patient is not in respiratory distress.     Plan-  Shortness of breath since resolved-unclear etiology,  concern for possible atypical infection as she has leukocytopenia, will start her on doxycycline, start her on steroids, have her follow-up with  PCP as needed.  Vital signs have remained stable, no indication for hospital admission.  Patient given at home care as well strict return precautions.  Patient verbalized that they understood agreed to said plan.  Final Clinical Impression(s) / ED Diagnoses Final diagnoses:  SOB (shortness of breath)    Rx / DC Orders ED Discharge Orders          Ordered    doxycycline (VIBRAMYCIN) 100 MG capsule  2 times daily        11/26/20 1711    predniSONE (DELTASONE) 10 MG tablet  Daily        11/26/20  1711             Aron Baba 11/26/20 1713    Milton Ferguson, MD 11/29/20 1640

## 2020-11-26 NOTE — ED Notes (Signed)
Pt provided discharge instructions and prescription information. Pt was given the opportunity to ask questions and questions were answered. Discharge signature not obtained in the setting of the COVID-19 pandemic in order to reduce high touch surfaces.  ° °

## 2020-11-26 NOTE — ED Triage Notes (Signed)
Pt c/o chest tightness that started yesterday while doing house work. Pt states it becomes worse when she starts wheezing and coughing. Pt states she also has some tightness in the back of her neck. Pt denies nausea or diaphoresis. Pt has tried albuterol inhalers without relief.

## 2020-11-26 NOTE — ED Notes (Signed)
Patient transported to X-ray 

## 2020-12-17 DIAGNOSIS — E039 Hypothyroidism, unspecified: Secondary | ICD-10-CM | POA: Diagnosis not present

## 2020-12-17 DIAGNOSIS — R079 Chest pain, unspecified: Secondary | ICD-10-CM | POA: Diagnosis not present

## 2020-12-17 DIAGNOSIS — I110 Hypertensive heart disease with heart failure: Secondary | ICD-10-CM | POA: Diagnosis not present

## 2020-12-17 DIAGNOSIS — Z23 Encounter for immunization: Secondary | ICD-10-CM | POA: Diagnosis not present

## 2020-12-17 DIAGNOSIS — R9431 Abnormal electrocardiogram [ECG] [EKG]: Secondary | ICD-10-CM | POA: Diagnosis not present

## 2020-12-17 DIAGNOSIS — F32A Depression, unspecified: Secondary | ICD-10-CM | POA: Diagnosis not present

## 2020-12-17 DIAGNOSIS — I088 Other rheumatic multiple valve diseases: Secondary | ICD-10-CM | POA: Diagnosis not present

## 2020-12-17 DIAGNOSIS — R0600 Dyspnea, unspecified: Secondary | ICD-10-CM | POA: Diagnosis not present

## 2020-12-17 DIAGNOSIS — E010 Iodine-deficiency related diffuse (endemic) goiter: Secondary | ICD-10-CM | POA: Diagnosis not present

## 2020-12-17 DIAGNOSIS — R569 Unspecified convulsions: Secondary | ICD-10-CM | POA: Diagnosis not present

## 2020-12-17 DIAGNOSIS — R0789 Other chest pain: Secondary | ICD-10-CM | POA: Diagnosis not present

## 2020-12-17 DIAGNOSIS — Z87891 Personal history of nicotine dependence: Secondary | ICD-10-CM | POA: Diagnosis not present

## 2020-12-17 DIAGNOSIS — I10 Essential (primary) hypertension: Secondary | ICD-10-CM | POA: Diagnosis not present

## 2020-12-17 DIAGNOSIS — I5031 Acute diastolic (congestive) heart failure: Secondary | ICD-10-CM | POA: Diagnosis not present

## 2020-12-17 DIAGNOSIS — J439 Emphysema, unspecified: Secondary | ICD-10-CM | POA: Diagnosis not present

## 2020-12-17 DIAGNOSIS — R0602 Shortness of breath: Secondary | ICD-10-CM | POA: Diagnosis not present

## 2020-12-17 DIAGNOSIS — Z79899 Other long term (current) drug therapy: Secondary | ICD-10-CM | POA: Diagnosis not present

## 2020-12-17 DIAGNOSIS — J811 Chronic pulmonary edema: Secondary | ICD-10-CM | POA: Diagnosis not present

## 2020-12-17 DIAGNOSIS — G4733 Obstructive sleep apnea (adult) (pediatric): Secondary | ICD-10-CM | POA: Diagnosis not present

## 2020-12-17 DIAGNOSIS — K219 Gastro-esophageal reflux disease without esophagitis: Secondary | ICD-10-CM | POA: Diagnosis not present

## 2020-12-17 LAB — TSH: TSH: 1.01 (ref 0.41–5.90)

## 2020-12-18 DIAGNOSIS — Z87891 Personal history of nicotine dependence: Secondary | ICD-10-CM | POA: Diagnosis not present

## 2020-12-18 DIAGNOSIS — I509 Heart failure, unspecified: Secondary | ICD-10-CM | POA: Diagnosis not present

## 2020-12-18 DIAGNOSIS — I110 Hypertensive heart disease with heart failure: Secondary | ICD-10-CM | POA: Diagnosis not present

## 2020-12-18 DIAGNOSIS — J449 Chronic obstructive pulmonary disease, unspecified: Secondary | ICD-10-CM | POA: Diagnosis not present

## 2020-12-18 DIAGNOSIS — G4733 Obstructive sleep apnea (adult) (pediatric): Secondary | ICD-10-CM | POA: Diagnosis not present

## 2020-12-18 DIAGNOSIS — K219 Gastro-esophageal reflux disease without esophagitis: Secondary | ICD-10-CM | POA: Diagnosis not present

## 2020-12-18 DIAGNOSIS — E039 Hypothyroidism, unspecified: Secondary | ICD-10-CM | POA: Diagnosis not present

## 2020-12-19 DIAGNOSIS — E039 Hypothyroidism, unspecified: Secondary | ICD-10-CM | POA: Diagnosis not present

## 2020-12-19 DIAGNOSIS — R079 Chest pain, unspecified: Secondary | ICD-10-CM | POA: Diagnosis not present

## 2020-12-19 DIAGNOSIS — R0602 Shortness of breath: Secondary | ICD-10-CM | POA: Diagnosis not present

## 2020-12-19 DIAGNOSIS — J449 Chronic obstructive pulmonary disease, unspecified: Secondary | ICD-10-CM | POA: Diagnosis not present

## 2020-12-19 DIAGNOSIS — Z87891 Personal history of nicotine dependence: Secondary | ICD-10-CM | POA: Diagnosis not present

## 2020-12-19 DIAGNOSIS — K219 Gastro-esophageal reflux disease without esophagitis: Secondary | ICD-10-CM | POA: Diagnosis not present

## 2020-12-19 DIAGNOSIS — I509 Heart failure, unspecified: Secondary | ICD-10-CM | POA: Diagnosis not present

## 2020-12-19 DIAGNOSIS — G4733 Obstructive sleep apnea (adult) (pediatric): Secondary | ICD-10-CM | POA: Diagnosis not present

## 2020-12-20 DIAGNOSIS — L821 Other seborrheic keratosis: Secondary | ICD-10-CM | POA: Diagnosis not present

## 2020-12-20 DIAGNOSIS — D485 Neoplasm of uncertain behavior of skin: Secondary | ICD-10-CM | POA: Diagnosis not present

## 2020-12-24 DIAGNOSIS — R0600 Dyspnea, unspecified: Secondary | ICD-10-CM | POA: Diagnosis not present

## 2020-12-27 ENCOUNTER — Telehealth: Payer: Self-pay

## 2020-12-27 NOTE — Telephone Encounter (Signed)
Received urgent referral on pt from [facility] in regards to a thyroid  nodule found at [facility]. I called and spoke with the referral dept at [place], she stated they requested the records on 10/14. I advised her we can not schedule the new pt appointment without the records. She stated she will send to [place] as soon as she receives them

## 2021-01-02 ENCOUNTER — Encounter (HOSPITAL_COMMUNITY): Payer: Self-pay | Admitting: Emergency Medicine

## 2021-01-02 ENCOUNTER — Emergency Department (HOSPITAL_COMMUNITY)
Admission: EM | Admit: 2021-01-02 | Discharge: 2021-01-02 | Disposition: A | Discharge status: Home / Self Care | Payer: Medicare Other | Attending: Emergency Medicine | Admitting: Emergency Medicine

## 2021-01-02 ENCOUNTER — Emergency Department (HOSPITAL_COMMUNITY): Payer: Medicare Other

## 2021-01-02 ENCOUNTER — Other Ambulatory Visit: Payer: Self-pay

## 2021-01-02 DIAGNOSIS — Z7982 Long term (current) use of aspirin: Secondary | ICD-10-CM | POA: Insufficient documentation

## 2021-01-02 DIAGNOSIS — Z96651 Presence of right artificial knee joint: Secondary | ICD-10-CM | POA: Diagnosis not present

## 2021-01-02 DIAGNOSIS — Z87891 Personal history of nicotine dependence: Secondary | ICD-10-CM | POA: Insufficient documentation

## 2021-01-02 DIAGNOSIS — M6281 Muscle weakness (generalized): Secondary | ICD-10-CM | POA: Insufficient documentation

## 2021-01-02 DIAGNOSIS — J45909 Unspecified asthma, uncomplicated: Secondary | ICD-10-CM | POA: Insufficient documentation

## 2021-01-02 DIAGNOSIS — E039 Hypothyroidism, unspecified: Secondary | ICD-10-CM | POA: Diagnosis not present

## 2021-01-02 DIAGNOSIS — R0600 Dyspnea, unspecified: Secondary | ICD-10-CM

## 2021-01-02 DIAGNOSIS — I509 Heart failure, unspecified: Secondary | ICD-10-CM | POA: Diagnosis not present

## 2021-01-02 DIAGNOSIS — R001 Bradycardia, unspecified: Secondary | ICD-10-CM | POA: Insufficient documentation

## 2021-01-02 DIAGNOSIS — J449 Chronic obstructive pulmonary disease, unspecified: Secondary | ICD-10-CM | POA: Diagnosis not present

## 2021-01-02 DIAGNOSIS — Z79899 Other long term (current) drug therapy: Secondary | ICD-10-CM | POA: Diagnosis not present

## 2021-01-02 DIAGNOSIS — R0602 Shortness of breath: Secondary | ICD-10-CM | POA: Insufficient documentation

## 2021-01-02 HISTORY — DX: Heart failure, unspecified: I50.9

## 2021-01-02 HISTORY — DX: Chronic obstructive pulmonary disease, unspecified: J44.9

## 2021-01-02 LAB — BASIC METABOLIC PANEL
Anion gap: 7 (ref 5–15)
BUN: 12 mg/dL (ref 8–23)
CO2: 30 mmol/L (ref 22–32)
Calcium: 9.8 mg/dL (ref 8.9–10.3)
Chloride: 101 mmol/L (ref 98–111)
Creatinine, Ser: 0.92 mg/dL (ref 0.44–1.00)
GFR, Estimated: >60 mL/min (ref >60)
Glucose, Bld: 100 mg/dL — ABNORMAL HIGH (ref 70–99)
Potassium: 4.2 mmol/L (ref 3.5–5.1)
Sodium: 138 mmol/L (ref 135–145)

## 2021-01-02 LAB — CBC
HCT: 42.3 % (ref 36.0–46.0)
Hemoglobin: 13.4 g/dL (ref 12.0–15.0)
MCH: 29.3 pg (ref 26.0–34.0)
MCHC: 31.7 g/dL (ref 30.0–36.0)
MCV: 92.6 fL (ref 80.0–100.0)
Platelets: 232 10*3/uL (ref 150–400)
RBC: 4.57 MIL/uL (ref 3.87–5.11)
RDW: 12.8 % (ref 11.5–15.5)
WBC: 4.4 10*3/uL (ref 4.0–10.5)
nRBC: 0 % (ref 0.0–0.2)

## 2021-01-02 LAB — TROPONIN I (HIGH SENSITIVITY)
Troponin I (High Sensitivity): 5 ng/L (ref <18)
Troponin I (High Sensitivity): 5 ng/L (ref <18)

## 2021-01-02 LAB — BRAIN NATRIURETIC PEPTIDE: B Natriuretic Peptide: 62.5 pg/mL (ref 0.0–100.0)

## 2021-01-02 NOTE — ED Triage Notes (Signed)
Pt admitted to [facility] 2 weeks ago for CHF.  States she is taking Lasix but was supposed to be weighing and doesn't have scales.  She is taking it based on how SOB she is.  Reports generalized weakness to lower extremities since this morning and increased SOB with exertion since being discharged from [facility].

## 2021-01-02 NOTE — ED Notes (Addendum)
Pt ambulated to the bathroom with some assistance. SpO2 remained between 93-95% and HR between 69-72.

## 2021-01-02 NOTE — ED Provider Notes (Signed)
[place] EMERGENCY DEPARTMENT Provider Note  CSN: [id number] Arrival date & time: 01/02/21 1154    History Chief Complaint  Patient presents with   Shortness of Isabella Holmes is a 75 y.o. female with prior history of asthma/COPD has had exertional SOB off and on for the last several weeks. She reports she was admitted at [facility] 2 weeks ago for three days and diagnosed with CHF (based on a CT scan). She was told she did not have asthma and 'only a touch of COPD'. She was started on Lasix and told to take it 'as needed' and recommended for outpatient cardiology follow up which is scheduled in 3 days. She reports she continues to feel SOB when walking around. She does not have chest pain but feels her breathing is tight. Symptoms are not improved with Lasix but are improved with her inhalers. She reports she sometimes feels weak in her legs when walking. Denies any new leg swelling.    Past Medical History:  Diagnosis Date   Arthritis    Asthma    Chest pain    w abnormal ECg   CHF (congestive heart failure) (HCC)    Chronic headaches    migraine   COPD (chronic obstructive pulmonary disease) (HCC)    Dyslipidemia    Dyspnea    Glucose intolerance (impaired glucose tolerance)    Hemorrhoids 03/25/2015   Hypothyroidism    Seizure disorder ([place])    on Lamictal; has not had seizure in 15 years   Seizures ([place])    Sleep apnea    lost weight and does not need CPAP any more.   Varicose veins 03/25/2015   Venous insufficiency     Past Surgical History:  Procedure Laterality Date   EXAM UNDER ANESTHESIA WITH MANIPULATION OF KNEE Right 04/05/2017   Procedure: EXAM UNDER ANESTHESIA WITH MANIPULATION OF RIGHT KNEE;  Surgeon: Carole Civil, MD;  Location: [facility];  Service: Orthopedics;  Laterality: Right;   KNEE ARTHROSCOPY Left    KNEE ARTHROSCOPY WITH MEDIAL MENISECTOMY Right 06/28/2016   Procedure: KNEE ARTHROSCOPY WITH MEDIAL MENISECTOMY;  Surgeon: Carole Civil, MD;  Location: [facility];  Service: Orthopedics;  Laterality: Right;   left vein stripping     status post bilateral tubaligation     TONSILLECTOMY     many years ago   TOTAL KNEE ARTHROPLASTY Right 02/13/2017   Procedure: TOTAL KNEE ARTHROPLASTY;  Surgeon: Carole Civil, MD;  Location: [facility];  Service: Orthopedics;  Laterality: Right;   TUBAL LIGATION      Family History  Problem Relation Age of Onset   Heart attack Mother    Tuberculosis Father    Heart disease Father        cardiac disease   Cirrhosis Father    Factor V Leiden deficiency Other    Seizures Son     Social History   Tobacco Use   Smoking status: Former    Packs/day: 2.50    Years: 20.00    Pack years: 50.00    Types: Cigarettes    Quit date: 06/23/1986    Years since quitting: 34.5   Smokeless tobacco: Never  Vaping Use   Vaping Use: Never used  Substance Use Topics   Alcohol use: No   Drug use: No     Home Medications Prior to Admission medications   Medication Sig Start Date End Date Taking? Authorizing Provider  albuterol (PROVENTIL HFA;VENTOLIN HFA) 108 (90  Base) MCG/ACT inhaler Inhale 1-2 puffs into the lungs every 4 (four) hours as needed for wheezing or shortness of breath.    [provider]  aspirin EC 325 MG EC tablet Take 1 tablet (325 mg total) by mouth daily with breakfast. 02/16/17   Carole Civil, MD  cephALEXin (KEFLEX) 500 MG capsule Take 500 mg by mouth 3 (three) times daily. Patient not taking: No sig reported 10/13/20   [provider]  cetirizine (ZYRTEC) 10 MG tablet Take 10 mg by mouth as needed for allergies. Patient not taking: Reported on 11/26/2020    [provider]  Cholecalciferol (VITAMIN D3) 25 MCG (1000 UT) CAPS Take 1 capsule by mouth daily.    [provider]  docusate sodium (COLACE) 100 MG capsule Take 1 capsule (100 mg total) by mouth 2 (two) times daily. Patient not taking: Reported on 11/26/2020 02/15/17    Carole Civil, MD  famotidine (PEPCID) 20 MG tablet Take 20 mg by mouth 2 (two) times daily. 11/22/20   [provider]  FLUoxetine (PROZAC) 20 MG capsule Take 20 mg by mouth daily. 11/23/20   [provider]  HYDROcodone-acetaminophen (NORCO) 5-325 MG tablet Take 1 tablet by mouth every 6 (six) hours as needed for moderate pain. Patient not taking: No sig reported 05/01/17   Carole Civil, MD  IBU 600 MG tablet Take 1 tablet (600 mg total) by mouth every 6 (six) hours as needed. 03/21/17   Carole Civil, MD  lamoTRIgine (LAMICTAL) 150 MG tablet Take 150 mg by mouth at bedtime.    [provider]  lansoprazole (PREVACID) 15 MG capsule Take 15 mg by mouth daily at 12 noon. Patient not taking: No sig reported    [provider]  levothyroxine (SYNTHROID, LEVOTHROID) 50 MCG tablet Take 50 mcg by mouth daily before breakfast.  05/19/16   [provider]  methocarbamol (ROBAXIN) 500 MG tablet Take 1 tablet (500 mg total) by mouth 4 (four) times daily. Patient not taking: No sig reported 04/05/17   Carole Civil, MD  Omega-3 Fatty Acids (FISH OIL) 1000 MG CAPS Take 1 capsule by mouth daily.    [provider]     Allergies    Levofloxacin, Penicillins, Streptomycin, and Sulfonamide derivatives   Review of Systems   Review of Systems A comprehensive review of systems was completed and negative except as noted in HPI.    Physical Exam BP (!) 180/71   Pulse (!) 52   Temp 97.7 F (36.5 C) (Oral)   Resp 15   SpO2 98%   Physical Exam Vitals and nursing note reviewed.  Constitutional:      Appearance: Normal appearance.  HENT:     Head: Normocephalic and atraumatic.     Nose: Nose normal.     Mouth/Throat:     Mouth: Mucous membranes are moist.  Eyes:     Extraocular Movements: Extraocular movements intact.     Conjunctiva/sclera: Conjunctivae normal.  Cardiovascular:     Rate and Rhythm: Normal rate.  Pulmonary:      Effort: Pulmonary effort is normal.     Breath sounds: Normal breath sounds. No wheezing, rhonchi or rales.  Abdominal:     General: Abdomen is flat.     Palpations: Abdomen is soft.     Tenderness: There is no abdominal tenderness.  Musculoskeletal:        General: No swelling. Normal range of motion.     Cervical back:  Neck supple.     Right lower leg: No edema.  Skin:    General: Skin is warm and dry.  Neurological:     General: No focal deficit present.     Mental Status: She is alert.  Psychiatric:        Mood and Affect: Mood normal.     ED Results / Procedures / Treatments   Labs (all labs ordered are listed, but only abnormal results are displayed) Labs Reviewed  BASIC METABOLIC PANEL - Abnormal; Notable for the following components:      Result Value   Glucose, Bld 100 (*)    All other components within normal limits  CBC  BRAIN NATRIURETIC PEPTIDE  URINALYSIS, ROUTINE W REFLEX MICROSCOPIC  TROPONIN I (HIGH SENSITIVITY)  TROPONIN I (HIGH SENSITIVITY)    EKG EKG Interpretation  Date/Time:  Sunday January 02 2021 12:38:57 EDT Ventricular Rate:  58 PR Interval:  182 QRS Duration: 82 QT Interval:  448 QTC Calculation: 439 R Axis:   -23 Text Interpretation: Sinus bradycardia Otherwise normal ECG No significant change since last tracing Confirmed by Calvert Cantor [id number]) on 01/02/2021 4:12:24 PM  Radiology DG Chest 2 View  Result Date: 01/02/2021 CLINICAL DATA:  Shortness of breath. History of congestive heart failure with recent hospitalization. EXAM: CHEST - 2 VIEW COMPARISON:  Radiographs 12/17/2020 and 11/26/2020. FINDINGS: The heart size and mediastinal contours are stable. Stable mild left basilar scarring and mild central airway thickening. No edema, confluent airspace opacity, pleural effusion or pneumothorax. There are mild degenerative changes in the spine without evidence of acute osseous abnormality. IMPRESSION: Stable chest.  No active  cardiopulmonary process. Electronically Signed   By: Richardean Sale M.D.   On: 01/02/2021 13:24    Procedures Procedures  Medications Ordered in the ED Medications - No data to display   MDM Rules/Calculators/A&P MDM Patient reports she had an admission to [facility] recently but records are not available in [place]. Daughter states she was registered under the wrong birthdate. Will call to see if those visits can be linked to her account here. I am unclear why she had a diagnosis of CHF based on a CT. She denies having had an echo. She is well appearing, no objective signs of dyspnea. She also reports she was told she does not have asthma and only a touch of COPD, but she has also not had PFTs done. Labs done in triage are normal, including CBC, BMP, Trop and BNP. CXR is clear.   ED Course  I have reviewed the triage vital signs and the nursing notes.  Pertinent labs & imaging results that were available during my care of the patient were reviewed by me and considered in my medical decision making (see chart for details).  Clinical Course as of 01/02/21 1813  Sun Jan 02, 2021  1625 Reviewed [facility] discharge summary. She had a normal Echo while she was there. The diagnosis of CHF was based off a CTA which was neg for PE but also read as mild cardiomegaly with possible mild pulm edema.  [CS]  1809 Patient remains asymptomatic in no distress. She is able to ambulate without hypoxia or tachycardia. She has a cardiology appointment already scheduled and I will also refer her to pulm for evaluation and consideration of pulmonary function testing as she has multiple potential causes of dyspnea that will need evaluation. PCP follow up to coordinate long term management.  [CS]    Clinical Course User Index [CS] Truddie Hidden,  MD    Final Clinical Impression(s) / ED Diagnoses Final diagnoses:  Dyspnea, unspecified type    Rx / DC Orders ED Discharge Orders     None        Truddie Hidden, MD 01/02/21 [phone]

## 2021-01-02 NOTE — ED Notes (Signed)
Pt not yet in room.

## 2021-01-02 NOTE — ED Provider Notes (Addendum)
Emergency Medicine Provider Triage Evaluation Note  Isabella Holmes , a 75 y.o. female  was evaluated in triage.  Pt complains of over 2 weeks of shortness of breath and intermittent chest pain radiating to her back.  Patient denies any orthopnea, but mentions she has the pain worse whenever she lays down on her side.  Patient was recently diagnosed with CHF by [facility] over 2 weeks ago.  Patient has follow-up with cardiology on Wednesday.  For the past 2 days, she reports worsening shortness of breath, bilateral leg weakness, and nausea.  Denies any abdominal pain, vomiting, diarrhea, constipation.  Patient does not have a scale at home and has not been taking her weights.  She was given Lasix at [facility] and has been taking it as needed depending on her shortness of breath level.  Denies any history of MIs or blood clots.   Review of Systems  Positive: Shortness of breath, chest pain, Negative: Abdominal pain, vomiting, diaphoresis  Physical Exam  BP (!) 178/76 (BP Location: Right Arm)   Pulse (!) 59   Temp 97.7 F (36.5 C) (Oral)   Resp (!) 22   SpO2 98%  Gen:   Awake, no distress   Resp:  Mild labored breathing, patient satting 98% on room air.  No respiratory distress or tripoding. MSK:   Moves extremities without difficulty, DP pulses intact.  Sensation intact.  Multiple varicosities. Other:  Equal strength in bilateral upper and lower extremities.  No pitting edema noted.  Medical Decision Making  Medically screening exam initiated at 12:38 PM.  Appropriate orders placed.  Isabella Holmes was informed that the remainder of the evaluation will be completed by another provider, this initial triage assessment does not replace that evaluation, and the importance of remaining in the ED until their evaluation is complete.  Labs ordered   Sherrell Puller, PA-C 01/02/21 1242    Sherrell Puller, PA-C 01/02/21 1243    Isla Pence, MD 01/02/21 1420

## 2021-01-05 ENCOUNTER — Ambulatory Visit: Payer: Medicare Other | Admitting: Cardiology

## 2021-01-14 ENCOUNTER — Encounter: Payer: Self-pay | Admitting: Pulmonary Disease

## 2021-01-14 ENCOUNTER — Other Ambulatory Visit: Payer: Self-pay

## 2021-01-14 ENCOUNTER — Ambulatory Visit: Payer: Medicare Other | Admitting: Pulmonary Disease

## 2021-01-14 VITALS — BP 122/72 | HR 70 | Temp 98.2°F | Ht 66.0 in | Wt 258.2 lb

## 2021-01-14 DIAGNOSIS — R0609 Other forms of dyspnea: Secondary | ICD-10-CM

## 2021-01-14 MED ORDER — TRELEGY ELLIPTA 200-62.5-25 MCG/ACT IN AEPB: 1.0000 | INHALATION_SPRAY | Freq: Every day | RESPIRATORY_TRACT | 0 refills | Status: DC

## 2021-01-14 NOTE — Progress Notes (Addendum)
Isabella Holmes    [id number]    06-02-1945  Primary Care Physician:Skillman, Helane Rima, PA-C  Referring Physician: Royann Shivers, PA-C [address] [place],  [place] [address]  Chief complaint: Consult for dyspnea, wheezing   HPI: 75 year old with history of asthma, COPD, sleep apnea  Complains of worsening dyspnea, wheezing for the past 2 months.  She was given antibiotics and prednisone as an outpatient which did not help  Admitted to [facility] in early October 2022 for shortness of breath.  Diagnosed with CHF, CT and examination showed mild bilateral opacities, negative PE.  She had elevated BNP.  Started on Lasix and recommended outpatient cardiology follow-up. Echocardiogram showed normal LV function  She was evaluated in [facility] ED on October 23 with persistent shortness of breath.  This time it is felt to be vesicular secondary to her asthma and COPD and referred to pulmonary clinic  She has history of childhood asthma and was told she has COPD but no PFTs on record Is just on albuterol as needed.  She is on GERD and is using Pepcid twice daily with good control of symptoms  Had COVID twice in March 2020 and January 2022.  Did not require hospitalizations and recovered well from both episodes  Pets: No pets Occupation: Used to work in [employer], Lawyer.  Currently retired Exposures: No mold, hot tub, Jacuzzi.  No feather pillows or comforter Smoking history: 50-pack-year smoker.  Quit in 1982 Travel history: No significant travel history Relevant family history: No family history of lung disease  Outpatient Encounter Medications as of 01/14/2021  Medication Sig   albuterol (PROVENTIL HFA;VENTOLIN HFA) 108 (90 Base) MCG/ACT inhaler Inhale 1-2 puffs into the lungs every 4 (four) hours as needed for wheezing or shortness of breath.   aspirin EC 325 MG EC tablet Take 1 tablet (325 mg total) by mouth daily with breakfast.   cephALEXin (KEFLEX) 500 MG  capsule Take 500 mg by mouth 3 (three) times daily.   cetirizine (ZYRTEC) 10 MG tablet Take 10 mg by mouth as needed for allergies.   Cholecalciferol (VITAMIN D3) 25 MCG (1000 UT) CAPS Take 1 capsule by mouth daily.   docusate sodium (COLACE) 100 MG capsule Take 1 capsule (100 mg total) by mouth 2 (two) times daily.   famotidine (PEPCID) 20 MG tablet Take 20 mg by mouth 2 (two) times daily.   FLUoxetine (PROZAC) 20 MG capsule Take 20 mg by mouth daily.   HYDROcodone-acetaminophen (NORCO) 5-325 MG tablet Take 1 tablet by mouth every 6 (six) hours as needed for moderate pain.   IBU 600 MG tablet Take 1 tablet (600 mg total) by mouth every 6 (six) hours as needed.   lamoTRIgine (LAMICTAL) 150 MG tablet Take 150 mg by mouth at bedtime.   lansoprazole (PREVACID) 15 MG capsule Take 15 mg by mouth daily at 12 noon.   levothyroxine (SYNTHROID, LEVOTHROID) 50 MCG tablet Take 50 mcg by mouth daily before breakfast.    methocarbamol (ROBAXIN) 500 MG tablet Take 1 tablet (500 mg total) by mouth 4 (four) times daily.   Omega-3 Fatty Acids (FISH OIL) 1000 MG CAPS Take 1 capsule by mouth daily.   No facility-administered encounter medications on file as of 01/14/2021.    Allergies as of 01/14/2021 - Review Complete 01/14/2021  Allergen Reaction Noted   Levofloxacin Other (See Comments) 01/30/2017   Penicillins     Streptomycin     Sulfonamide  derivatives      Past Medical History:  Diagnosis Date   Arthritis    Asthma    Chest pain    w abnormal ECg   CHF (congestive heart failure) (HCC)    Chronic headaches    migraine   COPD (chronic obstructive pulmonary disease) (HCC)    Dyslipidemia    Dyspnea    Dyspnea    Glucose intolerance (impaired glucose tolerance)    Hemorrhoids 03/25/2015   Hypothyroidism    Seizure disorder (HCC)    on Lamictal; has not had seizure in 15 years   Seizures (HCC)    Sleep apnea    lost weight and does not need CPAP any more.   Varicose veins 03/25/2015    Venous insufficiency     Past Surgical History:  Procedure Laterality Date   EXAM UNDER ANESTHESIA WITH MANIPULATION OF KNEE Right 04/05/2017   Procedure: EXAM UNDER ANESTHESIA WITH MANIPULATION OF RIGHT KNEE;  Surgeon: Vickki Hearing, MD;  Location: [facility];  Service: Orthopedics;  Laterality: Right;   KNEE ARTHROSCOPY Left    KNEE ARTHROSCOPY WITH MEDIAL MENISECTOMY Right 06/28/2016   Procedure: KNEE ARTHROSCOPY WITH MEDIAL MENISECTOMY;  Surgeon: Vickki Hearing, MD;  Location: [facility];  Service: Orthopedics;  Laterality: Right;   left vein stripping     status post bilateral tubaligation     TONSILLECTOMY     many years ago   TOTAL KNEE ARTHROPLASTY Right 02/13/2017   Procedure: TOTAL KNEE ARTHROPLASTY;  Surgeon: Vickki Hearing, MD;  Location: [facility];  Service: Orthopedics;  Laterality: Right;   TUBAL LIGATION      Family History  Problem Relation Age of Onset   Heart attack Mother    Tuberculosis Father    Heart disease Father        cardiac disease   Cirrhosis Father    Factor V Leiden deficiency Other    Seizures Son     Social History   Socioeconomic History   Marital status: Married    Spouse name: Not on file   Number of children: Not on file   Years of education: Not on file   Highest education level: Not on file  Occupational History   Not on file  Tobacco Use   Smoking status: Former    Packs/day: 2.50    Years: 20.00    Pack years: 50.00    Types: Cigarettes    Quit date: 06/23/1986    Years since quitting: 34.5   Smokeless tobacco: Never  Vaping Use   Vaping Use: Never used  Substance and Sexual Activity   Alcohol use: No   Drug use: No   Sexual activity: Yes    Birth control/protection: Post-menopausal  Other Topics Concern   Not on file  Social History Narrative   Full Time, works at [employer].    Social Determinants of Health   Financial Resource Strain: Not on file  Food Insecurity: Not on file  Transportation Needs: Not on file   Physical Activity: Not on file  Stress: Not on file  Social Connections: Not on file  Intimate Partner Violence: Not on file    Review of systems: Review of Systems  Constitutional: Negative for fever and chills.  HENT: Negative.   Eyes: Negative for blurred vision.  Respiratory: as per HPI  Cardiovascular: Negative for chest pain and palpitations.  Gastrointestinal: Negative for vomiting, diarrhea, blood per rectum. Genitourinary: Negative for dysuria, urgency, frequency and hematuria.  Musculoskeletal: Negative  for myalgias, back pain and joint pain.  Skin: Negative for itching and rash.  Neurological: Negative for dizziness, tremors, focal weakness, seizures and loss of consciousness.  Endo/Heme/Allergies: Negative for environmental allergies.  Psychiatric/Behavioral: Negative for depression, suicidal ideas and hallucinations.  All other systems reviewed and are negative.  Physical Exam: Blood pressure 122/72, pulse 70, temperature 98.2 F (36.8 C), temperature source Oral, height 5\' 6"  (1.676 m), weight 258 lb 3.2 oz (117.1 kg), SpO2 96 %. Gen:      No acute distress HEENT:  EOMI, sclera anicteric Neck:     No masses; no thyromegaly Lungs:    Clear to auscultation bilaterally; normal respiratory effort CV:         Regular rate and rhythm; no murmurs Abd:      + bowel sounds; soft, non-tender; no palpable masses, no distension Ext:    No edema; adequate peripheral perfusion Skin:      Warm and dry; no rash Neuro: alert and oriented x 3 Psych: normal mood and affect  Data Reviewed: Imaging: [facility]A from [facility] 12/17/2020-no evidence of PE, mild cardiomegaly with mild interstitial edema, enlarged thyroid gland with calcifications.  PFTs:  Labs: CBC 05/23/2017-WBC 4.6, eos 4.3%, absolute eosinophil count 244  Cardiac: Echocardiogram 12/19/2020-LVEF 65-70%, normal RV size and function  Assessment:  Consult for dyspnea Has history of asthma, COPD Currently just on  albuterol Check CBC differential, IgE, alpha-1 antitrypsin levels and phenotype Schedule pulm function test Start Trelegy 200 inhaler  History of COVID-19 No clear evidence of COVID ILD.  If there is restriction or diffusion impairment on PFTs then consider high-res CT  Plan/Recommendations: Labs PFTs Trelegy  This appointment required  minutes of patient care (this includes precharting, chart review, review of results, face-to-face care, etc.).  Chilton Greathouse MD [facility] 01/14/2021, 4:36 PM  CC: Royann Shivers, *

## 2021-01-14 NOTE — Patient Instructions (Signed)
We will get some labs today including CBC differential, IgE, alpha-1 antitrypsin levels and phenotype Schedule pulmonary function test Start Trelegy 200 inhaler  Follow-up in 1 to 2 months after PFTs

## 2021-01-14 NOTE — Progress Notes (Deleted)
[facility]    [id number]    09/20/1945  Primary Care Physician:Skillman, Helane Rima, PA-C  Referring Physician: Royann Shivers, PA-C [address] [place],  [place] [address]  Chief complaint:  ***  HPI:  ***  Pets: Occupation: Exposures: Smoking history: Travel history: Relevant family history:   Outpatient Encounter Medications as of 01/14/2021  Medication Sig   albuterol (PROVENTIL HFA;VENTOLIN HFA) 108 (90 Base) MCG/ACT inhaler Inhale 1-2 puffs into the lungs every 4 (four) hours as needed for wheezing or shortness of breath.   aspirin EC 325 MG EC tablet Take 1 tablet (325 mg total) by mouth daily with breakfast.   cephALEXin (KEFLEX) 500 MG capsule Take 500 mg by mouth 3 (three) times daily.   cetirizine (ZYRTEC) 10 MG tablet Take 10 mg by mouth as needed for allergies.   Cholecalciferol (VITAMIN D3) 25 MCG (1000 UT) CAPS Take 1 capsule by mouth daily.   docusate sodium (COLACE) 100 MG capsule Take 1 capsule (100 mg total) by mouth 2 (two) times daily.   famotidine (PEPCID) 20 MG tablet Take 20 mg by mouth 2 (two) times daily.   FLUoxetine (PROZAC) 20 MG capsule Take 20 mg by mouth daily.   HYDROcodone-acetaminophen (NORCO) 5-325 MG tablet Take 1 tablet by mouth every 6 (six) hours as needed for moderate pain.   IBU 600 MG tablet Take 1 tablet (600 mg total) by mouth every 6 (six) hours as needed.   lamoTRIgine (LAMICTAL) 150 MG tablet Take 150 mg by mouth at bedtime.   lansoprazole (PREVACID) 15 MG capsule Take 15 mg by mouth daily at 12 noon.   levothyroxine (SYNTHROID, LEVOTHROID) 50 MCG tablet Take 50 mcg by mouth daily before breakfast.    methocarbamol (ROBAXIN) 500 MG tablet Take 1 tablet (500 mg total) by mouth 4 (four) times daily.   Omega-3 Fatty Acids (FISH OIL) 1000 MG CAPS Take 1 capsule by mouth daily.   No facility-administered encounter medications on file as of 01/14/2021.    Allergies as of 01/14/2021 - Review Complete 01/14/2021   Allergen Reaction Noted   Levofloxacin Other (See Comments) 01/30/2017   Penicillins     Streptomycin     Sulfonamide derivatives      Past Medical History:  Diagnosis Date   Arthritis    Asthma    Chest pain    w abnormal ECg   CHF (congestive heart failure) (HCC)    Chronic headaches    migraine   COPD (chronic obstructive pulmonary disease) (HCC)    Dyslipidemia    Dyspnea    Dyspnea    Glucose intolerance (impaired glucose tolerance)    Hemorrhoids 03/25/2015   Hypothyroidism    Seizure disorder (HCC)    on Lamictal; has not had seizure in 15 years   Seizures (HCC)    Sleep apnea    lost weight and does not need CPAP any more.   Varicose veins 03/25/2015   Venous insufficiency     Past Surgical History:  Procedure Laterality Date   EXAM UNDER ANESTHESIA WITH MANIPULATION OF KNEE Right 04/05/2017   Procedure: EXAM UNDER ANESTHESIA WITH MANIPULATION OF RIGHT KNEE;  Surgeon: Vickki Hearing, MD;  Location: [facility];  Service: Orthopedics;  Laterality: Right;   KNEE ARTHROSCOPY Left    KNEE ARTHROSCOPY WITH MEDIAL MENISECTOMY Right 06/28/2016   Procedure: KNEE ARTHROSCOPY WITH MEDIAL MENISECTOMY;  Surgeon: Vickki Hearing, MD;  Location: [facility];  Service:  Orthopedics;  Laterality: Right;   left vein stripping     status post bilateral tubaligation     TONSILLECTOMY     many years ago   TOTAL KNEE ARTHROPLASTY Right 02/13/2017   Procedure: TOTAL KNEE ARTHROPLASTY;  Surgeon: Vickki Hearing, MD;  Location: [facility];  Service: Orthopedics;  Laterality: Right;   TUBAL LIGATION      Family History  Problem Relation Age of Onset   Heart attack Mother    Tuberculosis Father    Heart disease Father        cardiac disease   Cirrhosis Father    Factor V Leiden deficiency Other    Seizures Son     Social History   Socioeconomic History   Marital status: Married    Spouse name: Not on file   Number of children: Not on file   Years of education: Not on file    Highest education level: Not on file  Occupational History   Not on file  Tobacco Use   Smoking status: Former    Packs/day: 2.50    Years: 20.00    Pack years: 50.00    Types: Cigarettes    Quit date: 06/23/1986    Years since quitting: 34.5   Smokeless tobacco: Never  Vaping Use   Vaping Use: Never used  Substance and Sexual Activity   Alcohol use: No   Drug use: No   Sexual activity: Yes    Birth control/protection: Post-menopausal  Other Topics Concern   Not on file  Social History Narrative   Full Time, works at [employer].    Social Determinants of Health   Financial Resource Strain: Not on file  Food Insecurity: Not on file  Transportation Needs: Not on file  Physical Activity: Not on file  Stress: Not on file  Social Connections: Not on file  Intimate Partner Violence: Not on file    Review of systems: Review of Systems  Constitutional: Negative for fever and chills.  HENT: Negative.   Eyes: Negative for blurred vision.  Respiratory: as per HPI  Cardiovascular: Negative for chest pain and palpitations.  Gastrointestinal: Negative for vomiting, diarrhea, blood per rectum. Genitourinary: Negative for dysuria, urgency, frequency and hematuria.  Musculoskeletal: Negative for myalgias, back pain and joint pain.  Skin: Negative for itching and rash.  Neurological: Negative for dizziness, tremors, focal weakness, seizures and loss of consciousness.  Endo/Heme/Allergies: Negative for environmental allergies.  Psychiatric/Behavioral: Negative for depression, suicidal ideas and hallucinations.  All other systems reviewed and are negative.  Physical Exam: Blood pressure 122/72, pulse 70, temperature 98.2 F (36.8 C), temperature source Oral, height 5\' 6"  (1.676 m), weight 258 lb 3.2 oz (117.1 kg), SpO2 96 %. Gen:      No acute distress HEENT:  EOMI, sclera anicteric Neck:     No masses; no thyromegaly Lungs:    Clear to auscultation bilaterally; normal respiratory  effort*** CV:         Regular rate and rhythm; no murmurs Abd:      + bowel sounds; soft, non-tender; no palpable masses, no distension Ext:    No edema; adequate peripheral perfusion Skin:      Warm and dry; no rash Neuro: alert and oriented x 3 Psych: normal mood and affect  Data Reviewed: Imaging:  PFTs:  Labs:  Assessment:  ***  Plan/Recommendations: *** This appointment required *** minutes of patient care (this includes precharting, chart review, review of results, face-to-face care, etc.).  Melaysia Streed  MD [facility] 01/14/2021, 4:39 PM  CC: Royann Shivers, *

## 2021-01-18 ENCOUNTER — Other Ambulatory Visit: Payer: Self-pay

## 2021-01-18 ENCOUNTER — Ambulatory Visit: Payer: Medicare Other | Admitting: Nurse Practitioner

## 2021-01-18 ENCOUNTER — Encounter: Payer: Self-pay | Admitting: Nurse Practitioner

## 2021-01-18 VITALS — BP 137/79 | HR 70 | Ht 64.0 in | Wt 258.4 lb

## 2021-01-18 DIAGNOSIS — E041 Nontoxic single thyroid nodule: Secondary | ICD-10-CM

## 2021-01-18 NOTE — Progress Notes (Signed)
Endocrinology Consult Note                                            01/18/2021, 1:57 PM   Subjective:    Patient ID: Isabella Holmes, female    DOB: 1945-11-09, PCP Sheela Stack   Past Medical History:  Diagnosis Date   Arthritis    Asthma    Chest pain    w abnormal ECg   CHF (congestive heart failure) (HCC)    Chronic headaches    migraine   COPD (chronic obstructive pulmonary disease) (HCC)    Dyslipidemia    Dyspnea    Dyspnea    Glucose intolerance (impaired glucose tolerance)    Hemorrhoids 03/25/2015   Hypothyroidism    Seizure disorder (HCC)    on Lamictal; has not had seizure in 15 years   Seizures (HCC)    Sleep apnea    lost weight and does not need CPAP any more.   Varicose veins 03/25/2015   Venous insufficiency    Past Surgical History:  Procedure Laterality Date   EXAM UNDER ANESTHESIA WITH MANIPULATION OF KNEE Right 04/05/2017   Procedure: EXAM UNDER ANESTHESIA WITH MANIPULATION OF RIGHT KNEE;  Surgeon: Vickki Hearing, MD;  Location: [facility];  Service: Orthopedics;  Laterality: Right;   KNEE ARTHROSCOPY Left    KNEE ARTHROSCOPY WITH MEDIAL MENISECTOMY Right 06/28/2016   Procedure: KNEE ARTHROSCOPY WITH MEDIAL MENISECTOMY;  Surgeon: Vickki Hearing, MD;  Location: [facility];  Service: Orthopedics;  Laterality: Right;   left vein stripping     status post bilateral tubaligation     TONSILLECTOMY     many years ago   TOTAL KNEE ARTHROPLASTY Right 02/13/2017   Procedure: TOTAL KNEE ARTHROPLASTY;  Surgeon: Vickki Hearing, MD;  Location: [facility];  Service: Orthopedics;  Laterality: Right;   TUBAL LIGATION     Social History   Socioeconomic History   Marital status: Married    Spouse name: Not on file   Number of children: Not on file   Years of education: Not on file   Highest education level: Not on file  Occupational History   Not on file  Tobacco Use   Smoking status: Former    Packs/day: 2.50    Years: 20.00     Pack years: 50.00    Types: Cigarettes    Quit date: 06/23/1986    Years since quitting: 34.5   Smokeless tobacco: Never  Vaping Use   Vaping Use: Never used  Substance and Sexual Activity   Alcohol use: No   Drug use: No   Sexual activity: Yes    Birth control/protection: Post-menopausal  Other Topics Concern   Not on file  Social History Narrative   Full Time, works at [employer].    Social Determinants of Health   Financial Resource Strain: Not on file  Food Insecurity: Not on file  Transportation Needs: Not on file  Physical Activity: Not on file  Stress: Not on file  Social Connections: Not on file   Family History  Problem Relation Age of Onset   Heart attack Mother    Tuberculosis Father    Heart disease Father        cardiac disease   Cirrhosis Father    Factor V Leiden deficiency Other    Seizures  Son    Outpatient Encounter Medications as of 01/18/2021  Medication Sig   albuterol (PROVENTIL HFA;VENTOLIN HFA) 108 (90 Base) MCG/ACT inhaler Inhale 1-2 puffs into the lungs every 4 (four) hours as needed for wheezing or shortness of breath.   albuterol (PROVENTIL) (2.5 MG/3ML) 0.083% nebulizer solution Take 2.5 mg by nebulization every 6 (six) hours as needed.   aspirin EC 325 MG EC tablet Take 1 tablet (325 mg total) by mouth daily with breakfast. (Patient taking differently: Take 325 mg by mouth as needed.)   cetirizine (ZYRTEC) 10 MG tablet Take 10 mg by mouth as needed for allergies.   Cholecalciferol (VITAMIN D3) 25 MCG (1000 UT) CAPS Take 1 capsule by mouth daily.   docusate sodium (COLACE) 100 MG capsule Take 1 capsule (100 mg total) by mouth 2 (two) times daily. (Patient taking differently: Take 100 mg by mouth as needed.)   famotidine (PEPCID) 20 MG tablet Take 20 mg by mouth 2 (two) times daily.   FLUoxetine (PROZAC) 20 MG capsule Take 20 mg by mouth daily.   Fluticasone-Umeclidin-Vilant (TRELEGY ELLIPTA) 200-62.5-25 MCG/ACT AEPB Inhale 1 puff into the lungs  daily.   IBU 600 MG tablet Take 1 tablet (600 mg total) by mouth every 6 (six) hours as needed.   lamoTRIgine (LAMICTAL) 150 MG tablet Take 150 mg by mouth at bedtime.   levothyroxine (SYNTHROID, LEVOTHROID) 50 MCG tablet Take 50 mcg by mouth daily before breakfast.    Omega-3 Fatty Acids (FISH OIL) 1000 MG CAPS Take 1 capsule by mouth daily.   cephALEXin (KEFLEX) 500 MG capsule Take 500 mg by mouth 3 (three) times daily. (Patient not taking: Reported on 01/18/2021)   furosemide (LASIX) 40 MG tablet Take 40 mg by mouth daily as needed. (Patient not taking: Reported on 01/18/2021)   HYDROcodone-acetaminophen (NORCO) 5-325 MG tablet Take 1 tablet by mouth every 6 (six) hours as needed for moderate pain. (Patient not taking: Reported on 01/18/2021)   lansoprazole (PREVACID) 15 MG capsule Take 15 mg by mouth daily at 12 noon. (Patient not taking: Reported on 01/18/2021)   methocarbamol (ROBAXIN) 500 MG tablet Take 1 tablet (500 mg total) by mouth 4 (four) times daily. (Patient not taking: Reported on 01/18/2021)   No facility-administered encounter medications on file as of 01/18/2021.   ALLERGIES: Allergies  Allergen Reactions   Penicillins Itching    REACTION: rash Has patient had a PCN reaction causing immediate rash, facial/tongue/throat swelling, SOB or lightheadedness with hypotension: No Has patient had a PCN reaction causing severe rash involving mucus membranes or skin necrosis: No Has patient had a PCN reaction that required hospitalization No Has patient had a PCN reaction occurring within the last 10 years: No If all of the above answers are "NO", then may proceed with Cephalosporin use.    Levofloxacin Other (See Comments)    Recommended by Dr due to family hx   Streptomycin     REACTION: rash   Sulfa Antibiotics Itching   Sulfonamide Derivatives     REACTION: rash    VACCINATION STATUS: Immunization History  Administered Date(s) Administered   Influenza, High Dose Seasonal PF  12/19/2020   Moderna Sars-Covid-2 Vaccination 05/08/2019, 06/06/2019   Pneumococcal Polysaccharide-23 08/23/2008, 12/19/2020    HPI Isabella Holmes is 75 y.o. female who presents today with a medical history as above. she is being seen in consultation for incidental finding of thyroid nodule during a recent hospitalization for CHF exacerbation requested by Sheela Stack.  she  has been dealing with symptoms of difficulty breathing, difficulty swallowing (especially nuts), and fatigue for a couple of months but it is getting progressively worse.    she denies palpitations, tremors, recent weight gain/loss, anxiety, etc.  She was diagnosed with hypothyroidism at approx age of 40.  She has never had any imaging of her thyroid in the past.  She denies taking Biotin supplement, is currently on Levothyroxine 50 mcg po daily before breakfast.  She reports compliance to this medication, taking it on an empty stomach, separated by other medications and food for at least 30 minutes.   She does have family history of thyroid dysfunction in one of her sisters (she thinks it was a goiter, but she did not require surgery) and her daughter (Hashimoto's).    Review of Systems  Constitutional: no recent weight gain/loss, + fatigue, no subjective hyperthermia, no subjective hypothermia Eyes: no blurry vision, no xerophthalmia ENT: no sore throat, no nodules palpated in throat, + dysphagia, no odynophagia, no hoarseness Cardiovascular: no Chest Pain, + Shortness of Breath, no palpitations, no leg swelling Respiratory: no cough, no shortness of breath Gastrointestinal: no Nausea/Vomiting/Diarrhea Musculoskeletal: no muscle/joint aches Skin: no rashes Neurological: no tremors, no numbness, no tingling, no dizziness Psychiatric: no depression, no anxiety  Objective:   BP 137/79   Pulse 70   Ht 5\' 4"  (1.626 m)   Wt 258 lb 6.4 oz (117.2 kg)   BMI 44.35 kg/m   Wt Readings from Last 3  Encounters:  01/18/21 258 lb 6.4 oz (117.2 kg)  01/14/21 258 lb 3.2 oz (117.1 kg)  11/26/20 261 lb 6.4 oz (118.6 kg)     BP Readings from Last 3 Encounters:  01/18/21 137/79  01/14/21 122/72  01/02/21 (!) 156/72    Physical Exam- Limited  Constitutional:  Body mass index is 44.35 kg/m. , not in acute distress, normal state of mind Eyes:  EOMI, no exophthalmos Neck: Supple Thyroid: No gross goiter Cardiovascular: RRR, no murmurs, rubs, or gallops, no edema Respiratory: Adequate breathing efforts, no crackles, rales, rhonchi, or wheezing Musculoskeletal: no gross deformities, strength intact in all four extremities, no gross restriction of joint movements Skin:  no rashes, no hyperemia Neurological: no tremor with outstretched hands  CMP ( most recent) CMP     Component Value Date/Time   NA 138 01/02/2021 1238   K 4.2 01/02/2021 1238   CL 101 01/02/2021 1238   CO2 30 01/02/2021 1238   GLUCOSE 100 (H) 01/02/2021 1238   BUN 12 01/02/2021 1238   CREATININE 0.92 01/02/2021 1238   CALCIUM 9.8 01/02/2021 1238   PROT 6.9 02/08/2017 1510   ALBUMIN 4.1 02/08/2017 1510   AST 28 02/08/2017 1510   ALT 27 02/08/2017 1510   ALKPHOS 277 (H) 02/08/2017 1510   BILITOT 0.7 02/08/2017 1510   GFRNONAA >60 01/02/2021 1238   GFRAA >60 03/26/2019 1454     Diabetic Labs (most recent): No results found for: HGBA1C   Lipid Panel ( most recent) Lipid Panel  No results found for: CHOL, TRIG, HDL, CHOLHDL, VLDL, LDLCALC, LDLDIRECT, LABVLDL    Lab Results  Component Value Date   TSH 1.01 12/17/2020           Assessment & Plan:   1. Thyroid nodule-incidental finding  - ANKITA FABIANO is being seen at a kind request of Royann Shivers, PA-C. - I have reviewed her available records and clinically evaluated the patient.  During a recent hospitalization for CHF exacerbation an  incidental finding of thyroid nodule was found during CTA of her chest.  Will order dedicated  thyroid imaging with thyroid ultrasound prior to next visit to rule out thyroids involvement in her symptoms of shortness of breath and difficulty swallowing.  She also has other specialist appointments coming up to rule out lung vs cardiac involvement.   Will also check thyroid labs including TSH, FT4, FT3, and antibody testing to help classify her dysfunction.  She is advised to continue her current dose of Levothyroxine 50 mcg po daily before breakfast.   - The correct intake of thyroid hormone (Levothyroxine, Synthroid), is on empty stomach first thing in the morning, with water, separated by at least 30 minutes from breakfast and other medications,  and separated by more than 4 hours from calcium, iron, multivitamins, acid reflux medications (PPIs).  - This medication is a life-long medication and will be needed to correct thyroid hormone imbalances for the rest of your life.  The dose may change from time to time, based on thyroid blood work.  - It is extremely important to be consistent taking this medication, near the same time each morning.  -AVOID TAKING PRODUCTS CONTAINING BIOTIN (commonly found in Hair, Skin, Nails vitamins) AS IT INTERFERES WITH THE VALIDITY OF THYROID FUNCTION BLOOD TESTS.       - she is advised to maintain close follow up with Royann Shivers, PA-C for primary care needs.   - Time spent with the patient: 45 minutes, of which >50% was spent in  counseling her about her thyroid nodule and the rest in obtaining information about her symptoms, reviewing her previous labs/studies (including abstractions from other facilities),  evaluations, and treatments,  and developing a plan to confirm diagnosis and long term treatment based on the latest standards of care/guidelines; and documenting her care.  Isabella Holmes participated in the discussions, expressed understanding, and voiced agreement with the above plans.  All questions were answered to her satisfaction.  she is encouraged to contact clinic should she have any questions or concerns prior to her return visit.  Follow up plan: Return in about 3 weeks (around 02/08/2021) for Thyroid follow up, Previsit labs, thyroid ultrasound.   Ronny Bacon, [facility] [address] [place], [place] [address] Phone: [phone] Fax: [phone]    01/18/2021, 1:57 PM

## 2021-01-18 NOTE — Patient Instructions (Signed)
Thyroid Nodule A thyroid nodule is an isolated growth of thyroid cells that forms a lump in your thyroid gland. The thyroid gland is a butterfly-shaped gland. It is found in the lower front of your neck. This gland sends chemical messengers (hormones) through your blood to all parts of your body. These hormones are important in regulating your body temperature and helping your body to use energy. Thyroid nodules are common. Most are not cancerous (benign). You may have one nodule or several nodules. Different types of thyroid nodules include nodules that: Grow and fill with fluid (thyroid cysts). Produce too much thyroid hormone (hot nodules or hyperthyroid). Produce no thyroid hormone (cold nodules or hypothyroid). Form from cancer cells (thyroid cancers). What are the causes? In most cases, the cause of this condition is not known. What increases the risk? The following factors may make you more likely to develop this condition. Age. Thyroid nodules become more common in people who are older than 73 years of age. Gender. Benign thyroid nodules are more common in women. Cancerous (malignant) thyroid nodules are more common in men. A family history that includes: Thyroid nodules. Pheochromocytoma. Thyroid carcinoma. Hyperparathyroidism. Certain kinds of thyroid diseases, such as Hashimoto's thyroiditis. Lack of iodine in your diet. A history of head and neck radiation, such as from previous cancer treatment. What are the signs or symptoms? In many cases, there are no symptoms. If you have symptoms, they may include: A lump in your lower neck. Feeling a lump or tickle in your throat. Pain in your neck, jaw, or ear. Having trouble swallowing. Hot nodules may cause symptoms that include: Weight loss. Warm, flushed skin. Feeling hot. Feeling nervous. A racing heartbeat. Cold nodules may cause symptoms that include: Weight gain. Dry skin. Brittle hair. This may also occur with hair  loss. Feeling cold. Fatigue. Thyroid cancer nodules may cause symptoms that include: Hard nodules that feel stuck to the thyroid gland. Hoarseness. Lumps in the glands near your thyroid (lymph nodes). How is this diagnosed? A thyroid nodule may be felt by your health care provider during a physical exam. This condition may also be diagnosed based on your symptoms. You may also have tests, including: An ultrasound. This may be done to confirm the diagnosis. A biopsy. This involves taking a sample from the nodule and looking at it under a microscope. Blood tests to make sure that your thyroid is working properly. A thyroid scan. This test uses a radioactive tracer injected into a vein to create an image of the thyroid gland on a computer screen. Imaging tests such as MRI or CT scan. These may be done if: Your nodule is large. Your nodule is blocking your airway. Cancer is suspected. How is this treated? Treatment depends on the cause and size of your nodule or nodules. If the nodule is benign, treatment may not be necessary. Your health care provider may monitor the nodule to see if it goes away without treatment. If the nodule continues to grow, is cancerous, or does not go away, treatment may be needed. Treatment may include: Having a cystic nodule drained with a needle. Ablation therapy. In this treatment, alcohol is injected into the area of the nodule to destroy the cells. Ablation with heat (thermal ablation) may also be used. Radioactive iodine. In this treatment, radioactive iodine is given as a pill or liquid that you drink. This substance causes the thyroid nodule to shrink. Surgery to remove the nodule. Part or all of your thyroid gland may  need to be removed as well. Medicines. Follow these instructions at home: Pay attention to any changes in your nodule. Take over-the-counter and prescription medicines only as told by your health care provider. Keep all follow-up visits as told  by your health care provider. This is important. Contact a health care provider if: Your voice changes. You have trouble swallowing. You have pain in your neck, ear, or jaw that is getting worse. Your nodule gets bigger. Your nodule starts to make it harder for you to breathe. Your muscles look like they are shrinking (muscle wasting). Get help right away if: You have chest pain. There is a loss of consciousness. You have a sudden fever. You feel confused. You are seeing or hearing things that other people do not see or hear (having hallucinations). You feel very weak. You have mood swings. You feel very restless. You feel suddenly nauseous or throw up. You suddenly have diarrhea. Summary A thyroid nodule is an isolated growth of thyroid cells that forms a lump in your thyroid gland. Thyroid nodules are common. Most are not cancerous (benign). You may have one nodule or several nodules. Treatment depends on the cause and size of your nodule or nodules. If the nodule is benign, treatment may not be necessary. Your health care provider may monitor the nodule to see if it goes away without treatment. If the nodule continues to grow, is cancerous, or does not go away, treatment may be needed. This information is not intended to replace advice given to you by your health care provider. Make sure you discuss any questions you have with your health care provider. Document Revised: 10/12/2017 Document Reviewed: 10/15/2017 Elsevier Patient Education  [place].

## 2021-01-25 ENCOUNTER — Telehealth: Payer: Self-pay | Admitting: Nurse Practitioner

## 2021-01-25 NOTE — Telephone Encounter (Signed)
Left a message requesting a return call to the office. 

## 2021-01-25 NOTE — Telephone Encounter (Signed)
Pt left voicemail that nobody has reached out to schedule her ultrasound yet, she requested a nurse call her back.

## 2021-01-31 NOTE — Telephone Encounter (Signed)
Pt made aware of her appointment on 02-01-21 for her thyroid ultrasound at 1:30p.m.

## 2021-02-01 ENCOUNTER — Ambulatory Visit (HOSPITAL_COMMUNITY)
Admission: RE | Admit: 2021-02-01 | Discharge: 2021-02-01 | Disposition: A | Discharge status: Home / Self Care | Payer: Medicare Other | Source: Ambulatory Visit | Attending: Nurse Practitioner | Admitting: Nurse Practitioner

## 2021-02-01 ENCOUNTER — Other Ambulatory Visit: Payer: Self-pay

## 2021-02-01 DIAGNOSIS — E041 Nontoxic single thyroid nodule: Secondary | ICD-10-CM | POA: Diagnosis not present

## 2021-02-01 DIAGNOSIS — E049 Nontoxic goiter, unspecified: Secondary | ICD-10-CM | POA: Diagnosis not present

## 2021-02-08 ENCOUNTER — Other Ambulatory Visit (HOSPITAL_COMMUNITY)
Admission: RE | Admit: 2021-02-08 | Discharge: 2021-02-08 | Disposition: A | Discharge status: Home / Self Care | Payer: Medicare Other | Source: Ambulatory Visit | Attending: Nurse Practitioner | Admitting: Nurse Practitioner

## 2021-02-08 ENCOUNTER — Other Ambulatory Visit (HOSPITAL_COMMUNITY)
Admission: RE | Admit: 2021-02-08 | Discharge: 2021-02-08 | Disposition: A | Discharge status: Home / Self Care | Payer: Medicare Other | Source: Ambulatory Visit | Attending: Pulmonary Disease | Admitting: Pulmonary Disease

## 2021-02-08 DIAGNOSIS — E041 Nontoxic single thyroid nodule: Secondary | ICD-10-CM | POA: Insufficient documentation

## 2021-02-08 DIAGNOSIS — R0609 Other forms of dyspnea: Secondary | ICD-10-CM | POA: Insufficient documentation

## 2021-02-08 LAB — CBC WITH DIFFERENTIAL/PLATELET
Abs Immature Granulocytes: 0.01 10*3/uL (ref 0.00–0.07)
Basophils Absolute: 0 10*3/uL (ref 0.0–0.1)
Basophils Relative: 1 %
Eosinophils Absolute: 0.2 10*3/uL (ref 0.0–0.5)
Eosinophils Relative: 4 %
HCT: 39.2 % (ref 36.0–46.0)
Hemoglobin: 12.9 g/dL (ref 12.0–15.0)
Immature Granulocytes: 0 %
Lymphocytes Relative: 38 %
Lymphs Abs: 1.5 10*3/uL (ref 0.7–4.0)
MCH: 30.1 pg (ref 26.0–34.0)
MCHC: 32.9 g/dL (ref 30.0–36.0)
MCV: 91.6 fL (ref 80.0–100.0)
Monocytes Absolute: 0.4 10*3/uL (ref 0.1–1.0)
Monocytes Relative: 10 %
Neutro Abs: 1.9 10*3/uL (ref 1.7–7.7)
Neutrophils Relative %: 47 %
Platelets: 212 10*3/uL (ref 150–400)
RBC: 4.28 MIL/uL (ref 3.87–5.11)
RDW: 12.9 % (ref 11.5–15.5)
WBC: 4.1 10*3/uL (ref 4.0–10.5)
nRBC: 0 % (ref 0.0–0.2)

## 2021-02-08 LAB — T4, FREE: Free T4: 0.97 ng/dL (ref 0.61–1.12)

## 2021-02-08 LAB — TSH: TSH: 2.065 u[IU]/mL (ref 0.350–4.500)

## 2021-02-08 NOTE — Patient Instructions (Signed)
Thyroid Nodule A thyroid nodule is an isolated growth of thyroid cells that forms a lump in your thyroid gland. The thyroid gland is a butterfly-shaped gland. It is found in the lower front of your neck. This gland sends chemical messengers (hormones) through your blood to all parts of your body. These hormones are important in regulating your body temperature and helping your body to use energy. Thyroid nodules are common. Most are not cancerous (benign). You may have one nodule or several nodules. Different types of thyroid nodules include nodules that: Grow and fill with fluid (thyroid cysts). Produce too much thyroid hormone (hot nodules or hyperthyroid). Produce no thyroid hormone (cold nodules or hypothyroid). Form from cancer cells (thyroid cancers). What are the causes? In most cases, the cause of this condition is not known. What increases the risk? The following factors may make you more likely to develop this condition. Age. Thyroid nodules become more common in people who are older than 73 years of age. Gender. Benign thyroid nodules are more common in women. Cancerous (malignant) thyroid nodules are more common in men. A family history that includes: Thyroid nodules. Pheochromocytoma. Thyroid carcinoma. Hyperparathyroidism. Certain kinds of thyroid diseases, such as Hashimoto's thyroiditis. Lack of iodine in your diet. A history of head and neck radiation, such as from previous cancer treatment. What are the signs or symptoms? In many cases, there are no symptoms. If you have symptoms, they may include: A lump in your lower neck. Feeling a lump or tickle in your throat. Pain in your neck, jaw, or ear. Having trouble swallowing. Hot nodules may cause symptoms that include: Weight loss. Warm, flushed skin. Feeling hot. Feeling nervous. A racing heartbeat. Cold nodules may cause symptoms that include: Weight gain. Dry skin. Brittle hair. This may also occur with hair  loss. Feeling cold. Fatigue. Thyroid cancer nodules may cause symptoms that include: Hard nodules that feel stuck to the thyroid gland. Hoarseness. Lumps in the glands near your thyroid (lymph nodes). How is this diagnosed? A thyroid nodule may be felt by your health care provider during a physical exam. This condition may also be diagnosed based on your symptoms. You may also have tests, including: An ultrasound. This may be done to confirm the diagnosis. A biopsy. This involves taking a sample from the nodule and looking at it under a microscope. Blood tests to make sure that your thyroid is working properly. A thyroid scan. This test uses a radioactive tracer injected into a vein to create an image of the thyroid gland on a computer screen. Imaging tests such as MRI or CT scan. These may be done if: Your nodule is large. Your nodule is blocking your airway. Cancer is suspected. How is this treated? Treatment depends on the cause and size of your nodule or nodules. If the nodule is benign, treatment may not be necessary. Your health care provider may monitor the nodule to see if it goes away without treatment. If the nodule continues to grow, is cancerous, or does not go away, treatment may be needed. Treatment may include: Having a cystic nodule drained with a needle. Ablation therapy. In this treatment, alcohol is injected into the area of the nodule to destroy the cells. Ablation with heat (thermal ablation) may also be used. Radioactive iodine. In this treatment, radioactive iodine is given as a pill or liquid that you drink. This substance causes the thyroid nodule to shrink. Surgery to remove the nodule. Part or all of your thyroid gland may  need to be removed as well. Medicines. Follow these instructions at home: Pay attention to any changes in your nodule. Take over-the-counter and prescription medicines only as told by your health care provider. Keep all follow-up visits as told  by your health care provider. This is important. Contact a health care provider if: Your voice changes. You have trouble swallowing. You have pain in your neck, ear, or jaw that is getting worse. Your nodule gets bigger. Your nodule starts to make it harder for you to breathe. Your muscles look like they are shrinking (muscle wasting). Get help right away if: You have chest pain. There is a loss of consciousness. You have a sudden fever. You feel confused. You are seeing or hearing things that other people do not see or hear (having hallucinations). You feel very weak. You have mood swings. You feel very restless. You feel suddenly nauseous or throw up. You suddenly have diarrhea. Summary A thyroid nodule is an isolated growth of thyroid cells that forms a lump in your thyroid gland. Thyroid nodules are common. Most are not cancerous (benign). You may have one nodule or several nodules. Treatment depends on the cause and size of your nodule or nodules. If the nodule is benign, treatment may not be necessary. Your health care provider may monitor the nodule to see if it goes away without treatment. If the nodule continues to grow, is cancerous, or does not go away, treatment may be needed. This information is not intended to replace advice given to you by your health care provider. Make sure you discuss any questions you have with your health care provider. Document Revised: 10/12/2017 Document Reviewed: 10/15/2017 Elsevier Patient Education  [place].

## 2021-02-09 ENCOUNTER — Other Ambulatory Visit: Payer: Self-pay

## 2021-02-09 ENCOUNTER — Ambulatory Visit: Payer: Medicare Other | Admitting: Nurse Practitioner

## 2021-02-09 ENCOUNTER — Encounter: Payer: Self-pay | Admitting: Nurse Practitioner

## 2021-02-09 VITALS — BP 143/85 | HR 67 | Ht 66.0 in | Wt 255.0 lb

## 2021-02-09 DIAGNOSIS — E041 Nontoxic single thyroid nodule: Secondary | ICD-10-CM

## 2021-02-09 LAB — ALPHA-1-ANTITRYPSIN: A-1 Antitrypsin, Ser: 156 mg/dL (ref 101–187)

## 2021-02-09 LAB — THYROGLOBULIN ANTIBODY: Thyroglobulin Antibody: <1 IU/mL (ref 0.0–0.9)

## 2021-02-09 LAB — T3, FREE: T3, Free: 3.1 pg/mL (ref 2.0–4.4)

## 2021-02-09 LAB — THYROID PEROXIDASE ANTIBODY: Thyroperoxidase Ab SerPl-aCnc: 32 IU/mL (ref 0–34)

## 2021-02-09 NOTE — Progress Notes (Signed)
Endocrinology Consult Note                                            02/09/2021, 3:03 PM   Subjective:    Patient ID: Isabella Holmes, female    DOB: 16-Aug-1945, PCP Sheela Stack   Past Medical History:  Diagnosis Date   Arthritis    Asthma    Chest pain    w abnormal ECg   CHF (congestive heart failure) (HCC)    Chronic headaches    migraine   COPD (chronic obstructive pulmonary disease) (HCC)    Dyslipidemia    Dyspnea    Dyspnea    Glucose intolerance (impaired glucose tolerance)    Hemorrhoids 03/25/2015   Hypothyroidism    Seizure disorder (HCC)    on Lamictal; has not had seizure in 15 years   Seizures (HCC)    Sleep apnea    lost weight and does not need CPAP any more.   Varicose veins 03/25/2015   Venous insufficiency    Past Surgical History:  Procedure Laterality Date   EXAM UNDER ANESTHESIA WITH MANIPULATION OF KNEE Right 04/05/2017   Procedure: EXAM UNDER ANESTHESIA WITH MANIPULATION OF RIGHT KNEE;  Surgeon: Vickki Hearing, MD;  Location: [facility];  Service: Orthopedics;  Laterality: Right;   KNEE ARTHROSCOPY Left    KNEE ARTHROSCOPY WITH MEDIAL MENISECTOMY Right 06/28/2016   Procedure: KNEE ARTHROSCOPY WITH MEDIAL MENISECTOMY;  Surgeon: Vickki Hearing, MD;  Location: [facility];  Service: Orthopedics;  Laterality: Right;   left vein stripping     status post bilateral tubaligation     TONSILLECTOMY     many years ago   TOTAL KNEE ARTHROPLASTY Right 02/13/2017   Procedure: TOTAL KNEE ARTHROPLASTY;  Surgeon: Vickki Hearing, MD;  Location: [facility];  Service: Orthopedics;  Laterality: Right;   TUBAL LIGATION     Social History   Socioeconomic History   Marital status: Married    Spouse name: Not on file   Number of children: Not on file   Years of education: Not on file   Highest education level: Not on file  Occupational History   Not on file  Tobacco Use   Smoking status: Former    Packs/day: 2.50    Years: 20.00     Pack years: 50.00    Types: Cigarettes    Quit date: 06/23/1986    Years since quitting: 34.6   Smokeless tobacco: Never  Vaping Use   Vaping Use: Never used  Substance and Sexual Activity   Alcohol use: No   Drug use: No   Sexual activity: Yes    Birth control/protection: Post-menopausal  Other Topics Concern   Not on file  Social History Narrative   Full Time, works at [employer]    Social Determinants of Health   Financial Resource Strain: Not on file  Food Insecurity: Not on file  Transportation Needs: Not on file  Physical Activity: Not on file  Stress: Not on file  Social Connections: Not on file   Family History  Problem Relation Age of Onset   Heart attack Mother    Tuberculosis Father    Heart disease Father        cardiac disease   Cirrhosis Father    Factor V Leiden deficiency Other    Seizures  Son    Outpatient Encounter Medications as of 02/09/2021  Medication Sig   albuterol (PROVENTIL HFA;VENTOLIN HFA) 108 (90 Base) MCG/ACT inhaler Inhale 1-2 puffs into the lungs every 4 (four) hours as needed for wheezing or shortness of breath.   albuterol (PROVENTIL) (2.5 MG/3ML) 0.083% nebulizer solution Take 2.5 mg by nebulization every 6 (six) hours as needed.   aspirin EC 325 MG EC tablet Take 1 tablet (325 mg total) by mouth daily with breakfast. (Patient taking differently: Take 325 mg by mouth as needed.)   cetirizine (ZYRTEC) 10 MG tablet Take 10 mg by mouth as needed for allergies.   Cholecalciferol (VITAMIN D3) 25 MCG (1000 UT) CAPS Take 1 capsule by mouth daily.   docusate sodium (COLACE) 100 MG capsule Take 1 capsule (100 mg total) by mouth 2 (two) times daily. (Patient taking differently: Take 100 mg by mouth as needed.)   famotidine (PEPCID) 20 MG tablet Take 20 mg by mouth 2 (two) times daily.   FLUoxetine (PROZAC) 20 MG capsule Take 20 mg by mouth daily.   Fluticasone-Umeclidin-Vilant (TRELEGY ELLIPTA) 200-62.5-25 MCG/ACT AEPB Inhale 1 puff into the lungs  daily.   IBU 600 MG tablet Take 1 tablet (600 mg total) by mouth every 6 (six) hours as needed.   lamoTRIgine (LAMICTAL) 150 MG tablet Take 150 mg by mouth at bedtime.   levothyroxine (SYNTHROID, LEVOTHROID) 50 MCG tablet Take 50 mcg by mouth daily before breakfast.    Omega-3 Fatty Acids (FISH OIL) 1000 MG CAPS Take 1 capsule by mouth daily.   No facility-administered encounter medications on file as of 02/09/2021.   ALLERGIES: Allergies  Allergen Reactions   Penicillins Itching    REACTION: rash Has patient had a PCN reaction causing immediate rash, facial/tongue/throat swelling, SOB or lightheadedness with hypotension: No Has patient had a PCN reaction causing severe rash involving mucus membranes or skin necrosis: No Has patient had a PCN reaction that required hospitalization No Has patient had a PCN reaction occurring within the last 10 years: No If all of the above answers are "NO", then may proceed with Cephalosporin use.    Levofloxacin Other (See Comments)    Recommended by Dr due to family hx   Streptomycin     REACTION: rash   Sulfa Antibiotics Itching   Sulfonamide Derivatives     REACTION: rash    VACCINATION STATUS: Immunization History  Administered Date(s) Administered   Influenza, High Dose Seasonal PF 12/19/2020   Moderna Sars-Covid-2 Vaccination 05/08/2019, 06/06/2019   Pneumococcal Polysaccharide-23 08/23/2008, 12/19/2020    HPI Isabella Holmes is 75 y.o. female who presents today with a medical history as above. she is being seen in consultation for incidental finding of thyroid nodule during a recent hospitalization for CHF exacerbation requested by Royann Shivers, PA-C.  she has been dealing with symptoms of difficulty breathing, difficulty swallowing (especially nuts), and fatigue for a couple of months but it is getting progressively worse.    she denies palpitations, tremors, recent weight gain/loss, anxiety, etc.  She was diagnosed with  hypothyroidism at approx age of 61.  She has never had any imaging of her thyroid in the past.  She denies taking Biotin supplement, is currently on Levothyroxine 50 mcg po daily before breakfast.  She reports compliance to this medication, taking it on an empty stomach, separated by other medications and food for at least 30 minutes.   She does have family history of thyroid dysfunction in one of her sisters (she  thinks it was a goiter, but she did not require surgery) and her daughter (Hashimoto's).    Review of Systems  Constitutional: no recent weight gain/loss, + fatigue, no subjective hyperthermia, no subjective hypothermia Eyes: no blurry vision, no xerophthalmia ENT: no sore throat, no nodules palpated in throat, + dysphagia, no odynophagia, no hoarseness Cardiovascular: no Chest Pain, + Shortness of Breath, no palpitations, no leg swelling Respiratory: no cough, no shortness of breath Gastrointestinal: no Nausea/Vomiting/Diarrhea Musculoskeletal: no muscle/joint aches Skin: no rashes Neurological: no tremors, no numbness, no tingling, no dizziness Psychiatric: no depression, no anxiety  Objective:   BP (!) 143/85   Pulse 67   Ht 5\' 6"  (1.676 m)   Wt 255 lb (115.7 kg)   BMI 41.16 kg/m   Wt Readings from Last 3 Encounters:  02/09/21 255 lb (115.7 kg)  01/18/21 258 lb 6.4 oz (117.2 kg)  01/14/21 258 lb 3.2 oz (117.1 kg)     BP Readings from Last 3 Encounters:  02/09/21 (!) 143/85  01/18/21 137/79  01/14/21 122/72    Physical Exam- Limited  Constitutional:  Body mass index is 41.16 kg/m. , not in acute distress, normal state of mind Eyes:  EOMI, no exophthalmos Neck: Supple Cardiovascular: RRR, no murmurs, rubs, or gallops, no edema Respiratory: Adequate breathing efforts, no crackles, rales, rhonchi, or wheezing Musculoskeletal: no gross deformities, strength intact in all four extremities, no gross restriction of joint movements Skin:  no rashes, no  hyperemia Neurological: no tremor with outstretched hands  CMP ( most recent) CMP     Component Value Date/Time   NA 138 01/02/2021 1238   K 4.2 01/02/2021 1238   CL 101 01/02/2021 1238   CO2 30 01/02/2021 1238   GLUCOSE 100 (H) 01/02/2021 1238   BUN 12 01/02/2021 1238   CREATININE 0.92 01/02/2021 1238   CALCIUM 9.8 01/02/2021 1238   PROT 6.9 02/08/2017 1510   ALBUMIN 4.1 02/08/2017 1510   AST 28 02/08/2017 1510   ALT 27 02/08/2017 1510   ALKPHOS 277 (H) 02/08/2017 1510   BILITOT 0.7 02/08/2017 1510   GFRNONAA >60 01/02/2021 1238   GFRAA >60 03/26/2019 1454     Diabetic Labs (most recent): No results found for: HGBA1C   Lipid Panel ( most recent) Lipid Panel  No results found for: CHOL, TRIG, HDL, CHOLHDL, VLDL, LDLCALC, LDLDIRECT, LABVLDL    Lab Results  Component Value Date   TSH 2.065 02/08/2021   TSH 1.01 12/17/2020   FREET4 0.97 02/08/2021         Latest Reference Range & Units 12/17/20 00:00 02/08/21 16:11 02/08/21 16:12  TSH 0.350 - 4.500 uIU/mL 1.01 (E) 2.065   Triiodothyronine,Free,Serum 2.0 - 4.4 pg/mL  3.1   T4,Free(Direct) 0.61 - 1.12 ng/dL  6.57   Thyroperoxidase Ab SerPl-aCnc 0 - 34 IU/mL   32  Thyroglobulin Antibody 0.0 - 0.9 IU/mL   <1.0  (E): External lab result  Thyroid US from 02/01/21  CLINICAL DATA:  Incidental on other study.   EXAM: THYROID ULTRASOUND   TECHNIQUE: Ultrasound examination of the thyroid gland and adjacent soft tissues was performed.   COMPARISON:  None available   FINDINGS: Parenchymal Echotexture: Mildly heterogenous   Isthmus: 1.5 cm   Right lobe: 6.0 x 3.1 x 2.4 cm   Left lobe: 5.3 x 2.4 x 2.1 cm   _________________________________________________________   Estimated total number of nodules >/= 1 cm: 0   Number of spongiform nodules >/=  2 cm not described below (  TR1): 0   Number of mixed cystic and solid nodules >/= 1.5 cm not described below (TR2): 0    _________________________________________________________   Nodule # 1:   Location: Right; Mid   Maximum size: 0.6 cm   Composition: cannot determine (2)   Echogenicity: cannot determine (1)   Shape: not taller-than-wide (0)   Margins: smooth (0)   Echogenic foci: peripheral calcifications (2)   ACR TI-RADS total points: 5.   ACR TI-RADS risk category: TR4 (4-6 points).   ACR TI-RADS recommendations:   Given size (<0.9 cm) and appearance, this nodule does NOT meet TI-RADS criteria for biopsy or dedicated follow-up.   _________________________________________________________   IMPRESSION: 1. Enlarged thyroid gland. No suspicious thyroid nodules requiring further dedicated follow-up or biopsy.   The above is in keeping with the ACR TI-RADS recommendations - J Am Coll Radiol 2017;14:587-595.     Electronically Signed   By: Olive Bass M.D.   On: 02/01/2021 14:03 Assessment & Plan:   1. Thyroid nodule-incidental finding  - Isabella Holmes is being seen at a kind request of Royann Shivers, PA-C. - I have reviewed her available records and clinically evaluated the patient.  During a recent hospitalization for CHF exacerbation an incidental finding of thyroid nodule was found during CTA of her chest.  Will order dedicated thyroid imaging with thyroid ultrasound prior to next visit to rule out thyroids involvement in her symptoms of shortness of breath and difficulty swallowing.  She also has other specialist appointments coming up to rule out lung vs cardiac involvement.   Her thyroid ultrasound shows a heterogeneous, mildly enlarged thyroid with 1 nodule which is not suspicious for malignancy.  She does not need any intervention or dedicated follow up at this time.  The size of her thyroid does not appear to be big enough to cause her intermittent swallowing difficulty.  2. Hypothyroidism- acquired  Her thyroid antibody testing was negative, ruling out  autoimmune thyroid dysfunction such as Hashimotos.  Her TFTs are consistent with appropriate hormone replacement.  She is advised to continue her current dose of Levothyroxine 50 mcg po daily before breakfast.   - The correct intake of thyroid hormone (Levothyroxine, Synthroid), is on empty stomach first thing in the morning, with water, separated by at least 30 minutes from breakfast and other medications,  and separated by more than 4 hours from calcium, iron, multivitamins, acid reflux medications (PPIs).  - This medication is a life-long medication and will be needed to correct thyroid hormone imbalances for the rest of your life.  The dose may change from time to time, based on thyroid blood work.  - It is extremely important to be consistent taking this medication, near the same time each morning.  -AVOID TAKING PRODUCTS CONTAINING BIOTIN (commonly found in Hair, Skin, Nails vitamins) AS IT INTERFERES WITH THE VALIDITY OF THYROID FUNCTION BLOOD TESTS.       - she is advised to maintain close follow up with Royann Shivers, PA-C for primary care needs.  She can return to having her PCP manage her hypothyroidism at this time unless she prefers to have follow up here.    I spent 30 minutes in the care of the patient today including review of labs from Thyroid Function, CMP, and other relevant labs ; imaging/biopsy records (current and previous including abstractions from other facilities); face-to-face time discussing  her lab results and symptoms, medications doses, her options of short and long term treatment based on  the latest standards of care / guidelines;   and documenting the encounter.  Edgardo Roys  participated in the discussions, expressed understanding, and voiced agreement with the above plans.  All questions were answered to her satisfaction. she is encouraged to contact clinic should she have any questions or concerns prior to her return visit.  Follow up  plan: Return can return to PCP for management of her thyroid unless she prefers here.   Ronny Bacon, [facility] [address] [place], [place] [address] Phone: [phone] Fax: [phone]   02/09/2021, 3:03 PM

## 2021-02-10 ENCOUNTER — Encounter: Payer: Self-pay | Admitting: Cardiology

## 2021-02-10 LAB — IGE: IgE (Immunoglobulin E), Serum: 10 IU/mL (ref 6–495)

## 2021-02-14 ENCOUNTER — Ambulatory Visit: Payer: Medicare Other | Admitting: Cardiology

## 2021-02-25 NOTE — Progress Notes (Signed)
CARDIOLOGY CONSULT NOTE       Patient ID: Isabella Holmes MRN: [id number] DOB/AGE: [birth year] 75 y.o.  Admit date: (Not on file) Referring Physician: Vernice Jefferson Primary Physician: Royann Shivers, PA-C Primary Cardiologist: New Reason for Consultation: CHF  Active Problems:   * No active hospital problems. *   HPI:  75 y.o. referred by PA [facility] for CHF. History of COPD, HLD, Asthma, venous insufficiency , Seizures, OSA and Migraines Diagnosis not clear. She had seen pulmonary with 2 months of worsening dyspnea not responsive to antibiotics and prednisone Seen at [facility] and told BNP elevated but echo with normal EF Seen latter in month of October with COPD exacerbation Has had COVID in March 2020 and January 2022 01/14/21 Pulmonary ordered PFTls and started Trelegy   Reviewed echo from [facility] 12/19/20 EF 65-70% only grade one diastolic relaxation abnormality Normal RV No valve dx Her Pro BNP was only 391 ( > 450 pg/ml abnormal for patients > 73 yo )  D/C with PRN lasix K/Cr normal Also noted thyroid nodule with normal TSH/T4 [place] 02/01/21 no suspicious nodules   Seen in ED again yesterday with wheezing and dyspnea BNP essentially normal at 106 r/o CXR with no overt CHF. Rx with nebs and solumedrol Has f/u appointment with [facility] 03/17/20   ROS All other systems reviewed and negative except as noted above  Past Medical History:  Diagnosis Date   Arthritis    Asthma    Chest pain    w abnormal ECg   CHF (congestive heart failure) (HCC)    Chronic headaches    migraine   COPD (chronic obstructive pulmonary disease) (HCC)    Dyslipidemia    Dyspnea    Dyspnea    Glucose intolerance (impaired glucose tolerance)    Hemorrhoids 03/25/2015   Hypothyroidism    Seizure disorder (HCC)    on Lamictal; has not had seizure in 15 years   Seizures (HCC)    Sleep apnea    lost weight and does not need CPAP any more.   Varicose veins 03/25/2015   Venous insufficiency      Family History  Problem Relation Age of Onset   Heart attack Mother    Tuberculosis Father    Heart disease Father        cardiac disease   Cirrhosis Father    Factor V Leiden deficiency Other    Seizures Son     Social History   Socioeconomic History   Marital status: Married    Spouse name: Not on file   Number of children: Not on file   Years of education: Not on file   Highest education level: Not on file  Occupational History   Not on file  Tobacco Use   Smoking status: Former    Packs/day: 2.50    Years: 20.00    Pack years: 50.00    Types: Cigarettes    Quit date: 06/23/1986    Years since quitting: 34.7   Smokeless tobacco: Never  Vaping Use   Vaping Use: Never used  Substance and Sexual Activity   Alcohol use: No   Drug use: No   Sexual activity: Yes    Birth control/protection: Post-menopausal  Other Topics Concern   Not on file  Social History Narrative   Full Time, works at [employer].    Social Determinants of Health   Financial Resource Strain: Not on file  Food Insecurity: Not on file  Transportation Needs: Not on  file  Physical Activity: Not on file  Stress: Not on file  Social Connections: Not on file  Intimate Partner Violence: Not on file    Past Surgical History:  Procedure Laterality Date   EXAM UNDER ANESTHESIA WITH MANIPULATION OF KNEE Right 04/05/2017   Procedure: EXAM UNDER ANESTHESIA WITH MANIPULATION OF RIGHT KNEE;  Surgeon: Vickki Hearing, MD;  Location: [facility];  Service: Orthopedics;  Laterality: Right;   KNEE ARTHROSCOPY Left    KNEE ARTHROSCOPY WITH MEDIAL MENISECTOMY Right 06/28/2016   Procedure: KNEE ARTHROSCOPY WITH MEDIAL MENISECTOMY;  Surgeon: Vickki Hearing, MD;  Location: [facility];  Service: Orthopedics;  Laterality: Right;   left vein stripping     status post bilateral tubaligation     TONSILLECTOMY     many years ago   TOTAL KNEE ARTHROPLASTY Right 02/13/2017   Procedure: TOTAL KNEE ARTHROPLASTY;  Surgeon:  Vickki Hearing, MD;  Location: [facility];  Service: Orthopedics;  Laterality: Right;   TUBAL LIGATION        Current Outpatient Medications:    albuterol (PROVENTIL HFA;VENTOLIN HFA) 108 (90 Base) MCG/ACT inhaler, Inhale 1-2 puffs into the lungs every 4 (four) hours as needed for wheezing or shortness of breath., Disp: , Rfl:    albuterol (PROVENTIL) (2.5 MG/3ML) 0.083% nebulizer solution, Take 2.5 mg by nebulization every 6 (six) hours as needed., Disp: , Rfl:    aspirin EC 325 MG EC tablet, Take 1 tablet (325 mg total) by mouth daily with breakfast., Disp: 30 tablet, Rfl: 0   cetirizine (ZYRTEC) 10 MG tablet, Take 10 mg by mouth as needed for allergies., Disp: , Rfl:    Cholecalciferol (VITAMIN D3) 25 MCG (1000 UT) CAPS, Take 1 capsule by mouth daily., Disp: , Rfl:    docusate sodium (COLACE) 100 MG capsule, Take 1 capsule (100 mg total) by mouth 2 (two) times daily. (Patient taking differently: Take 100 mg by mouth as needed for mild constipation.), Disp: 10 capsule, Rfl: 0   famotidine (PEPCID) 20 MG tablet, Take 20 mg by mouth 2 (two) times daily., Disp: , Rfl:    FLUoxetine (PROZAC) 20 MG capsule, Take 20 mg by mouth daily., Disp: , Rfl:    Fluticasone-Umeclidin-Vilant (TRELEGY ELLIPTA) 200-62.5-25 MCG/ACT AEPB, Inhale 1 puff into the lungs daily., Disp: 1 each, Rfl: 0   furosemide (LASIX) 20 MG tablet, Take 1 tablet (20 mg total) by mouth daily., Disp: 30 tablet, Rfl: 0   IBU 600 MG tablet, Take 1 tablet (600 mg total) by mouth every 6 (six) hours as needed., Disp: 90 tablet, Rfl: 5   lamoTRIgine (LAMICTAL) 150 MG tablet, Take 150 mg by mouth at bedtime., Disp: , Rfl:    levothyroxine (SYNTHROID, LEVOTHROID) 50 MCG tablet, Take 50 mcg by mouth daily before breakfast. , Disp: , Rfl:    potassium chloride SA (KLOR-CON M) 20 MEQ tablet, Take 1 tablet (20 mEq total) by mouth daily., Disp: 30 tablet, Rfl: 0    Physical Exam: Blood pressure (!) 144/88, pulse 70, height 5\' 4"  (1.626 m),  weight 252 lb 8 oz (114.5 kg), SpO2 98 %.    Affect appropriate Chronically ill  HEENT: normal Neck prominent thyroid no formed nodules  JVP normal no bruits no thyromegaly Lungs rhonchi exp wheezes  Heart:  S1/S2 no murmur, no rub, gallop or click PMI normal Abdomen: benighn, BS positve, no tenderness, no AAA no bruit.  No HSM or HJR Distal pulses intact with no bruits No edema Neuro non-focal Skin  warm and dry No muscular weakness   Labs:   Lab Results  Component Value Date   WBC 7.1 03/08/2021   WBC 6.9 03/08/2021   HGB 12.9 03/08/2021   HGB 12.8 03/08/2021   HCT 40.3 03/08/2021   HCT 39.5 03/08/2021   MCV 93.3 03/08/2021   MCV 93.2 03/08/2021   PLT 199 03/08/2021   PLT 199 03/08/2021    Recent Labs  Lab 03/08/21 1234  NA 135  K 3.3*  CL 103  CO2 24  BUN 12  CREATININE 0.74  CALCIUM 9.0  GLUCOSE 109*   No results found for: CKTOTAL, CKMB, CKMBINDEX, TROPONINI No results found for: CHOL No results found for: HDL No results found for: LDLCALC No results found for: TRIG No results found for: CHOLHDL No results found for: LDLDIRECT    Radiology: DG Chest 2 View  Result Date: 03/08/2021 CLINICAL DATA:  Shortness of breath.  Productive cough.  Wheezing. EXAM: CHEST - 2 VIEW COMPARISON:  Two-view chest x-ray 01/02/2021 FINDINGS: Art size is within normal limits. Atherosclerotic changes are noted at the aortic arch. Mild pulmonary vascular congestion is now present without frank edema. No effusion is present. No focal airspace disease is present. Axial skeleton is unremarkable. IMPRESSION: Mild pulmonary vascular congestion without frank edema. Electronically Signed   By: Marin Roberts M.D.   On: 03/08/2021 12:56    EKG: SB rate 58 normal 01/03/21    ASSESSMENT AND PLAN:   HFpEF:  TTE normal and no elevated filling pressures on diastolic parameters Minimally elevated BNP Primary issue appears to be COPD/Asthma.  Need to r/o CAD given age and prevoius  smoking given body habitus not a good candidate for cardiac CTA will try to risk stratify with lexiscan myovue Discussed fact that the lexiscan may make her flushed and dypsnic for a minute but should be safe  COPD:  f/u Dr Marchelle Gearing PFT;s pending Trelegy started prevoius COVID x 2 Her recurrent wheezing and dyspnea seem more pulmonary related. CTA done at [facility] 01/06/21 was negative for PE  Thyroid:  f/u endocrine [place] benign and TSH/T4 normal    Lexiscan myovue F/U 6-8 weeks   F/U PRN  Signed: Charlton Haws 03/09/2021, 2:02 PM

## 2021-03-08 ENCOUNTER — Emergency Department (HOSPITAL_COMMUNITY): Payer: Medicare Other

## 2021-03-08 ENCOUNTER — Emergency Department (HOSPITAL_COMMUNITY)
Admission: EM | Admit: 2021-03-08 | Discharge: 2021-03-08 | Disposition: A | Discharge status: Home / Self Care | Payer: Medicare Other | Attending: Emergency Medicine | Admitting: Emergency Medicine

## 2021-03-08 ENCOUNTER — Other Ambulatory Visit: Payer: Self-pay

## 2021-03-08 ENCOUNTER — Encounter (HOSPITAL_COMMUNITY): Payer: Self-pay

## 2021-03-08 DIAGNOSIS — J449 Chronic obstructive pulmonary disease, unspecified: Secondary | ICD-10-CM | POA: Diagnosis not present

## 2021-03-08 DIAGNOSIS — R062 Wheezing: Secondary | ICD-10-CM | POA: Diagnosis not present

## 2021-03-08 DIAGNOSIS — Z7951 Long term (current) use of inhaled steroids: Secondary | ICD-10-CM | POA: Diagnosis not present

## 2021-03-08 DIAGNOSIS — Z87891 Personal history of nicotine dependence: Secondary | ICD-10-CM | POA: Diagnosis not present

## 2021-03-08 DIAGNOSIS — R6889 Other general symptoms and signs: Secondary | ICD-10-CM | POA: Diagnosis not present

## 2021-03-08 DIAGNOSIS — R0981 Nasal congestion: Secondary | ICD-10-CM | POA: Diagnosis not present

## 2021-03-08 DIAGNOSIS — J45909 Unspecified asthma, uncomplicated: Secondary | ICD-10-CM | POA: Insufficient documentation

## 2021-03-08 DIAGNOSIS — Z96651 Presence of right artificial knee joint: Secondary | ICD-10-CM | POA: Diagnosis not present

## 2021-03-08 DIAGNOSIS — E876 Hypokalemia: Secondary | ICD-10-CM | POA: Insufficient documentation

## 2021-03-08 DIAGNOSIS — Z79899 Other long term (current) drug therapy: Secondary | ICD-10-CM | POA: Insufficient documentation

## 2021-03-08 DIAGNOSIS — Z20822 Contact with and (suspected) exposure to covid-19: Secondary | ICD-10-CM | POA: Diagnosis not present

## 2021-03-08 DIAGNOSIS — J45901 Unspecified asthma with (acute) exacerbation: Secondary | ICD-10-CM | POA: Diagnosis not present

## 2021-03-08 DIAGNOSIS — J811 Chronic pulmonary edema: Secondary | ICD-10-CM | POA: Insufficient documentation

## 2021-03-08 DIAGNOSIS — Z7982 Long term (current) use of aspirin: Secondary | ICD-10-CM | POA: Insufficient documentation

## 2021-03-08 DIAGNOSIS — I509 Heart failure, unspecified: Secondary | ICD-10-CM | POA: Insufficient documentation

## 2021-03-08 DIAGNOSIS — R059 Cough, unspecified: Secondary | ICD-10-CM | POA: Diagnosis not present

## 2021-03-08 DIAGNOSIS — E039 Hypothyroidism, unspecified: Secondary | ICD-10-CM | POA: Insufficient documentation

## 2021-03-08 DIAGNOSIS — R0989 Other specified symptoms and signs involving the circulatory and respiratory systems: Secondary | ICD-10-CM

## 2021-03-08 DIAGNOSIS — I110 Hypertensive heart disease with heart failure: Secondary | ICD-10-CM | POA: Insufficient documentation

## 2021-03-08 DIAGNOSIS — R0602 Shortness of breath: Secondary | ICD-10-CM | POA: Diagnosis not present

## 2021-03-08 LAB — CBC
HCT: 40.3 % (ref 36.0–46.0)
Hemoglobin: 12.9 g/dL (ref 12.0–15.0)
MCH: 29.9 pg (ref 26.0–34.0)
MCHC: 32 g/dL (ref 30.0–36.0)
MCV: 93.3 fL (ref 80.0–100.0)
Platelets: 199 10*3/uL (ref 150–400)
RBC: 4.32 MIL/uL (ref 3.87–5.11)
RDW: 12.8 % (ref 11.5–15.5)
WBC: 7.1 10*3/uL (ref 4.0–10.5)
nRBC: 0 % (ref 0.0–0.2)

## 2021-03-08 LAB — BASIC METABOLIC PANEL
Anion gap: 8 (ref 5–15)
BUN: 12 mg/dL (ref 8–23)
CO2: 24 mmol/L (ref 22–32)
Calcium: 9 mg/dL (ref 8.9–10.3)
Chloride: 103 mmol/L (ref 98–111)
Creatinine, Ser: 0.74 mg/dL (ref 0.44–1.00)
GFR, Estimated: >60 mL/min (ref >60)
Glucose, Bld: 109 mg/dL — ABNORMAL HIGH (ref 70–99)
Potassium: 3.3 mmol/L — ABNORMAL LOW (ref 3.5–5.1)
Sodium: 135 mmol/L (ref 135–145)

## 2021-03-08 LAB — CBC WITH DIFFERENTIAL/PLATELET
Abs Immature Granulocytes: 0.01 10*3/uL (ref 0.00–0.07)
Basophils Absolute: 0 10*3/uL (ref 0.0–0.1)
Basophils Relative: 1 %
Eosinophils Absolute: 0 10*3/uL (ref 0.0–0.5)
Eosinophils Relative: 1 %
HCT: 39.5 % (ref 36.0–46.0)
Hemoglobin: 12.8 g/dL (ref 12.0–15.0)
Immature Granulocytes: 0 %
Lymphocytes Relative: 21 %
Lymphs Abs: 1.4 10*3/uL (ref 0.7–4.0)
MCH: 30.2 pg (ref 26.0–34.0)
MCHC: 32.4 g/dL (ref 30.0–36.0)
MCV: 93.2 fL (ref 80.0–100.0)
Monocytes Absolute: 0.6 10*3/uL (ref 0.1–1.0)
Monocytes Relative: 9 %
Neutro Abs: 4.8 10*3/uL (ref 1.7–7.7)
Neutrophils Relative %: 68 %
Platelets: 199 10*3/uL (ref 150–400)
RBC: 4.24 MIL/uL (ref 3.87–5.11)
RDW: 13 % (ref 11.5–15.5)
WBC: 6.9 10*3/uL (ref 4.0–10.5)
nRBC: 0 % (ref 0.0–0.2)

## 2021-03-08 LAB — RESP PANEL BY RT-PCR (FLU A&B, COVID) ARPGX2
Influenza A by PCR: NEGATIVE
Influenza B by PCR: NEGATIVE
SARS Coronavirus 2 by RT PCR: NEGATIVE

## 2021-03-08 LAB — BRAIN NATRIURETIC PEPTIDE: B Natriuretic Peptide: 106 pg/mL — ABNORMAL HIGH (ref 0.0–100.0)

## 2021-03-08 LAB — MAGNESIUM: Magnesium: 2 mg/dL (ref 1.7–2.4)

## 2021-03-08 MED ORDER — POTASSIUM CHLORIDE CRYS ER 20 MEQ PO TBCR: 20.0000 meq | EXTENDED_RELEASE_TABLET | Freq: Every day | ORAL | 0 refills | Status: DC

## 2021-03-08 MED ORDER — ALBUTEROL (5 MG/ML) CONTINUOUS INHALATION SOLN
10.0000 mg/h | INHALATION_SOLUTION | Freq: Once | RESPIRATORY_TRACT | Status: AC
  Administered 2021-03-08: 13:00:00 10 mg/h via RESPIRATORY_TRACT

## 2021-03-08 MED ORDER — FUROSEMIDE 10 MG/ML IJ SOLN
40.0000 mg | Freq: Once | INTRAMUSCULAR | Status: AC
  Administered 2021-03-08: 13:00:00 40 mg via INTRAVENOUS
  Filled 2021-03-08: qty 4

## 2021-03-08 MED ORDER — METHYLPREDNISOLONE SODIUM SUCC 125 MG IJ SOLR
125.0000 mg | Freq: Once | INTRAMUSCULAR | Status: AC
  Administered 2021-03-08: 13:00:00 125 mg via INTRAVENOUS
  Filled 2021-03-08: qty 2

## 2021-03-08 MED ORDER — POTASSIUM CHLORIDE CRYS ER 20 MEQ PO TBCR
40.0000 meq | EXTENDED_RELEASE_TABLET | Freq: Once | ORAL | Status: AC
  Administered 2021-03-08: 16:00:00 40 meq via ORAL
  Filled 2021-03-08: qty 2

## 2021-03-08 MED ORDER — FUROSEMIDE 20 MG PO TABS: 20.0000 mg | ORAL_TABLET | Freq: Every day | ORAL | 0 refills | Status: DC

## 2021-03-08 MED ORDER — ALBUTEROL SULFATE (2.5 MG/3ML) 0.083% IN NEBU
INHALATION_SOLUTION | RESPIRATORY_TRACT | Status: AC
  Filled 2021-03-08: qty 12

## 2021-03-08 NOTE — ED Notes (Signed)
Pt d/c home per MD order. Discharge summary reviewed with pt, pt verbalizes understanding. Ambulatory off unit. No s/s of acute distress noted, discharged home with visitor.

## 2021-03-08 NOTE — ED Triage Notes (Addendum)
Patient complaining of shortness of breath since the previous day. Seen by primary care this morning and told to come to the ED.

## 2021-03-08 NOTE — ED Provider Notes (Signed)
[facility] EMERGENCY DEPARTMENT Provider Note   CSN: [id number] Arrival date & time: 03/08/21  1149     History Chief Complaint  Patient presents with   Shortness of Isabella Holmes is a 75 y.o. female.  HPI She complains of waxing and waning symptoms of shortness of breath, with cough and sputum production for several months.  She has been evaluated several times for same.  She has an appointment tomorrow with cardiology for further evaluation.  She has had both COVID and influenza vaccines.  She is using home albuterol both inhaler and nebulizer without significant relief.  She denies chest pain, weakness or dizziness.  She is here with her daughter.  She saw her PCP this morning who stated that she needed to be assessed further in the emergency department because of her symptom recurrence.  There are no other known active modifying factors.    Past Medical History:  Diagnosis Date   Arthritis    Asthma    Chest pain    w abnormal ECg   CHF (congestive heart failure) (HCC)    Chronic headaches    migraine   COPD (chronic obstructive pulmonary disease) (HCC)    Dyslipidemia    Dyspnea    Dyspnea    Glucose intolerance (impaired glucose tolerance)    Hemorrhoids 03/25/2015   Hypothyroidism    Seizure disorder ([place])    on Lamictal; has not had seizure in 15 years   Seizures ([place])    Sleep apnea    lost weight and does not need CPAP any more.   Varicose veins 03/25/2015   Venous insufficiency     Patient Active Problem List   Diagnosis Date Noted   Arthrofibrosis of total knee replacement right ([place]) s/p Manipulation 04/05/17    S/P total knee replacement, right 02/13/17 02/27/2017   Primary osteoarthritis of one knee, right 02/13/2017   Encounter for screening colonoscopy 01/31/2017   Derangement of posterior horn of medial meniscus of right knee    Varicose veins 03/25/2015   Hemorrhoids 03/25/2015   Vulvar abscess 02/12/2015   OVERWEIGHT 01/06/2009    ANXIETY STATE, UNSPECIFIED 01/06/2009   HYPERTENSION 01/06/2009   ESOPHAGEAL REFLUX 01/06/2009   SEIZURE DISORDER 01/06/2009   HYPERSOMNIA WITH SLEEP APNEA UNSPECIFIED 01/06/2009   UNSPECIFIED SLEEP APNEA 01/06/2009   OTHER CHEST PAIN 01/06/2009    Past Surgical History:  Procedure Laterality Date   EXAM UNDER ANESTHESIA WITH MANIPULATION OF KNEE Right 04/05/2017   Procedure: EXAM UNDER ANESTHESIA WITH MANIPULATION OF RIGHT KNEE;  Surgeon: Carole Civil, MD;  Location: [facility];  Service: Orthopedics;  Laterality: Right;   KNEE ARTHROSCOPY Left    KNEE ARTHROSCOPY WITH MEDIAL MENISECTOMY Right 06/28/2016   Procedure: KNEE ARTHROSCOPY WITH MEDIAL MENISECTOMY;  Surgeon: Carole Civil, MD;  Location: [facility];  Service: Orthopedics;  Laterality: Right;   left vein stripping     status post bilateral tubaligation     TONSILLECTOMY     many years ago   TOTAL KNEE ARTHROPLASTY Right 02/13/2017   Procedure: TOTAL KNEE ARTHROPLASTY;  Surgeon: Carole Civil, MD;  Location: [facility];  Service: Orthopedics;  Laterality: Right;   TUBAL LIGATION       OB History     Gravida  2   Para  2   Term      Preterm      AB      Living  2      SAB  IAB      Ectopic      Multiple      Live Births              Family History  Problem Relation Age of Onset   Heart attack Mother    Tuberculosis Father    Heart disease Father        cardiac disease   Cirrhosis Father    Factor V Leiden deficiency Other    Seizures Son     Social History   Tobacco Use   Smoking status: Former    Packs/day: 2.50    Years: 20.00    Pack years: 50.00    Types: Cigarettes    Quit date: 06/23/1986    Years since quitting: 34.7   Smokeless tobacco: Never  Vaping Use   Vaping Use: Never used  Substance Use Topics   Alcohol use: No   Drug use: No    Home Medications Prior to Admission medications   Medication Sig Start Date End Date Taking? Authorizing Provider  albuterol  (PROVENTIL HFA;VENTOLIN HFA) 108 (90 Base) MCG/ACT inhaler Inhale 1-2 puffs into the lungs every 4 (four) hours as needed for wheezing or shortness of breath.   Yes [provider]  albuterol (PROVENTIL) (2.5 MG/3ML) 0.083% nebulizer solution Take 2.5 mg by nebulization every 6 (six) hours as needed. 11/29/20  Yes [provider]  cetirizine (ZYRTEC) 10 MG tablet Take 10 mg by mouth as needed for allergies.   Yes [provider]  Cholecalciferol (VITAMIN D3) 25 MCG (1000 UT) CAPS Take 1 capsule by mouth daily.   Yes [provider]  docusate sodium (COLACE) 100 MG capsule Take 1 capsule (100 mg total) by mouth 2 (two) times daily. Patient taking differently: Take 100 mg by mouth as needed for mild constipation. 02/15/17  Yes Carole Civil, MD  famotidine (PEPCID) 20 MG tablet Take 20 mg by mouth 2 (two) times daily. 11/22/20  Yes [provider]  FLUoxetine (PROZAC) 20 MG capsule Take 20 mg by mouth daily. 11/23/20  Yes [provider]  furosemide (LASIX) 20 MG tablet Take 1 tablet (20 mg total) by mouth daily. 03/08/21  Yes Daleen Bo, MD  IBU 600 MG tablet Take 1 tablet (600 mg total) by mouth every 6 (six) hours as needed. 03/21/17  Yes Carole Civil, MD  lamoTRIgine (LAMICTAL) 150 MG tablet Take 150 mg by mouth at bedtime.   Yes [provider]  levothyroxine (SYNTHROID, LEVOTHROID) 50 MCG tablet Take 50 mcg by mouth daily before breakfast.  05/19/16  Yes [provider]  potassium chloride SA (KLOR-CON M) 20 MEQ tablet Take 1 tablet (20 mEq total) by mouth daily. 03/08/21  Yes Daleen Bo, MD  aspirin EC 325 MG EC tablet Take 1 tablet (325 mg total) by mouth daily with breakfast. Patient not taking: Reported on 03/08/2021 02/16/17   Carole Civil, MD  Fluticasone-Umeclidin-Vilant (TRELEGY ELLIPTA) 200-62.5-25 MCG/ACT AEPB Inhale 1 puff into the lungs daily. Patient not taking: Reported on 03/08/2021 01/14/21    Marshell Garfinkel, MD    Allergies    Penicillins, Levofloxacin, Streptomycin, Sulfa antibiotics, and Sulfonamide derivatives  Review of Systems   Review of Systems  All other systems reviewed and are negative.  Physical Exam Updated Vital Signs BP 134/62    Pulse 75    Temp 98 F (36.7 C) (Oral)    Resp 16    SpO2 94%   Physical Exam Vitals and  nursing note reviewed.  Constitutional:      General: She is not in acute distress.    Appearance: She is well-developed. She is not ill-appearing or diaphoretic.  HENT:     Head: Normocephalic and atraumatic.     Right Ear: External ear normal.     Left Ear: External ear normal.     Nose: No congestion.  Eyes:     Conjunctiva/sclera: Conjunctivae normal.     Pupils: Pupils are equal, round, and reactive to light.  Neck:     Trachea: Phonation normal.  Cardiovascular:     Rate and Rhythm: Normal rate and regular rhythm.     Heart sounds: Normal heart sounds.  Pulmonary:     Effort: Pulmonary effort is normal. No respiratory distress.     Breath sounds: No stridor.     Comments: Decreased air movement bilaterally, with a few scattered rhonchi and wheezes.  There is no increased work of breathing. Abdominal:     General: There is no distension.     Palpations: Abdomen is soft.     Tenderness: There is no abdominal tenderness.  Musculoskeletal:        General: No swelling or tenderness. Normal range of motion.     Cervical back: Normal range of motion and neck supple.     Right lower leg: No edema.     Left lower leg: No edema.  Skin:    General: Skin is warm and dry.  Neurological:     Mental Status: She is alert and oriented to person, place, and time.     Cranial Nerves: No cranial nerve deficit.     Sensory: No sensory deficit.     Motor: No abnormal muscle tone.     Coordination: Coordination normal.  Psychiatric:        Behavior: Behavior normal.        Thought Content: Thought content normal.        Judgment:  Judgment normal.    ED Results / Procedures / Treatments   Labs (all labs ordered are listed, but only abnormal results are displayed) Labs Reviewed  BASIC METABOLIC PANEL - Abnormal; Notable for the following components:      Result Value   Potassium 3.3 (*)    Glucose, Bld 109 (*)    All other components within normal limits  BRAIN NATRIURETIC PEPTIDE - Abnormal; Notable for the following components:   B Natriuretic Peptide 106.0 (*)    All other components within normal limits  RESP PANEL BY RT-PCR (FLU A&B, COVID) ARPGX2  CBC  CBC WITH DIFFERENTIAL/PLATELET  MAGNESIUM    EKG EKG Interpretation  Date/Time:  Tuesday March 08 2021 12:35:46 EST Ventricular Rate:  69 PR Interval:  168 QRS Duration: 82 QT Interval:  394 QTC Calculation: 422 R Axis:   -24 Text Interpretation: Normal sinus rhythm Cannot rule out Anterior infarct , age undetermined Abnormal ECG since last tracing no significant change Confirmed by Daleen Bo [id number]) on 03/08/2021 1:55:08 PM  Radiology DG Chest 2 View  Result Date: 03/08/2021 CLINICAL DATA:  Shortness of breath.  Productive cough.  Wheezing. EXAM: CHEST - 2 VIEW COMPARISON:  Two-view chest x-ray 01/02/2021 FINDINGS: Art size is within normal limits. Atherosclerotic changes are noted at the aortic arch. Mild pulmonary vascular congestion is now present without frank edema. No effusion is present. No focal airspace disease is present. Axial skeleton is unremarkable. IMPRESSION: Mild pulmonary vascular congestion without frank edema. Electronically Signed   By:  San Morelle M.D.   On: 03/08/2021 12:56    Procedures .Critical Care Performed by: Daleen Bo, MD Authorized by: Daleen Bo, MD   Critical care provider statement:    Critical care time (minutes):  35   Critical care start time:  03/08/2021 12:49 PM   Critical care end time:  03/08/2021 3:30 PM   Critical care time was exclusive of:  Separately billable procedures  and treating other patients   Critical care was time spent personally by me on the following activities:  Blood draw for specimens, development of treatment plan with patient or surrogate, discussions with consultants, evaluation of patient's response to treatment, examination of patient, ordering and performing treatments and interventions, ordering and review of laboratory studies, ordering and review of radiographic studies, pulse oximetry, re-evaluation of patient's condition and review of old charts   Medications Ordered in ED Medications  albuterol (PROVENTIL) (2.5 MG/3ML) 0.083% nebulizer solution (  Not Given 03/08/21 1324)  potassium chloride SA (KLOR-CON M) CR tablet 40 mEq (has no administration in time range)  albuterol (PROVENTIL,VENTOLIN) solution continuous neb (10 mg/hr Nebulization Given 03/08/21 1323)  methylPREDNISolone sodium succinate (SOLU-MEDROL) 125 mg/2 mL injection 125 mg (125 mg Intravenous Given 03/08/21 1316)  furosemide (LASIX) injection 40 mg (40 mg Intravenous Given 03/08/21 1319)    ED Course  I have reviewed the triage vital signs and the nursing notes.  Pertinent labs & imaging results that were available during my care of the patient were reviewed by me and considered in my medical decision making (see chart for details).    MDM Rules/Calculators/A&P                           Patient Vitals for the past 24 hrs:  BP Temp Temp src Pulse Resp SpO2  03/08/21 1517 134/62 -- -- 75 16 94 %  03/08/21 1415 (!) 154/76 -- -- 81 (!) 21 96 %  03/08/21 1302 (!) 140/59 -- -- 72 19 96 %  03/08/21 1232 (!) 147/84 98 F (36.7 C) Oral 76 18 97 %    3:20 PM Reevaluation with update and discussion. After initial assessment and treatment, an updated evaluation reveals she states that she has urinated 4 times, and feels much better now.  Findings discussed and questions answered. Daleen Bo   Medical Decision Making:  This patient is presenting for evaluation of  cough, sputum production, shortness of breath, which does require a range of treatment options, and is a complaint that involves a moderate risk of morbidity and mortality. The differential diagnoses include bronchitis, viral illness, congestive heart failure. I decided to review old records, and in summary elderly female, being managed as an outpatient with plans for cardiology follow-up tomorrow, has ongoing cough and shortness of breath.  I obtained additional historical information from daughter at bedside.  Clinical Laboratory Tests Ordered, included CBC, Metabolic panel, and BMP, viral panel . Review indicates normal except potassium low, glucose high, BMP slightly elevated. Radiologic Tests Ordered, included chest x-ray.  I independently Visualized: Radiograph images, which show pulmonary congestion without pulmonary edema.  Cardiac Monitor Tracing which shows normal sinus rhythm   Critical Interventions-clinical evaluation, laboratory testing, radiography, medication treatment, observation and reassessment  After These Interventions, the Patient was reevaluated and was found with nonspecific symptoms, likely multifactorial.  Chest x-ray with pulmonary congestion, no prior history of heart failure no prior cardiac echo available for review.  Patient with history of COPD.  Doubt that this represents a COPD exacerbation.  Mild hypokalemia present she was treated with potassium orally in the ED and will be given a prescription for potassium with Lasix at discharge.  She has a follow-up appointment tomorrow with cardiology in the afternoon.  She is to start taking the prescriptions tomorrow morning.  Doubt acute MI.  Stable mild elevation of troponin.  CRITICAL CARE-yes Performed by: Daleen Bo  Nursing Notes Reviewed/ Care Coordinated Applicable Imaging Reviewed Interpretation of Laboratory Data incorporated into ED treatment  The patient appears reasonably screened and/or stabilized  for discharge and I doubt any other medical condition or other [facility] requiring further screening, evaluation, or treatment in the ED at this time prior to discharge.  Plan: Home Medications-continue usual; Home Treatments-low-salt diet; return here if the recommended treatment, does not improve the symptoms; Recommended follow up-cardiology tomorrow as scheduled       Final Clinical Impression(s) / ED Diagnoses Final diagnoses:  Pulmonary vascular congestion  Hypokalemia    Rx / DC Orders ED Discharge Orders          Ordered    furosemide (LASIX) 20 MG tablet  Daily        03/08/21 1529    potassium chloride SA (KLOR-CON M) 20 MEQ tablet  Daily        03/08/21 1529             Daleen Bo, MD 03/09/21 1140

## 2021-03-08 NOTE — Discharge Instructions (Signed)
The testing today indicates that you have too much fluid in your lungs.  We are prescribing a diuretic and potassium to help control that symptom.  See Dr. Johnsie Cancel, tomorrow as scheduled.  Try to stay on a low-salt diet.

## 2021-03-09 ENCOUNTER — Encounter: Payer: Self-pay | Admitting: Cardiovascular Disease

## 2021-03-09 ENCOUNTER — Ambulatory Visit: Payer: Medicare Other | Admitting: Cardiovascular Disease

## 2021-03-09 VITALS — BP 144/88 | HR 70 | Ht 64.0 in | Wt 252.5 lb

## 2021-03-09 DIAGNOSIS — R0609 Other forms of dyspnea: Secondary | ICD-10-CM

## 2021-03-09 DIAGNOSIS — R0789 Other chest pain: Secondary | ICD-10-CM

## 2021-03-09 DIAGNOSIS — I5032 Chronic diastolic (congestive) heart failure: Secondary | ICD-10-CM

## 2021-03-09 DIAGNOSIS — R079 Chest pain, unspecified: Secondary | ICD-10-CM

## 2021-03-09 NOTE — Patient Instructions (Signed)
Medication Instructions:  Your physician recommends that you continue on your current medications as directed. Please refer to the Current Medication list given to you today.  *If you need a refill on your cardiac medications before your next appointment, please call your pharmacy*   Lab Work: None If you have labs (blood work) drawn today and your tests are completely normal, you will receive your results only by: [place] (if you have MyChart) OR A paper copy in the mail If you have any lab test that is abnormal or we need to change your treatment, we will call you to review the results.   Testing/Procedures: Your physician has requested that you have a lexiscan myoview. For further information please visit [email or web address]. Please follow instruction sheet, as given.    Follow-Up: At [facility], you and your health needs are our priority.  As part of our continuing mission to provide you with exceptional heart care, we have created designated Provider Care Teams.  These Care Teams include your primary Cardiologist (physician) and Advanced Practice Providers (APPs -  Physician Assistants and Nurse Practitioners) who all work together to provide you with the care you need, when you need it.  We recommend signing up for the patient portal called "MyChart".  Sign up information is provided on this After Visit Summary.  MyChart is used to connect with patients for Virtual Visits (Telemedicine).  Patients are able to view lab/test results, encounter notes, upcoming appointments, etc.  Non-urgent messages can be sent to your provider as well.   To learn more about what you can do with MyChart, go to [email or web address].    Your next appointment:   6-8 week(s)  The format for your next appointment:   In Person  Provider:   Jenkins Rouge, MD    Other Instructions

## 2021-03-10 NOTE — ED Provider Notes (Incomplete)
[facility] EMERGENCY DEPARTMENT Provider Note   CSN: [id number] Arrival date & time: 03/08/21  1149     History Chief Complaint  Patient presents with   Shortness of Isabella Holmes is a 75 y.o. female.  HPI ***Test    Past Medical History:  Diagnosis Date   Arthritis    Asthma    Chest pain    w abnormal ECg   CHF (congestive heart failure) (HCC)    Chronic headaches    migraine   COPD (chronic obstructive pulmonary disease) (HCC)    Dyslipidemia    Dyspnea    Dyspnea    Glucose intolerance (impaired glucose tolerance)    Hemorrhoids 03/25/2015   Hypothyroidism    Seizure disorder ([place])    on Lamictal; has not had seizure in 15 years   Seizures ([place])    Sleep apnea    lost weight and does not need CPAP any more.   Varicose veins 03/25/2015   Venous insufficiency     Patient Active Problem List   Diagnosis Date Noted   Arthrofibrosis of total knee replacement right ([place]) s/p Manipulation 04/05/17    S/P total knee replacement, right 02/13/17 02/27/2017   Primary osteoarthritis of one knee, right 02/13/2017   Encounter for screening colonoscopy 01/31/2017   Derangement of posterior horn of medial meniscus of right knee    Varicose veins 03/25/2015   Hemorrhoids 03/25/2015   Vulvar abscess 02/12/2015   OVERWEIGHT 01/06/2009   ANXIETY STATE, UNSPECIFIED 01/06/2009   HYPERTENSION 01/06/2009   ESOPHAGEAL REFLUX 01/06/2009   SEIZURE DISORDER 01/06/2009   HYPERSOMNIA WITH SLEEP APNEA UNSPECIFIED 01/06/2009   UNSPECIFIED SLEEP APNEA 01/06/2009   OTHER CHEST PAIN 01/06/2009    Past Surgical History:  Procedure Laterality Date   EXAM UNDER ANESTHESIA WITH MANIPULATION OF KNEE Right 04/05/2017   Procedure: EXAM UNDER ANESTHESIA WITH MANIPULATION OF RIGHT KNEE;  Surgeon: Carole Civil, MD;  Location: [facility];  Service: Orthopedics;  Laterality: Right;   KNEE ARTHROSCOPY Left    KNEE ARTHROSCOPY WITH MEDIAL MENISECTOMY Right 06/28/2016    Procedure: KNEE ARTHROSCOPY WITH MEDIAL MENISECTOMY;  Surgeon: Carole Civil, MD;  Location: [facility];  Service: Orthopedics;  Laterality: Right;   left vein stripping     status post bilateral tubaligation     TONSILLECTOMY     many years ago   TOTAL KNEE ARTHROPLASTY Right 02/13/2017   Procedure: TOTAL KNEE ARTHROPLASTY;  Surgeon: Carole Civil, MD;  Location: [facility];  Service: Orthopedics;  Laterality: Right;   TUBAL LIGATION       OB History     Gravida  2   Para  2   Term      Preterm      AB      Living  2      SAB      IAB      Ectopic      Multiple      Live Births              Family History  Problem Relation Age of Onset   Heart attack Mother    Tuberculosis Father    Heart disease Father        cardiac disease   Cirrhosis Father    Factor V Leiden deficiency Other    Seizures Son     Social History   Tobacco Use   Smoking status: Former    Packs/day: 2.50  Years: 20.00    Pack years: 50.00    Types: Cigarettes    Quit date: 06/23/1986    Years since quitting: 34.7   Smokeless tobacco: Never  Vaping Use   Vaping Use: Never used  Substance Use Topics   Alcohol use: No   Drug use: No    Home Medications Prior to Admission medications   Medication Sig Start Date End Date Taking? Authorizing Provider  albuterol (PROVENTIL HFA;VENTOLIN HFA) 108 (90 Base) MCG/ACT inhaler Inhale 1-2 puffs into the lungs every 4 (four) hours as needed for wheezing or shortness of breath.   Yes [provider]  albuterol (PROVENTIL) (2.5 MG/3ML) 0.083% nebulizer solution Take 2.5 mg by nebulization every 6 (six) hours as needed. 11/29/20  Yes [provider]  cetirizine (ZYRTEC) 10 MG tablet Take 10 mg by mouth as needed for allergies.   Yes [provider]  Cholecalciferol (VITAMIN D3) 25 MCG (1000 UT) CAPS Take 1 capsule by mouth daily.   Yes [provider]  docusate sodium (COLACE) 100 MG capsule Take 1  capsule (100 mg total) by mouth 2 (two) times daily. Patient taking differently: Take 100 mg by mouth as needed for mild constipation. 02/15/17  Yes Carole Civil, MD  famotidine (PEPCID) 20 MG tablet Take 20 mg by mouth 2 (two) times daily. 11/22/20  Yes [provider]  FLUoxetine (PROZAC) 20 MG capsule Take 20 mg by mouth daily. 11/23/20  Yes [provider]  furosemide (LASIX) 20 MG tablet Take 1 tablet (20 mg total) by mouth daily. 03/08/21  Yes Daleen Bo, MD  IBU 600 MG tablet Take 1 tablet (600 mg total) by mouth every 6 (six) hours as needed. 03/21/17  Yes Carole Civil, MD  lamoTRIgine (LAMICTAL) 150 MG tablet Take 150 mg by mouth at bedtime.   Yes [provider]  levothyroxine (SYNTHROID, LEVOTHROID) 50 MCG tablet Take 50 mcg by mouth daily before breakfast.  05/19/16  Yes [provider]  potassium chloride SA (KLOR-CON M) 20 MEQ tablet Take 1 tablet (20 mEq total) by mouth daily. 03/08/21  Yes Daleen Bo, MD  aspirin EC 325 MG EC tablet Take 1 tablet (325 mg total) by mouth daily with breakfast. 02/16/17   Carole Civil, MD  Fluticasone-Umeclidin-Vilant (TRELEGY ELLIPTA) 200-62.5-25 MCG/ACT AEPB Inhale 1 puff into the lungs daily. 01/14/21   Marshell Garfinkel, MD    Allergies    Penicillins, Levofloxacin, Streptomycin, Sulfa antibiotics, and Sulfonamide derivatives  Review of Systems   Review of Systems  Physical Exam Updated Vital Signs BP 134/62    Pulse 75    Temp 98 F (36.7 C) (Oral)    Resp 16    SpO2 94%   Physical Exam  ED Results / Procedures / Treatments   Labs (all labs ordered are listed, but only abnormal results are displayed) Labs Reviewed  BASIC METABOLIC PANEL - Abnormal; Notable for the following components:      Result Value   Potassium 3.3 (*)    Glucose, Bld 109 (*)    All other components within normal limits  BRAIN NATRIURETIC PEPTIDE - Abnormal; Notable for the following components:   B  Natriuretic Peptide 106.0 (*)    All other components within normal limits  RESP PANEL BY RT-PCR (FLU A&B, COVID) ARPGX2  CBC  CBC WITH DIFFERENTIAL/PLATELET  MAGNESIUM    EKG EKG Interpretation  Date/Time:  Tuesday March 08 2021 12:35:46 EST Ventricular Rate:  69 PR Interval:  168 QRS Duration: 82 QT Interval:  394 QTC Calculation: 422 R Axis:   -24 Text Interpretation: Normal sinus rhythm Cannot rule out Anterior infarct , age undetermined Abnormal ECG since last tracing no significant change Confirmed by Daleen Bo [phone]) on 03/08/2021 1:55:08 PM  Radiology No results found.  Procedures Procedures   Medications Ordered in ED Medications  albuterol (PROVENTIL,VENTOLIN) solution continuous neb (10 mg/hr Nebulization Given 03/08/21 1323)  methylPREDNISolone sodium succinate (SOLU-MEDROL) 125 mg/2 mL injection 125 mg (125 mg Intravenous Given 03/08/21 1316)  furosemide (LASIX) injection 40 mg (40 mg Intravenous Given 03/08/21 1319)  potassium chloride SA (KLOR-CON M) CR tablet 40 mEq (40 mEq Oral Given 03/08/21 1537)    ED Course  I have reviewed the triage vital signs and the nursing notes.  Pertinent labs & imaging results that were available during my care of the patient were reviewed by me and considered in my medical decision making (see chart for details).    MDM Rules/Calculators/A&P                          No data found.  1:42 PM Reevaluation with update and discussion with the patient and her daughter who was in the room with her. After initial assessment and treatment, an updated evaluation reveals she is comfortable, indicating that she has voided several times, more than urinal full.  Her daughter is in agreement that she feels better. Illness risk, decompensated heart failure leading to worsening respiratory distress, discussed. Daleen Bo    Medical Decision Making:   Critical Interventions-clinical evaluation, laboratory testing, radiography,  assessment of urinary output, EKG and cardiac monitor; has been completed to evaluate for complications of a new problem with uncertain prognosis.  After These Interventions, the Patient was reevaluated and was found ***  This patient is presenting for evaluation of ***, which {Range:23949} require a range of treatment options, and {MDMcomplaint:23950} a complaint that involves a {MDMlevelrisk:23951} risk of morbidity and mortality. The differential diagnoses include***. I decided to review old records, and in summary ***.  I *** additional historical information from ***, as the patient is ***.  Clinical Laboratory Tests Ordered, included {MDMtests:23952}. Review indicates ***. Emergent testing abnormality management required for stabilization *** Radiologic Tests Ordered, included ***.  I independently Visualized: *** Images, which show ***   Cardiac Monitor Tracing which shows ***  Specimen of *** indicating ***. I ordered and Reviewed the *** Medicine Test.  1:42 PM-Consult complete with ***. Patient case explained and discussed. Requirement for *** agreed on. Possible Risk of  ***. *** agrees to *** patient for further evaluation and treatment. Call ended at ***   CRITICAL CARE- *** Performed by: Daleen Bo  ***    {Remember to document critical care time when appropriate:1}   Final Clinical Impression(s) / ED Diagnoses Final diagnoses:  Pulmonary vascular congestion  Hypokalemia    Rx / DC Orders ED Discharge Orders          Ordered    furosemide (LASIX) 20 MG tablet  Daily        03/08/21 1529    potassium chloride SA (KLOR-CON M) 20 MEQ tablet  Daily,   Status:  Discontinued        03/08/21 1529    potassium chloride SA (KLOR-CON M) 20 MEQ tablet  Daily        03/08/21 1535

## 2021-03-13 ENCOUNTER — Other Ambulatory Visit: Payer: Self-pay

## 2021-03-13 ENCOUNTER — Encounter (HOSPITAL_COMMUNITY): Payer: Self-pay | Admitting: *Deleted

## 2021-03-13 ENCOUNTER — Emergency Department (HOSPITAL_COMMUNITY)
Admission: EM | Admit: 2021-03-13 | Discharge: 2021-03-13 | Disposition: A | Discharge status: Home / Self Care | Payer: PPO | Attending: Emergency Medicine | Admitting: Emergency Medicine

## 2021-03-13 ENCOUNTER — Emergency Department (HOSPITAL_COMMUNITY): Payer: PPO

## 2021-03-13 DIAGNOSIS — Z79899 Other long term (current) drug therapy: Secondary | ICD-10-CM | POA: Diagnosis not present

## 2021-03-13 DIAGNOSIS — J4540 Moderate persistent asthma, uncomplicated: Secondary | ICD-10-CM | POA: Insufficient documentation

## 2021-03-13 DIAGNOSIS — R053 Chronic cough: Secondary | ICD-10-CM

## 2021-03-13 DIAGNOSIS — R0602 Shortness of breath: Secondary | ICD-10-CM | POA: Diagnosis present

## 2021-03-13 LAB — COMPREHENSIVE METABOLIC PANEL
ALT: 23 U/L (ref 0–44)
AST: 27 U/L (ref 15–41)
Albumin: 3.8 g/dL (ref 3.5–5.0)
Alkaline Phosphatase: 137 U/L — ABNORMAL HIGH (ref 38–126)
Anion gap: 9 (ref 5–15)
BUN: 13 mg/dL (ref 8–23)
CO2: 29 mmol/L (ref 22–32)
Calcium: 9.6 mg/dL (ref 8.9–10.3)
Chloride: 100 mmol/L (ref 98–111)
Creatinine, Ser: 0.8 mg/dL (ref 0.44–1.00)
GFR, Estimated: >60 mL/min (ref >60)
Glucose, Bld: 123 mg/dL — ABNORMAL HIGH (ref 70–99)
Potassium: 3.8 mmol/L (ref 3.5–5.1)
Sodium: 138 mmol/L (ref 135–145)
Total Bilirubin: 0.8 mg/dL (ref 0.3–1.2)
Total Protein: 6.9 g/dL (ref 6.5–8.1)

## 2021-03-13 LAB — CBC WITH DIFFERENTIAL/PLATELET
Abs Immature Granulocytes: 0.02 10*3/uL (ref 0.00–0.07)
Basophils Absolute: 0.1 10*3/uL (ref 0.0–0.1)
Basophils Relative: 1 %
Eosinophils Absolute: 0.5 10*3/uL (ref 0.0–0.5)
Eosinophils Relative: 7 %
HCT: 43.7 % (ref 36.0–46.0)
Hemoglobin: 13.9 g/dL (ref 12.0–15.0)
Immature Granulocytes: 0 %
Lymphocytes Relative: 25 %
Lymphs Abs: 1.5 10*3/uL (ref 0.7–4.0)
MCH: 29.3 pg (ref 26.0–34.0)
MCHC: 31.8 g/dL (ref 30.0–36.0)
MCV: 92.2 fL (ref 80.0–100.0)
Monocytes Absolute: 0.6 10*3/uL (ref 0.1–1.0)
Monocytes Relative: 10 %
Neutro Abs: 3.6 10*3/uL (ref 1.7–7.7)
Neutrophils Relative %: 57 %
Platelets: 252 10*3/uL (ref 150–400)
RBC: 4.74 MIL/uL (ref 3.87–5.11)
RDW: 12.8 % (ref 11.5–15.5)
WBC: 6.2 10*3/uL (ref 4.0–10.5)
nRBC: 0 % (ref 0.0–0.2)

## 2021-03-13 LAB — URINALYSIS, ROUTINE W REFLEX MICROSCOPIC
Bacteria, UA: NONE SEEN
Bilirubin Urine: NEGATIVE
Glucose, UA: NEGATIVE mg/dL
Ketones, ur: NEGATIVE mg/dL
Leukocytes,Ua: NEGATIVE
Nitrite: NEGATIVE
Protein, ur: NEGATIVE mg/dL
Specific Gravity, Urine: 1.01 (ref 1.005–1.030)
pH: 5 (ref 5.0–8.0)

## 2021-03-13 LAB — TROPONIN I (HIGH SENSITIVITY): Troponin I (High Sensitivity): 5 ng/L (ref <18)

## 2021-03-13 MED ORDER — IPRATROPIUM-ALBUTEROL 0.5-2.5 (3) MG/3ML IN SOLN
3.0000 mL | Freq: Once | RESPIRATORY_TRACT | Status: AC
  Administered 2021-03-13: 3 mL via RESPIRATORY_TRACT
  Filled 2021-03-13: qty 3

## 2021-03-13 MED ORDER — DOXYCYCLINE HYCLATE 100 MG PO TABS
100.0000 mg | ORAL_TABLET | Freq: Once | ORAL | Status: AC
  Administered 2021-03-13: 100 mg via ORAL
  Filled 2021-03-13: qty 1

## 2021-03-13 MED ORDER — DOXYCYCLINE HYCLATE 100 MG PO CAPS: 100.0000 mg | ORAL_CAPSULE | Freq: Two times a day (BID) | ORAL | 0 refills | Status: AC

## 2021-03-13 NOTE — Discharge Instructions (Addendum)
Follow-up with your pulmonologist at your scheduled appointment to finish your pulmonary function testing.  Your x-ray today did not show any signs of pneumonia however given the length of time that you have had a cough with a yellow phlegm I have prescribed a medicine called doxycycline to take twice a day  Continue your treatment with albuterol and Trelegy, return to the ER for severe or worsening symptoms

## 2021-03-13 NOTE — ED Provider Notes (Signed)
[place] Provider Note   CSN: [id number] Arrival date & time: 03/13/21  1342     History  Chief Complaint  Patient presents with   Shortness of [place] is a 76 y.o. female.   Shortness of Breath  76 year old female, she has a medical history significant for some shortness of breath over the last couple of months, she has been seen by multiple doctors including her family doctor, pulmonology, cardiology and multiple ER visits.  Throughout that process she has been found to have a BNP of 106, she has been told that her thyroid was unremarkable, she has been tested for COVID several times including most recently on the 27th which was negative as well as flu was negative.  Because of increasing wheezing and ongoing coughing she presents today concerned that her symptoms are just not getting any better.  Over the last 2 to 3 weeks she has had increasing cough which is productive of yellow sputum.  She does not have any fevers or chills.  She also complains of having some urinary symptoms including dysuria and a urine that smells like ammonia.  She has a follow-up with pulmonology this week, she is currently taking Trelegy and a nebulizer for albuterol.  She had stopped smoking 40 years ago, is thought to possibly have asthma.  Home Medications Prior to Admission medications   Medication Sig Start Date End Date Taking? Authorizing Provider  doxycycline (VIBRAMYCIN) 100 MG capsule Take 1 capsule (100 mg total) by mouth 2 (two) times daily for 7 days. 03/13/21 03/20/21 Yes Noemi Chapel, MD  albuterol (PROVENTIL HFA;VENTOLIN HFA) 108 (90 Base) MCG/ACT inhaler Inhale 1-2 puffs into the lungs every 4 (four) hours as needed for wheezing or shortness of breath.    [provider]  albuterol (PROVENTIL) (2.5 MG/3ML) 0.083% nebulizer solution Take 2.5 mg by nebulization every 6 (six) hours as needed. 11/29/20   [provider]  aspirin EC 325 MG  EC tablet Take 1 tablet (325 mg total) by mouth daily with breakfast. 02/16/17   Carole Civil, MD  cetirizine (ZYRTEC) 10 MG tablet Take 10 mg by mouth as needed for allergies.    [provider]  Cholecalciferol (VITAMIN D3) 25 MCG (1000 UT) CAPS Take 1 capsule by mouth daily.    [provider]  docusate sodium (COLACE) 100 MG capsule Take 1 capsule (100 mg total) by mouth 2 (two) times daily. Patient taking differently: Take 100 mg by mouth as needed for mild constipation. 02/15/17   Carole Civil, MD  famotidine (PEPCID) 20 MG tablet Take 20 mg by mouth 2 (two) times daily. 11/22/20   [provider]  FLUoxetine (PROZAC) 20 MG capsule Take 20 mg by mouth daily. 11/23/20   [provider]  Fluticasone-Umeclidin-Vilant (TRELEGY ELLIPTA) 200-62.5-25 MCG/ACT AEPB Inhale 1 puff into the lungs daily. 01/14/21   Mannam, Hart Robinsons, MD  furosemide (LASIX) 20 MG tablet Take 1 tablet (20 mg total) by mouth daily. 03/08/21   Daleen Bo, MD  IBU 600 MG tablet Take 1 tablet (600 mg total) by mouth every 6 (six) hours as needed. 03/21/17   Carole Civil, MD  lamoTRIgine (LAMICTAL) 150 MG tablet Take 150 mg by mouth at bedtime.    [provider]  levothyroxine (SYNTHROID, LEVOTHROID) 50 MCG tablet Take 50 mcg by mouth daily before breakfast.  05/19/16   [provider]  potassium chloride SA (KLOR-CON M) 20 MEQ tablet  Take 1 tablet (20 mEq total) by mouth daily. 03/08/21   Daleen Bo, MD      Allergies    Penicillins, Levofloxacin, Streptomycin, Sulfa antibiotics, and Sulfonamide derivatives    Review of Systems   Review of Systems  Respiratory:  Positive for shortness of breath.   All other systems reviewed and are negative.  Physical Exam Updated Vital Signs BP (!) 142/78    Pulse 65    Temp 98.5 F (36.9 C) (Oral)    Resp 14    Ht 1.626 m (5\' 4" )    Wt 111.1 kg    SpO2 96%    BMI 42.05 kg/m  Physical Exam Vitals and nursing  note reviewed.  Constitutional:      General: She is not in acute distress.    Appearance: She is well-developed.  HENT:     Head: Normocephalic and atraumatic.     Mouth/Throat:     Pharynx: No oropharyngeal exudate.  Eyes:     General: No scleral icterus.       Right eye: No discharge.        Left eye: No discharge.     Conjunctiva/sclera: Conjunctivae normal.     Pupils: Pupils are equal, round, and reactive to light.  Neck:     Thyroid: No thyromegaly.     Vascular: No JVD.  Cardiovascular:     Rate and Rhythm: Normal rate and regular rhythm.     Heart sounds: Normal heart sounds. No murmur heard.   No friction rub. No gallop.  Pulmonary:     Effort: Pulmonary effort is normal. No respiratory distress.     Breath sounds: Wheezing present. No rales.     Comments: Minimal end expiratory wheezing, no prolonged expiratory phase, no tachypnea, no increased work of breathing, no accessory muscle use Abdominal:     General: Bowel sounds are normal. There is no distension.     Palpations: Abdomen is soft. There is no mass.     Tenderness: There is no abdominal tenderness.  Musculoskeletal:        General: No tenderness. Normal range of motion.     Cervical back: Normal range of motion and neck supple.  Lymphadenopathy:     Cervical: No cervical adenopathy.  Skin:    General: Skin is warm and dry.     Findings: No erythema or rash.  Neurological:     Mental Status: She is alert.     Coordination: Coordination normal.  Psychiatric:        Behavior: Behavior normal.    ED Results / Procedures / Treatments   Labs (all labs ordered are listed, but only abnormal results are displayed) Labs Reviewed  COMPREHENSIVE METABOLIC PANEL - Abnormal; Notable for the following components:      Result Value   Glucose, Bld 123 (*)    Alkaline Phosphatase 137 (*)    All other components within normal limits  URINALYSIS, ROUTINE W REFLEX MICROSCOPIC - Abnormal; Notable for the following  components:   Hgb urine dipstick SMALL (*)    All other components within normal limits  CBC WITH DIFFERENTIAL/PLATELET  TROPONIN I (HIGH SENSITIVITY)  TROPONIN I (HIGH SENSITIVITY)    EKG None  Radiology DG Chest 2 View  Result Date: 03/13/2021 CLINICAL DATA:  Shortness of breath.  Cough and congestion. EXAM: CHEST - 2 VIEW COMPARISON:  Two-view chest x-ray 03/08/2021 FINDINGS: The heart size is normal. Atherosclerotic changes are again noted at the aortic arch. Aeration  of both lungs is slightly improved. No edema or effusion is present. No focal airspace disease is present. Degenerative changes of the thoracic spine are stable. IMPRESSION: 1. No acute cardiopulmonary disease or significant interval change. 2. Aortic atherosclerosis. Electronically Signed   By: San Morelle M.D.   On: 03/13/2021 15:00    Procedures Procedures    Medications Ordered in ED Medications  ipratropium-albuterol (DUONEB) 0.5-2.5 (3) MG/3ML nebulizer solution 3 mL (has no administration in time range)  doxycycline (VIBRA-TABS) tablet 100 mg (has no administration in time range)    ED Course/ Medical Decision Making/ A&P                           Medical Decision Making  This patient presents to the ED for concern of shortness of breath and ongoing wheezing, this involves an extensive number of treatment options, and is a complaint that carries with it a high risk of complications and morbidity.  The differential diagnosis includes reactive airway disease, pneumonia, pneumothorax, less likely pulmonary embolism or congestive heart failure given the recent lab testing.   Co morbidities that complicate the patient evaluation  Obesity, some form of reactive airway disease   Additional history obtained:  Additional history obtained from electronic medical record External records from outside source obtained and reviewed including prior chest x-rays, negative for pneumonia, cardiology visit from  earlier in the month on the 28th, noted that the patient has a history of heart failure with a preserved ejection fraction.  Referred to pulmonary for pulmonary function testing, had a [facility] done at [facility] on October 27 which was negative for pulmonary embolism.  Is supposed to be getting a Lexi scan.   Lab Tests:  I Ordered, and personally interpreted labs.  The pertinent results include: No leukocytosis, unremarkable metabolic panel   Imaging Studies ordered:  I ordered imaging studies including portable chest x-ray I independently visualized and interpreted imaging which showed no signs of acute infiltrate I agree with the radiologist interpretation   Cardiac Monitoring:  The patient was maintained on a cardiac monitor.  I personally viewed and interpreted the cardiac monitored which showed an underlying rhythm of: Normal sinus rhythm without ischemia or arrhythmia   Medicines ordered and prescription drug management:  I ordered medication including DuoNeb and doxycycline for persistent cough and reactive airway disease Reevaluation of the patient after these medicines showed that the patient improved I have reviewed the patients home medicines and have made adjustments as needed   Test Considered:  CT scan though I think this is less likely to be helpful given that she does not have tachycardia or hypoxia   Critical Interventions:  DuoNeb Doxycycline Work-up for more severe and serious causes of cardiac disease     Problem List / ED Course:  Improved with nebulizer, given doxycycline, stable for discharge, has follow-up with pulmonology   Reevaluation:  After the interventions noted above, I reevaluated the patient and found that they have :improved   Social Determinants of Health:  None   Dispostion:  After consideration of the diagnostic results and the patients response to treatment, I feel that the patent would benefit from discharge home.            Final Clinical Impression(s) / ED Diagnoses Final diagnoses:  Moderate persistent reactive airway disease without complication  Cough, persistent    Rx / DC Orders ED Discharge Orders  Ordered    doxycycline (VIBRAMYCIN) 100 MG capsule  2 times daily        03/13/21 1701              Noemi Chapel, MD 03/13/21 [phone]

## 2021-03-13 NOTE — ED Provider Notes (Signed)
Emergency Medicine Provider Triage Evaluation Note  Isabella Holmes , a 76 y.o. female  was evaluated in triage.  Pt complains of shortness of breath.  Pt reports increased shortness of breath for 2 months  Pt complains of coughing all night. Pt has seen cardiology, Endocrinology and pulmonologist.  Pt also reports uti symptoms   Review of Systems  Positive: cough Negative: fever  Physical Exam  BP 136/78 (BP Location: Left Arm)    Pulse 87    Temp 98.5 F (36.9 C) (Oral)    Resp 20    Ht 5\' 4"  (1.626 m)    Wt 111.1 kg    SpO2 95%    BMI 42.05 kg/m  Gen:   Awake, no distress   Resp:  Normal effort  MSK:   Moves extremities without difficulty  Other:    Medical Decision Making  Medically screening exam initiated at 3:30 PM.  Appropriate orders placed.  Isabella Holmes was informed that the remainder of the evaluation will be completed by another provider, this initial triage assessment does not replace that evaluation, and the importance of remaining in the ED until their evaluation is complete.     Sidney Ace 03/13/21 1532    Noemi Chapel, MD 03/13/21 [phone]

## 2021-03-13 NOTE — ED Triage Notes (Signed)
Pt c/o SOB, cough, dysuria and foul smelling urine x 3 days.

## 2021-03-16 ENCOUNTER — Other Ambulatory Visit: Payer: Self-pay | Admitting: Pulmonary Disease

## 2021-03-16 DIAGNOSIS — R0609 Other forms of dyspnea: Secondary | ICD-10-CM

## 2021-03-17 ENCOUNTER — Other Ambulatory Visit: Payer: Self-pay

## 2021-03-17 ENCOUNTER — Ambulatory Visit (INDEPENDENT_AMBULATORY_CARE_PROVIDER_SITE_OTHER): Payer: PPO | Admitting: Pulmonary Disease

## 2021-03-17 ENCOUNTER — Ambulatory Visit: Payer: PPO | Admitting: Pulmonary Disease

## 2021-03-17 ENCOUNTER — Encounter: Payer: Self-pay | Admitting: Pulmonary Disease

## 2021-03-17 VITALS — BP 146/74 | HR 74 | Temp 98.0°F | Ht 64.5 in | Wt 249.6 lb

## 2021-03-17 DIAGNOSIS — R0609 Other forms of dyspnea: Secondary | ICD-10-CM

## 2021-03-17 LAB — PULMONARY FUNCTION TEST
DL/VA % pred: 117 %
DL/VA: 4.8 ml/min/mmHg/L
DLCO cor % pred: 116 %
DLCO cor: 22.88 ml/min/mmHg
DLCO unc % pred: 118 %
DLCO unc: 23.23 ml/min/mmHg
FEF 25-75 Post: 1.82 L/sec
FEF 25-75 Pre: 1.46 L/sec
FEF2575-%Change-Post: 25 %
FEF2575-%Pred-Post: 102 %
FEF2575-%Pred-Pre: 81 %
FEV1-%Change-Post: 5 %
FEV1-%Pred-Post: 90 %
FEV1-%Pred-Pre: 85 %
FEV1-Post: 2.01 L
FEV1-Pre: 1.91 L
FEV1FVC-%Change-Post: 2 %
FEV1FVC-%Pred-Pre: 100 %
FEV6-%Change-Post: 2 %
FEV6-%Pred-Post: 91 %
FEV6-%Pred-Pre: 89 %
FEV6-Post: 2.58 L
FEV6-Pre: 2.51 L
FEV6FVC-%Change-Post: 0 %
FEV6FVC-%Pred-Post: 104 %
FEV6FVC-%Pred-Pre: 104 %
FVC-%Change-Post: 2 %
FVC-%Pred-Post: 87 %
FVC-%Pred-Pre: 85 %
FVC-Post: 2.58 L
FVC-Pre: 2.52 L
Post FEV1/FVC ratio: 78 %
Post FEV6/FVC ratio: 100 %
Pre FEV1/FVC ratio: 76 %
Pre FEV6/FVC Ratio: 100 %
RV % pred: 121 %
RV: 2.77 L
TLC % pred: 104 %
TLC: 5.38 L

## 2021-03-17 MED ORDER — TRELEGY ELLIPTA 200-62.5-25 MCG/ACT IN AEPB: 1.0000 | INHALATION_SPRAY | Freq: Every day | RESPIRATORY_TRACT | 0 refills | Status: DC

## 2021-03-17 MED ORDER — TRELEGY ELLIPTA 200-62.5-25 MCG/ACT IN AEPB: 1.0000 | INHALATION_SPRAY | Freq: Every day | RESPIRATORY_TRACT | 2 refills | Status: DC

## 2021-03-17 NOTE — Addendum Note (Signed)
Addended by: Elton Sin on: 03/17/2021 05:36 PM   Modules accepted: Orders

## 2021-03-17 NOTE — Patient Instructions (Signed)
I have reviewed your lung function test which shows normal lung function which is good news I do believe that your shortness of breath is from a combination of mild asthma and fluid buildup from the heart Continue the Lasix We will start you on Trelegy inhaler Work on weight loss with diet and exercise Follow-up in 3 months

## 2021-03-17 NOTE — Progress Notes (Signed)
PFT done today. 

## 2021-03-17 NOTE — Progress Notes (Signed)
Isabella Holmes    [id number]    1945/07/09  Primary Care Physician:Skillman, Helane Rima, PA-C  Referring Physician: Royann Shivers, PA-C [address] [place],  [place] [address]  Chief complaint: Follow-up for dyspnea, wheezing   HPI: 76 year old with history of asthma, COPD, sleep apnea  Complains of worsening dyspnea, wheezing for the past 2 months.  She was given antibiotics and prednisone as an outpatient which did not help  Admitted to [facility] in early October 2022 for shortness of breath.  Diagnosed with CHF, CT and examination showed mild bilateral opacities, negative PE.  She had elevated BNP.  Started on Lasix and recommended outpatient cardiology follow-up. Echocardiogram showed normal LV function  She was evaluated in [facility] ED on October 23 with persistent shortness of breath.  This time it is felt to be vesicular secondary to her asthma and COPD and referred to pulmonary clinic  She has history of childhood asthma and was told she has COPD but no PFTs on record Is just on albuterol as needed.  She is on GERD and is using Pepcid twice daily with good control of symptoms  Had COVID twice in March 2020 and January 2022.  Did not require hospitalizations and recovered well from both episodes  Pets: No pets Occupation: Used to work in [employer], Lawyer.  Currently retired Exposures: No mold, hot tub, Jacuzzi.  No feather pillows or comforter Smoking history: 50-pack-year smoker.  Quit in 1982 Travel history: No significant travel history Relevant family history: No family history of lung disease  Interim history: She ran out of trelegy in late December.  She subsequently had 2 ED visits for dyspnea, wheezing and chest tightness with chest x-ray showing pulmonary congestion.  She was given Lasix and prednisone with improvement in symptoms.  On second visit she was given doxycycline  She has followed up with cardiology who has ordered cardiac  stress test  Continues to have dyspnea on exertion with nonproductive cough.  Denies any fevers  Outpatient Encounter Medications as of 03/17/2021  Medication Sig   albuterol (PROVENTIL HFA;VENTOLIN HFA) 108 (90 Base) MCG/ACT inhaler Inhale 1-2 puffs into the lungs every 4 (four) hours as needed for wheezing or shortness of breath.   albuterol (PROVENTIL) (2.5 MG/3ML) 0.083% nebulizer solution Take 2.5 mg by nebulization every 6 (six) hours as needed.   aspirin EC 325 MG EC tablet Take 1 tablet (325 mg total) by mouth daily with breakfast.   cetirizine (ZYRTEC) 10 MG tablet Take 10 mg by mouth as needed for allergies.   Cholecalciferol (VITAMIN D3) 25 MCG (1000 UT) CAPS Take 1 capsule by mouth daily.   docusate sodium (COLACE) 100 MG capsule Take 1 capsule (100 mg total) by mouth 2 (two) times daily. (Patient taking differently: Take 100 mg by mouth as needed for mild constipation.)   doxycycline (VIBRAMYCIN) 100 MG capsule Take 1 capsule (100 mg total) by mouth 2 (two) times daily for 7 days.   famotidine (PEPCID) 20 MG tablet Take 20 mg by mouth 2 (two) times daily.   FLUoxetine (PROZAC) 20 MG capsule Take 20 mg by mouth daily.   Fluticasone-Umeclidin-Vilant (TRELEGY ELLIPTA) 200-62.5-25 MCG/ACT AEPB Inhale 1 puff into the lungs daily.   furosemide (LASIX) 20 MG tablet Take 1 tablet (20 mg total) by mouth daily.   IBU 600 MG tablet Take 1 tablet (600 mg total) by mouth every 6 (six) hours as needed.   lamoTRIgine (  LAMICTAL) 150 MG tablet Take 150 mg by mouth at bedtime.   levothyroxine (SYNTHROID, LEVOTHROID) 50 MCG tablet Take 50 mcg by mouth daily before breakfast.    potassium chloride SA (KLOR-CON M) 20 MEQ tablet Take 1 tablet (20 mEq total) by mouth daily.   [DISCONTINUED] doxycycline (VIBRA-TABS) 100 MG tablet Take 100 mg by mouth 2 (two) times daily.   No facility-administered encounter medications on file as of 03/17/2021.   Physical Exam: Blood pressure (!) 146/74, pulse 74,  temperature 98 F (36.7 C), temperature source Oral, height 5' 4.5" (1.638 m), weight 249 lb 9.6 oz (113.2 kg), SpO2 97 %. Gen:      No acute distress HEENT:  EOMI, sclera anicteric Neck:     No masses; no thyromegaly Lungs:    Clear to auscultation bilaterally; normal respiratory effort CV:         Regular rate and rhythm; no murmurs Abd:      + bowel sounds; soft, non-tender; no palpable masses, no distension Ext:    No edema; adequate peripheral perfusion Skin:      Warm and dry; no rash Neuro: alert and oriented x 3 Psych: normal mood and affect   Data Reviewed: Imaging: CTA from [facility] 12/17/2020-no evidence of PE, mild cardiomegaly with mild interstitial edema, enlarged thyroid gland with calcifications.  Chest x-ray 03/08/2021-mild pulmonary vascular congestion Chest x-ray 03/13/2021-no acute cardiopulmonary abnormality I have reviewed the images personally.  PFTs: 03/17/2021 FVC 2.58 [87%), FEV1 2.01 [90%], F/F 78, TLC 5.38 [104%], DLCO 23.23 [118%] Normal test  Labs: CBC 05/23/2017-WBC 4.6, eos 4.3%, absolute eosinophil count 244  Cardiac: Echocardiogram 12/19/2020-LVEF 65-70%, normal RV size and function  Assessment:  Follow-up for dyspnea  Suspect her dyspnea is combination of asthma, HFpEF No evidence of COPD and PFTs which were normal  Has responded to Lasix and prednisone after recent ED visit and has cardiac stress test pending. She is also out of her inhaler and will renew the trelegy, continue Lasix  Advised weight loss with diet and exercise which will improve dyspnea  History of COVID-19 No clear evidence of COVID ILD.  No restriction or diffusion impairment on PFTs  Plan/Recommendations: Continue Trelegy, continue Lasix Weight loss with diet and exercise  Chilton Greathouse MD [facility] 03/17/2021, 2:19 PM  CC: Royann Shivers, *

## 2021-03-18 ENCOUNTER — Encounter (HOSPITAL_COMMUNITY): Payer: Medicare Other

## 2021-03-25 ENCOUNTER — Other Ambulatory Visit: Payer: Self-pay

## 2021-03-25 ENCOUNTER — Encounter (HOSPITAL_COMMUNITY): Payer: Self-pay

## 2021-03-25 ENCOUNTER — Encounter (HOSPITAL_COMMUNITY)
Admission: RE | Admit: 2021-03-25 | Discharge: 2021-03-25 | Disposition: A | Discharge status: Home / Self Care | Payer: PPO | Source: Ambulatory Visit | Attending: Cardiovascular Disease | Admitting: Cardiovascular Disease

## 2021-03-25 ENCOUNTER — Encounter (HOSPITAL_COMMUNITY): Payer: Medicare Other

## 2021-03-25 DIAGNOSIS — R0789 Other chest pain: Secondary | ICD-10-CM | POA: Diagnosis present

## 2021-03-25 DIAGNOSIS — R079 Chest pain, unspecified: Secondary | ICD-10-CM | POA: Diagnosis present

## 2021-03-25 LAB — NM MYOCAR MULTI W/SPECT W/WALL MOTION / EF
LV dias vol: 82 mL (ref 46–106)
LV sys vol: 24 mL
Nuc Stress EF: 71 %
RATE: 0.4
Rest Nuclear Isotope Dose: 11 mCi
SDS: 2
SRS: 2
SSS: 4
ST Depression (mm): 0 mm
Stress Nuclear Isotope Dose: 29.7 mCi
TID: 1.16

## 2021-03-25 MED ORDER — TECHNETIUM TC 99M TETROFOSMIN IV KIT
30.0000 | PACK | Freq: Once | INTRAVENOUS | Status: AC | PRN
  Administered 2021-03-25: 29.7 via INTRAVENOUS

## 2021-03-25 MED ORDER — TECHNETIUM TC 99M TETROFOSMIN IV KIT
10.0000 | PACK | Freq: Once | INTRAVENOUS | Status: AC | PRN
  Administered 2021-03-25: 11 via INTRAVENOUS

## 2021-03-25 MED ORDER — SODIUM CHLORIDE FLUSH 0.9 % IV SOLN
INTRAVENOUS | Status: AC
  Administered 2021-03-25: 10 mL via INTRAVENOUS
  Filled 2021-03-25: qty 10

## 2021-03-25 MED ORDER — REGADENOSON 0.4 MG/5ML IV SOLN
INTRAVENOUS | Status: AC
  Administered 2021-03-25: 0.4 mg via INTRAVENOUS
  Filled 2021-03-25: qty 5

## 2021-04-15 NOTE — Progress Notes (Incomplete)
CARDIOLOGY CONSULT NOTE       Patient ID: SONOMA THISSEN MRN: [id number] DOB/AGE: 1946-02-27 76 y.o.   Referring Physician: Vernice Jefferson Primary Physician: Sheela Stack Primary Cardiologist: New Reason for Consultation: CHF     HPI:  76 y.o. referred by PA [facility] for CHF. First seen 03/09/21 History of COPD, HLD, Asthma, venous insufficiency , Seizures, OSA and Migraines Diagnosis not clear. She had seen pulmonary with 2 months of worsening dyspnea not responsive to antibiotics and prednisone Seen at [facility] and told BNP elevated but echo with normal EF Seen latter in month of October with COPD exacerbation Has had COVID in March 2020 and January 2022 01/14/21 Pulmonary ordered PFTls and started Trelegy   Reviewed echo from [facility] 12/19/20 EF 65-70% only grade one diastolic relaxation abnormality Normal RV No valve dx Her Pro BNP was only 391 ( > 450 pg/ml abnormal for patients > 51 yo )  D/C with PRN lasix K/Cr normal Also noted thyroid nodule with normal TSH/T4 [place] 02/01/21 no suspicious nodules   Seen in ED again 03/08/21 with wheezing and dyspnea BNP essentially normal at 106 r/o CXR with no overt CHF. Rx with nebs and solumedrol   Seen by pulmonary 03/17/21 Dr Isaiah Serge. History of OSA/Asthma/COPD COVID x 13 May 2018 and January 2022 not hospitalized 50 packyear smoking history quit in 1982  Ran out of Trelegy December Refilled and advised weight loss diet/exercise   Myovue done 1/13/223 normal no perfusion defect EF 71%  PFTls normal no obstruction, response to dilator and normal DLCO  ***  ROS All other systems reviewed and negative except as noted above  Past Medical History:  Diagnosis Date   Arthritis    Asthma    Chest pain    w abnormal ECg   Chronic headaches    migraine   COPD (chronic obstructive pulmonary disease) (HCC)    Dyslipidemia    Dyspnea    Dyspnea    Glucose intolerance (impaired glucose tolerance)    Hemorrhoids  03/25/2015   Hypothyroidism    Seizure disorder (HCC)    on Lamictal; has not had seizure in 15 years   Seizures (HCC)    Sleep apnea    lost weight and does not need CPAP any more.   Varicose veins 03/25/2015   Venous insufficiency     Family History  Problem Relation Age of Onset   Heart attack Mother    Tuberculosis Father    Heart disease Father        cardiac disease   Cirrhosis Father    Factor V Leiden deficiency Other    Seizures Son     Social History   Socioeconomic History   Marital status: Married    Spouse name: Not on file   Number of children: Not on file   Years of education: Not on file   Highest education level: Not on file  Occupational History   Not on file  Tobacco Use   Smoking status: Former    Packs/day: 2.50    Years: 20.00    Pack years: 50.00    Types: Cigarettes    Quit date: 06/23/1986    Years since quitting: 34.8   Smokeless tobacco: Never  Vaping Use   Vaping Use: Never used  Substance and Sexual Activity   Alcohol use: No   Drug use: No   Sexual activity: Yes    Birth control/protection: Post-menopausal  Other Topics Concern   Not on  file  Social History Narrative   Full Time, works at [employer].    Social Determinants of Health   Financial Resource Strain: Not on file  Food Insecurity: Not on file  Transportation Needs: Not on file  Physical Activity: Not on file  Stress: Not on file  Social Connections: Not on file  Intimate Partner Violence: Not on file    Past Surgical History:  Procedure Laterality Date   EXAM UNDER ANESTHESIA WITH MANIPULATION OF KNEE Right 04/05/2017   Procedure: EXAM UNDER ANESTHESIA WITH MANIPULATION OF RIGHT KNEE;  Surgeon: Vickki Hearing, MD;  Location: [facility];  Service: Orthopedics;  Laterality: Right;   KNEE ARTHROSCOPY Left    KNEE ARTHROSCOPY WITH MEDIAL MENISECTOMY Right 06/28/2016   Procedure: KNEE ARTHROSCOPY WITH MEDIAL MENISECTOMY;  Surgeon: Vickki Hearing, MD;  Location: [facility];  Service: Orthopedics;  Laterality: Right;   left vein stripping     status post bilateral tubaligation     TONSILLECTOMY     many years ago   TOTAL KNEE ARTHROPLASTY Right 02/13/2017   Procedure: TOTAL KNEE ARTHROPLASTY;  Surgeon: Vickki Hearing, MD;  Location: [facility];  Service: Orthopedics;  Laterality: Right;   TUBAL LIGATION        Current Outpatient Medications:    albuterol (PROVENTIL HFA;VENTOLIN HFA) 108 (90 Base) MCG/ACT inhaler, Inhale 1-2 puffs into the lungs every 4 (four) hours as needed for wheezing or shortness of breath., Disp: , Rfl:    albuterol (PROVENTIL) (2.5 MG/3ML) 0.083% nebulizer solution, Take 2.5 mg by nebulization every 6 (six) hours as needed., Disp: , Rfl:    aspirin EC 325 MG EC tablet, Take 1 tablet (325 mg total) by mouth daily with breakfast., Disp: 30 tablet, Rfl: 0   cetirizine (ZYRTEC) 10 MG tablet, Take 10 mg by mouth as needed for allergies., Disp: , Rfl:    Cholecalciferol (VITAMIN D3) 25 MCG (1000 UT) CAPS, Take 1 capsule by mouth daily., Disp: , Rfl:    docusate sodium (COLACE) 100 MG capsule, Take 1 capsule (100 mg total) by mouth 2 (two) times daily. (Patient taking differently: Take 100 mg by mouth as needed for mild constipation.), Disp: 10 capsule, Rfl: 0   famotidine (PEPCID) 20 MG tablet, Take 20 mg by mouth 2 (two) times daily., Disp: , Rfl:    FLUoxetine (PROZAC) 20 MG capsule, Take 20 mg by mouth daily., Disp: , Rfl:    Fluticasone-Umeclidin-Vilant (TRELEGY ELLIPTA) 200-62.5-25 MCG/ACT AEPB, Inhale 1 puff into the lungs daily., Disp: 1 each, Rfl: 0   Fluticasone-Umeclidin-Vilant (TRELEGY ELLIPTA) 200-62.5-25 MCG/ACT AEPB, Inhale 1 puff into the lungs daily., Disp: 60 each, Rfl: 2   Fluticasone-Umeclidin-Vilant (TRELEGY ELLIPTA) 200-62.5-25 MCG/ACT AEPB, Inhale 1 puff into the lungs daily., Disp: 14 each, Rfl: 0   furosemide (LASIX) 20 MG tablet, Take 1 tablet (20 mg total) by mouth daily.,  Disp: 30 tablet, Rfl: 0   IBU 600 MG tablet, Take 1 tablet (600 mg total) by mouth every 6 (six) hours as needed., Disp: 90 tablet, Rfl: 5   lamoTRIgine (LAMICTAL) 150 MG tablet, Take 150 mg by mouth at bedtime., Disp: , Rfl:    levothyroxine (SYNTHROID, LEVOTHROID) 50 MCG tablet, Take 50 mcg by mouth daily before breakfast. , Disp: , Rfl:    potassium chloride SA (KLOR-CON M) 20 MEQ tablet, Take 1 tablet (20 mEq total) by mouth daily., Disp: 30 tablet, Rfl: 0    Physical Exam: There were no vitals taken for this visit.  Affect appropriate Chronically ill  HEENT: normal Neck prominent thyroid no formed nodules  JVP normal no bruits no thyromegaly Lungs rhonchi exp wheezes  Heart:  S1/S2 no murmur, no rub, gallop or click PMI normal Abdomen: benighn, BS positve, no tenderness, no AAA no bruit.  No HSM or HJR Distal pulses intact with no bruits No edema Neuro non-focal Skin warm and dry No muscular weakness   Labs:   Lab Results  Component Value Date   WBC 6.2 03/13/2021   HGB 13.9 03/13/2021   HCT 43.7 03/13/2021   MCV 92.2 03/13/2021   PLT 252 03/13/2021    No results for input(s): NA, K, CL, CO2, BUN, CREATININE, CALCIUM, PROT, BILITOT, ALKPHOS, ALT, AST, GLUCOSE in the last 168 hours.  Invalid input(s): LABALBU  No results found for: CKTOTAL, CKMB, CKMBINDEX, TROPONINI No results found for: CHOL No results found for: HDL No results found for: LDLCALC No results found for: TRIG No results found for: CHOLHDL No results found for: LDLDIRECT    Radiology: NM Myocar Multi W/Spect W/Wall Motion / EF  Result Date: 03/25/2021   Findings are consistent with no ischemia. The study is low risk.   No ST deviation was noted. The ECG was negative for ischemia.   LV perfusion is normal.  There is evidence of breast attenuation affecting the mid to basal inferolateral wall, no definitive ischemia.   Left ventricular function is normal. Nuclear stress EF: 71 %. Low risk  study with breast attenuation artifact and LVEF 71%.    EKG: SB rate 58 normal 01/03/21    ASSESSMENT AND PLAN:   HFpEF:  TTE normal and no elevated filling pressures on diastolic parameters Minimally elevated BNP Primary issue appears to be COPD/Asthma.  Myovue normal  COPD:  f/u Dr De Hollingshead PFTls normal Trelegy started prevoius COVID x 2 Her recurrent wheezing and dyspnea seem more pulmonary related. CTA done at [facility] 01/06/21 was negative for PE Will order high resolution non contrast CT  Thyroid:  f/u endocrine [place] benign and TSH/T4 normal    Non contrast high res CT lungs  F/U 6 months   F/U PRN  Signed: Charlton Haws 04/15/2021, 11:02 AM

## 2021-04-21 ENCOUNTER — Ambulatory Visit: Payer: Medicare Other | Admitting: Cardiovascular Disease

## 2021-05-09 ENCOUNTER — Ambulatory Visit (INDEPENDENT_AMBULATORY_CARE_PROVIDER_SITE_OTHER): Payer: PPO

## 2021-05-09 ENCOUNTER — Encounter: Payer: Self-pay | Admitting: Orthopedic Surgery

## 2021-05-09 ENCOUNTER — Ambulatory Visit (INDEPENDENT_AMBULATORY_CARE_PROVIDER_SITE_OTHER): Payer: PPO | Admitting: Orthopedic Surgery

## 2021-05-09 ENCOUNTER — Other Ambulatory Visit: Payer: Self-pay

## 2021-05-09 VITALS — BP 157/96 | HR 68 | Ht 66.0 in | Wt 253.0 lb

## 2021-05-09 DIAGNOSIS — M25562 Pain in left knee: Secondary | ICD-10-CM

## 2021-05-09 DIAGNOSIS — M1712 Unilateral primary osteoarthritis, left knee: Secondary | ICD-10-CM | POA: Diagnosis not present

## 2021-05-09 DIAGNOSIS — G8929 Other chronic pain: Secondary | ICD-10-CM | POA: Diagnosis not present

## 2021-05-09 DIAGNOSIS — Z6841 Body Mass Index (BMI) 40.0 and over, adult: Secondary | ICD-10-CM | POA: Diagnosis not present

## 2021-05-09 MED ORDER — MELOXICAM 7.5 MG PO TABS: 7.5000 mg | ORAL_TABLET | Freq: Every day | ORAL | 5 refills | Status: DC

## 2021-05-09 NOTE — Progress Notes (Signed)
°  Chief Complaint  Patient presents with   Knee Pain    LT knee/ pt has knot//no known injury    76 year old female with medial knee pain, also has some swelling beneath the joint line  No injury  Currently just takes Tylenol or over-the-counter Advil  BP (!) 157/96    Pulse 68    Ht 5\' 6"  (1.676 m)    Wt 253 lb (114.8 kg)    BMI 40.84 kg/m   Well-developed well-nourished grooming and hygiene are normal  Alert and oriented x3  Mood and affect normal   Left knee exam  Skin normal  Patient has significant varicosities she has had them stripped before they are all over her legs she has some swelling over the medial pes bursa and then proximal to the knee joint but most of this is adipose tissue  She has tenderness over the medial joint line she can straighten the knee and flex it 125 degrees I do not feel any instability  There is no peripheral edema associated with the varicosities in the extremities warm to touch  Sensation grossly intact The patient meets the AMA guidelines for Morbid (severe) obesity with a BMI > 40.0 and I have recommended weight loss.  X-ray shows mild arthritis medial compartment and patellofemoral compartment minimal osteophytes no major sclerosis in the subchondral bone  Encounter Diagnoses  Name Primary?   Chronic pain of left knee    Primary osteoarthritis of left knee Yes   Body mass index 40.0-44.9, adult (HCC)    Morbid obesity (HCC)     We are going to go ahead with her permission to inject the knee and start her on a low-dose anti-inflammatory  Follow-up as needed course we recommend weight loss.  Patient education.  Procedure note left knee injection   verbal consent was obtained to inject left knee joint  Timeout was completed to confirm the site of injection  The medications used were depomedrol 40 mg and 1% lidocaine 3 cc Anesthesia was provided by ethyl chloride and the skin was prepped with alcohol.  After cleaning the skin  with alcohol a 20-gauge needle was used to inject the left knee joint. There were no complications. A sterile bandage was applied.   Meds ordered this encounter  Medications   meloxicam (MOBIC) 7.5 MG tablet    Sig: Take 1 tablet (7.5 mg total) by mouth daily.    Dispense:  30 tablet    Refill:  5     FU PRN

## 2021-05-18 ENCOUNTER — Encounter: Payer: Self-pay | Admitting: Orthopedic Surgery

## 2021-05-18 ENCOUNTER — Other Ambulatory Visit: Payer: Self-pay

## 2021-05-18 ENCOUNTER — Ambulatory Visit (INDEPENDENT_AMBULATORY_CARE_PROVIDER_SITE_OTHER): Payer: PPO | Admitting: Orthopedic Surgery

## 2021-05-18 VITALS — Ht 66.0 in | Wt 253.0 lb

## 2021-05-18 DIAGNOSIS — M1712 Unilateral primary osteoarthritis, left knee: Secondary | ICD-10-CM | POA: Diagnosis not present

## 2021-05-18 MED ORDER — PREDNISONE 10 MG (21) PO TBPK: ORAL_TABLET | ORAL | 0 refills | Status: DC

## 2021-05-18 NOTE — Progress Notes (Signed)
Orthopaedic Clinic Return ? ?Assessment: ?Isabella Holmes is a 76 y.o. female with the following: ?Left knee pain following a steroid injection for left knee arthritis ? ?Plan: ?Isabella Holmes has pain in her left knee, approximately 10 days after a steroid injection.  On physical exam, she has no swelling, no redness, no warmth, no issues with the spot of the injection.  I provided reassurance, as I am not concerned about an infection.  She has decent range of motion.  At this point, she is likely having a response to the steroid injection.  Explained to her that approximately 20-25% of patients receiving an injection have increased pain following the injection.  We will plan to treat her pain with a prednisone Dosepak.  She can continue with meloxicam as needed.  No restrictions on her activities.  If she has any further issues, I am happy to see her in clinic.  Otherwise, she can follow-up with Dr. Aline Brochure. ? ?Meds ordered this encounter  ?Medications  ? predniSONE (STERAPRED UNI-PAK 21 TAB) 10 MG (21) TBPK tablet  ?  Sig: 10 mg DS 12 as directed  ?  Dispense:  48 tablet  ?  Refill:  0  ? ? ?Follow-up: ?Return for Follow up with Dr. Aline Brochure. ? ? ?Subjective: ? ?Chief Complaint  ?Patient presents with  ? Knee Pain  ?  Lt knee received injection 05/09/21 felt better for a couple days but has been it's worse over the past day or 2. Has tried elevation, icing and ibuprofen with minor to no relief.   ? ? ?History of Present Illness: ?Isabella Holmes is a 76 y.o. female who returns to clinic for repeat evaluation of her left knee pain.  She was seen in clinic last week with Dr. Aline Brochure.  She had a steroid injection in her left knee.  She states that her pain improved a little bit for 1-2 days.  However since then, her pain is progressively worsened.  She states is gotten much worse over the past 2 days.  She is having difficulty with range of motion.  She is also having difficulty walking.  She has not had any fevers.   She has not noticed any warmth around her right knee. ? ?Review of Systems: ?No fevers or chills ?No numbness or tingling ?No chest pain ?No shortness of breath ?No bowel or bladder dysfunction ?No GI distress ?No headaches ? ? ?Objective: ?Ht '5\' 6"'$  (1.676 m)   Wt 253 lb (114.8 kg)   BMI 40.84 kg/m?  ? ?Physical Exam: ? ?Alert and oriented.  No acute distress. ? ?Evaluation of the left knee demonstrates a mild effusion.  There is no redness.  No warmth is appreciated.  The injection site is not visible.  She is able to achieve full extension.  Flexion to approximately 90 degrees.  Toes are warm and well-perfused.  Tenderness to palpation over the medial joint line. ? ?Evaluation of the right knee demonstrates no swelling.  Well-healed anterior surgical incision.  No swelling of the right knee. ? ?IMAGING: ?I personally ordered and reviewed the following images: ? ?No new imaging obtained today. ? ? ?Mordecai Rasmussen, MD ?05/18/2021 ?9:08 AM ? ? ?

## 2021-05-23 ENCOUNTER — Other Ambulatory Visit: Payer: Self-pay

## 2021-05-23 ENCOUNTER — Ambulatory Visit (INDEPENDENT_AMBULATORY_CARE_PROVIDER_SITE_OTHER): Payer: PPO | Admitting: Orthopedic Surgery

## 2021-05-23 DIAGNOSIS — M25562 Pain in left knee: Secondary | ICD-10-CM

## 2021-05-23 DIAGNOSIS — G8929 Other chronic pain: Secondary | ICD-10-CM

## 2021-05-23 NOTE — Progress Notes (Signed)
Chief Complaint  ?Patient presents with  ? Follow-up  ?  Recheck on left knee, little better  ? ?76 year old female saw me on February 27 at that time complaining of atraumatic onset of medial knee pain with swelling below the joint line and some intra-articular swelling.  She was diagnosed with chronic pain left knee primary osteoarthritis and she had an injection and meloxicam along with home exercise program for strengthening of the quadriceps ? ?She came back 10 days later with increasing pain thought to be secondary due to the injection and she was put on a prednisone Dosepak she is here today with little improvement ? ?Exam ? ?The patient is ambulatory with a slight antalgic gait the skin is normal over the left knee the strength is normal the stability tests were good range of motion showed flexion 120 with pain extension full with tenderness medial joint line and provocative tests positive McMurray's for medial meniscal tear sensation was normal pulses were good, excessive varicosities noted ? ?X-ray showed arthritis of the knee I would call it moderate ? ?Diagnosis torn medial meniscus ?Osteoarthritis ?Chronic left knee pain ? ?Recommend MRI left knee ? ?Call patient with results ? ? ?

## 2021-05-23 NOTE — Patient Instructions (Signed)
While we are working on your approval for MRI please go ahead and call to schedule your appointment with [facility] within at least one (1) week.   Central Scheduling [phone]  

## 2021-06-09 ENCOUNTER — Other Ambulatory Visit: Payer: Self-pay | Admitting: Orthopedic Surgery

## 2021-06-09 DIAGNOSIS — G8929 Other chronic pain: Secondary | ICD-10-CM

## 2021-06-09 DIAGNOSIS — M1712 Unilateral primary osteoarthritis, left knee: Secondary | ICD-10-CM

## 2021-06-09 MED ORDER — MELOXICAM 7.5 MG PO TABS: 7.5000 mg | ORAL_TABLET | Freq: Every day | ORAL | 5 refills | Status: DC

## 2021-06-09 NOTE — Telephone Encounter (Signed)
Patient requests refill,  ?meloxicam (MOBIC) 7.5 MG tablet 30 tablet  ?      [facility] [place] ?

## 2021-06-09 NOTE — Telephone Encounter (Signed)
Request sent to provider.

## 2021-06-10 ENCOUNTER — Ambulatory Visit (HOSPITAL_COMMUNITY)
Admission: RE | Admit: 2021-06-10 | Discharge: 2021-06-10 | Disposition: A | Discharge status: Home / Self Care | Payer: PPO | Source: Ambulatory Visit | Attending: Orthopedic Surgery | Admitting: Orthopedic Surgery

## 2021-06-10 DIAGNOSIS — G8929 Other chronic pain: Secondary | ICD-10-CM | POA: Insufficient documentation

## 2021-06-10 DIAGNOSIS — M25562 Pain in left knee: Secondary | ICD-10-CM | POA: Diagnosis present

## 2021-06-14 ENCOUNTER — Encounter: Payer: Self-pay | Admitting: Pulmonary Disease

## 2021-06-14 ENCOUNTER — Ambulatory Visit (INDEPENDENT_AMBULATORY_CARE_PROVIDER_SITE_OTHER): Payer: Medicare HMO | Admitting: Pulmonary Disease

## 2021-06-14 VITALS — BP 122/80 | HR 66 | Temp 97.7°F | Ht 66.0 in | Wt 253.6 lb

## 2021-06-14 DIAGNOSIS — J4540 Moderate persistent asthma, uncomplicated: Secondary | ICD-10-CM

## 2021-06-14 MED ORDER — TRELEGY ELLIPTA 200-62.5-25 MCG/ACT IN AEPB: 1.0000 | INHALATION_SPRAY | Freq: Every day | RESPIRATORY_TRACT | 5 refills | Status: DC

## 2021-06-14 NOTE — Patient Instructions (Signed)
I am glad you are doing well with your breathing ?Continue Trelegy inhaler.  We will send refills for 1 year. ?We will get download from your DME company regarding your CPAP ?Follow-up in 1 year ?

## 2021-06-14 NOTE — Progress Notes (Signed)
? ?      ?Isabella Holmes    [id number]    1946-01-20 ? ?Primary Care Physician:Burdine, Ananias Pilgrim, MD ? ?Referring Physician: Royann Shivers, PA-C ?[address] ?[place],  [place] [address] ? ?Chief complaint: Follow-up for dyspnea, wheezing ? ? ?HPI: ?76 year old with history of asthma, sleep apnea ? ?Complains of worsening dyspnea, wheezing for the past 2 months.  She was given antibiotics and prednisone as an outpatient which did not help ? ?Admitted to [facility] in early October 2022 for shortness of breath.  Diagnosed with CHF, CT and examination showed mild bilateral opacities, negative PE.  She had elevated BNP.  Started on Lasix and recommended outpatient cardiology follow-up. Echocardiogram showed normal LV function ? ?She was evaluated in [facility] ED on October 23 with persistent shortness of breath.  This time it is felt to be vesicular secondary to her asthma and COPD and referred to pulmonary clinic ? ?She has history of childhood asthma and was told she has COPD but no PFTs on record ?Is just on albuterol as needed.  She is on GERD and is using Pepcid twice daily with good control of symptoms ? ?Had COVID twice in March 2020 and January 2022.  Did not require hospitalizations and recovered well from both episodes ?She ran out of trelegy in late December 2022.  She subsequently had 2 ED visits for dyspnea, wheezing and chest tightness with chest x-ray showing pulmonary congestion.  She was given Lasix and prednisone with improvement in symptoms.  On second visit she was given doxycycline ? ?Pets: No pets ?Occupation: Used to work in [employer].  Currently retired ?Exposures: No mold, hot tub, Jacuzzi.  No feather pillows or comforter ?Smoking history: 50-pack-year smoker.  Quit in 1982 ?Travel history: No significant travel history ?Relevant family history: No family history of lung disease ? ?Interim history: ?Symptoms worsened after she was off trelegy.  Trelegy was resumed in  January 2023 with improvement in symptoms.  States that her dyspnea is back to normal now with no new issues. ?Cardiac stress test in January 2023 did not show any acute abnormality ? ? ?Outpatient Encounter Medications as of 06/14/2021  ?Medication Sig  ? albuterol (PROVENTIL HFA;VENTOLIN HFA) 108 (90 Base) MCG/ACT inhaler Inhale 1-2 puffs into the lungs every 4 (four) hours as needed for wheezing or shortness of breath.  ? albuterol (PROVENTIL) (2.5 MG/3ML) 0.083% nebulizer solution Take 2.5 mg by nebulization every 6 (six) hours as needed.  ? aspirin EC 325 MG EC tablet Take 1 tablet (325 mg total) by mouth daily with breakfast.  ? cetirizine (ZYRTEC) 10 MG tablet Take 10 mg by mouth as needed for allergies.  ? Cholecalciferol (VITAMIN D3) 25 MCG (1000 UT) CAPS Take 1 capsule by mouth daily.  ? famotidine (PEPCID) 20 MG tablet Take 20 mg by mouth 2 (two) times daily.  ? FLUoxetine (PROZAC) 20 MG capsule Take 20 mg by mouth daily.  ? Fluticasone-Umeclidin-Vilant (TRELEGY ELLIPTA) 200-62.5-25 MCG/ACT AEPB Inhale 1 puff into the lungs daily.  ? IBU 600 MG tablet Take 1 tablet (600 mg total) by mouth every 6 (six) hours as needed.  ? lamoTRIgine (LAMICTAL) 150 MG tablet Take 150 mg by mouth at bedtime.  ? levothyroxine (SYNTHROID, LEVOTHROID) 50 MCG tablet Take 50 mcg by mouth daily before breakfast.   ? meloxicam (MOBIC) 7.5 MG tablet Take 1 tablet (7.5 mg total) by mouth daily.  ? [DISCONTINUED] predniSONE (STERAPRED UNI-PAK 21 TAB) 10  MG (21) TBPK tablet 10 mg DS 12 as directed  ? [DISCONTINUED] docusate sodium (COLACE) 100 MG capsule Take 1 capsule (100 mg total) by mouth 2 (two) times daily. (Patient taking differently: Take 100 mg by mouth as needed for mild constipation.)  ? [DISCONTINUED] Fluticasone-Umeclidin-Vilant (TRELEGY ELLIPTA) 200-62.5-25 MCG/ACT AEPB Inhale 1 puff into the lungs daily.  ? [DISCONTINUED] Fluticasone-Umeclidin-Vilant (TRELEGY ELLIPTA) 200-62.5-25 MCG/ACT AEPB Inhale 1 puff into the lungs  daily.  ? [DISCONTINUED] furosemide (LASIX) 20 MG tablet Take 1 tablet (20 mg total) by mouth daily.  ? [DISCONTINUED] potassium chloride SA (KLOR-CON M) 20 MEQ tablet Take 1 tablet (20 mEq total) by mouth daily.  ? ?No facility-administered encounter medications on file as of 06/14/2021.  ? ?Physical Exam: ?Blood pressure 122/80, pulse 66, temperature 97.7 ?F (36.5 ?C), temperature source Oral, height 5\' 6"  (1.676 m), weight 253 lb 9.6 oz (115 kg), SpO2 95 %. ?Gen:      No acute distress ?HEENT:  EOMI, sclera anicteric ?Neck:     No masses; no thyromegaly ?Lungs:    Clear to auscultation bilaterally; normal respiratory effort ?CV:         Regular rate and rhythm; no murmurs ?Abd:      + bowel sounds; soft, non-tender; no palpable masses, no distension ?Ext:    No edema; adequate peripheral perfusion ?Skin:      Warm and dry; no rash ?Neuro: alert and oriented x 3 ?Psych: normal mood and affect  ? ?Data Reviewed: ?Imaging: ?CTA from [facility] 12/17/2020-no evidence of PE, mild cardiomegaly with mild interstitial edema, enlarged thyroid gland with calcifications. ? ?Chest x-ray 03/08/2021-mild pulmonary vascular congestion ?Chest x-ray 03/13/2021-no acute cardiopulmonary abnormality ?I have reviewed the images personally. ? ?PFTs: ?03/17/2021 ?FVC 2.58 [87%), FEV1 2.01 [90%], F/F 78, TLC 5.38 [104%], DLCO 23.23 [118%] ?Normal test ? ?Labs: ?CBC 05/23/2017-WBC 4.6, eos 4.3%, absolute eosinophil count 244 ? ?Cardiac: ?Echocardiogram 12/19/2020-LVEF 65-70%, normal RV size and function ? ?Cardiac stress test 03/25/2021-no ischemia, low risk study. ? ?Assessment:  ?Asthma ?Suspect her dyspnea is combination of asthma, HFpEF ?No evidence of COPD and PFTs which were normal ? ?She has responded well to Trelegy and will continue the same ?Advised weight loss with diet and exercise which will improve dyspnea ? ?OSA ?She is on CPAP for many years and does not have antibody monitoring dose ?No record of sleep study ?We will get download  for review. ? ? ?Plan/Recommendations: ?Continue Trelegy ?CPAP download ? ?Chilton Greathouse MD ?[facility] ?06/14/2021, 3:39 PM ? ?CC: Royann Shivers, * ? ?  ?

## 2021-06-22 ENCOUNTER — Ambulatory Visit: Payer: Medicare HMO | Admitting: Orthopedic Surgery

## 2021-06-22 ENCOUNTER — Encounter: Payer: Self-pay | Admitting: Orthopedic Surgery

## 2021-06-22 VITALS — BP 158/78 | HR 72 | Ht 66.0 in | Wt 253.0 lb

## 2021-06-22 DIAGNOSIS — M23203 Derangement of unspecified medial meniscus due to old tear or injury, right knee: Secondary | ICD-10-CM

## 2021-06-22 DIAGNOSIS — Z6841 Body Mass Index (BMI) 40.0 and over, adult: Secondary | ICD-10-CM

## 2021-06-22 DIAGNOSIS — M1712 Unilateral primary osteoarthritis, left knee: Secondary | ICD-10-CM | POA: Diagnosis not present

## 2021-06-22 NOTE — Progress Notes (Signed)
FOLLOW UP  ? ?Encounter Diagnoses  ?Name Primary?  ? Body mass index 40.0-44.9, adult ([place]) Yes  ? Class 3 severe obesity due to excess calories without serious comorbidity with body mass index (BMI) of 40.0 to 44.9 in adult [facility])   ? Primary osteoarthritis of left knee   ? Degenerative tear of medial meniscus of right knee   ? ? ? ?Chief Complaint  ?Patient presents with  ? Knee Pain  ?  Lt knee pain and swelling for 4-5 months. Pain no better and swelling possibly worse.   ? ? ? ?Isabella Holmes had her MRI she has complex macerated tear of the body of the medial meniscus with 3 compartment degenerative change most severe medial compartment with full-thickness cartilage loss ? ?We discussed this.  Her alignment is good she has narrowing in the medial compartment with grade 3 arthritis on x-ray ? ?She had arthroscopic surgery followed by knee replacement last time on the right side and prefers not to do that again ? ?However her BMI is now 40.8 so we are going to advise that she have a period to try to lose 8 pounds down to 245 because she prefers the left total knee instead of arthroscopic surgery ? ?Goal weight is 245 pounds we will recheck this in 3 weeks with a tentative surgery date in May currently tentative date is the 23rd ? ?Exam of the left knee shows tenderness of the medial joint line range of motion is 0 to 115 degrees she has extensive varicose veins this particular leg is status post vein stripping ? ?She had a recent checkup and had excellent visit with her doctor she only has sleep apnea but does not use a CPAP ? ?Follow-up check weight goal weight 245 tentative date of surgery 23rd May ? ? ?

## 2021-07-09 ENCOUNTER — Other Ambulatory Visit: Payer: Self-pay | Admitting: Pulmonary Disease

## 2021-07-15 ENCOUNTER — Ambulatory Visit: Payer: Medicare HMO | Admitting: Orthopedic Surgery

## 2021-07-15 DIAGNOSIS — Z20828 Contact with and (suspected) exposure to other viral communicable diseases: Secondary | ICD-10-CM | POA: Diagnosis not present

## 2021-07-18 ENCOUNTER — Ambulatory Visit: Payer: Medicare HMO | Admitting: Orthopedic Surgery

## 2021-07-18 VITALS — Ht 66.0 in | Wt 242.0 lb

## 2021-07-18 DIAGNOSIS — M1712 Unilateral primary osteoarthritis, left knee: Secondary | ICD-10-CM | POA: Diagnosis not present

## 2021-07-18 NOTE — Progress Notes (Signed)
Chief Complaint  ?Patient presents with  ? Follow-up  ?  Recheck on left knee, weight check.  ? ? ?Isabella Holmes has lost 11 pounds in basically a couple of weeks she is ready for surgery ? ?Her right total knee was complicated by arthrofibrosis requiring manipulation she recovered nicely now has full range of motion her left knee is still painful and she wishes to proceed with left total knee replacement ? ?She has had a persistent cough on and on over the last year to year and a half. ? ?She has had pulmonary function test cardiology evaluation but is currently on some medication to help with the cough ? ?We will get a medical clearance ? ?She has full extension now she can flex 120 degrees now her x-rays show grade 3 arthritis based on the amount of narrowing of the medial compartment minimal if any varus ? ? ?Past Medical History:  ?Diagnosis Date  ? Arthritis   ? Asthma   ? Chest pain   ? w abnormal ECg  ? Chronic headaches   ? migraine  ? COPD (chronic obstructive pulmonary disease) ([place])   ? Dyslipidemia   ? Dyspnea   ? Dyspnea   ? Glucose intolerance (impaired glucose tolerance)   ? Hemorrhoids 03/25/2015  ? Hypothyroidism   ? Seizure disorder ([place])   ? on Lamictal; has not had seizure in 15 years  ? Seizures ([place])   ? Sleep apnea   ? lost weight and does not need CPAP any more.  ? Varicose veins 03/25/2015  ? Venous insufficiency   ? ?Past Surgical History:  ?Procedure Laterality Date  ? EXAM UNDER ANESTHESIA WITH MANIPULATION OF KNEE Right 04/05/2017  ? Procedure: EXAM UNDER ANESTHESIA WITH MANIPULATION OF RIGHT KNEE;  Surgeon: Carole Civil, MD;  Location: [facility];  Service: Orthopedics;  Laterality: Right;  ? KNEE ARTHROSCOPY Left   ? KNEE ARTHROSCOPY WITH MEDIAL MENISECTOMY Right 06/28/2016  ? Procedure: KNEE ARTHROSCOPY WITH MEDIAL MENISECTOMY;  Surgeon: Carole Civil, MD;  Location: [facility];  Service: Orthopedics;  Laterality: Right;  ? left vein stripping    ? status post bilateral tubaligation     ? TONSILLECTOMY    ? many years ago  ? TOTAL KNEE ARTHROPLASTY Right 02/13/2017  ? Procedure: TOTAL KNEE ARTHROPLASTY;  Surgeon: Carole Civil, MD;  Location: [facility];  Service: Orthopedics;  Laterality: Right;  ? TUBAL LIGATION    ? ?Family History  ?Problem Relation Age of Onset  ? Heart attack Mother   ? Tuberculosis Father   ? Heart disease Father   ?     cardiac disease  ? Cirrhosis Father   ? Factor V Leiden deficiency Other   ? Seizures Son   ? ?Social History  ? ?Tobacco Use  ? Smoking status: Former  ?  Packs/day: 2.50  ?  Years: 20.00  ?  Pack years: 50.00  ?  Types: Cigarettes  ?  Quit date: 06/23/1986  ?  Years since quitting: 35.0  ? Smokeless tobacco: Never  ?Vaping Use  ? Vaping Use: Never used  ?Substance Use Topics  ? Alcohol use: No  ? Drug use: No  ? ? ?Current Outpatient Medications:  ?  Fluticasone-Umeclidin-Vilant (TRELEGY ELLIPTA) 200-62.5-25 MCG/ACT AEPB, Inhale 1 puff by mouth once daily, Disp: 60 each, Rfl: 5 ?  albuterol (PROVENTIL HFA;VENTOLIN HFA) 108 (90 Base) MCG/ACT inhaler, Inhale 1-2 puffs into the lungs every 4 (four) hours as needed for wheezing or shortness  of breath., Disp: , Rfl:  ?  albuterol (PROVENTIL) (2.5 MG/3ML) 0.083% nebulizer solution, Take 2.5 mg by nebulization every 6 (six) hours as needed., Disp: , Rfl:  ?  aspirin EC 325 MG EC tablet, Take 1 tablet (325 mg total) by mouth daily with breakfast. (Patient not taking: Reported on 06/22/2021), Disp: 30 tablet, Rfl: 0 ?  cetirizine (ZYRTEC) 10 MG tablet, Take 10 mg by mouth as needed for allergies., Disp: , Rfl:  ?  Cholecalciferol (VITAMIN D3) 25 MCG (1000 UT) CAPS, Take 1 capsule by mouth daily., Disp: , Rfl:  ?  famotidine (PEPCID) 20 MG tablet, Take 20 mg by mouth 2 (two) times daily., Disp: , Rfl:  ?  FLUoxetine (PROZAC) 20 MG capsule, Take 20 mg by mouth daily., Disp: , Rfl:  ?  lamoTRIgine (LAMICTAL) 150 MG tablet, Take 150 mg by mouth at bedtime., Disp: , Rfl:  ?  levothyroxine (SYNTHROID, LEVOTHROID) 50 MCG  tablet, Take 50 mcg by mouth daily before breakfast. , Disp: , Rfl:  ?  meloxicam (MOBIC) 7.5 MG tablet, Take 1 tablet (7.5 mg total) by mouth daily., Disp: 30 tablet, Rfl: 5 ? ?Plan is for left total knee with a Sigma fixed-bearing total knee ? ?The procedure has been fully reviewed with the patient; The risks and benefits of surgery have been discussed and explained and understood. Alternative treatment has also been reviewed, questions were encouraged and answered. The postoperative plan is also been reviewed. ? ? ? ? ?

## 2021-07-19 DIAGNOSIS — Z6841 Body Mass Index (BMI) 40.0 and over, adult: Secondary | ICD-10-CM | POA: Diagnosis not present

## 2021-07-19 DIAGNOSIS — E039 Hypothyroidism, unspecified: Secondary | ICD-10-CM | POA: Diagnosis not present

## 2021-07-19 DIAGNOSIS — F322 Major depressive disorder, single episode, severe without psychotic features: Secondary | ICD-10-CM | POA: Diagnosis not present

## 2021-07-19 DIAGNOSIS — G4733 Obstructive sleep apnea (adult) (pediatric): Secondary | ICD-10-CM | POA: Diagnosis not present

## 2021-07-19 DIAGNOSIS — I5030 Unspecified diastolic (congestive) heart failure: Secondary | ICD-10-CM | POA: Diagnosis not present

## 2021-07-19 DIAGNOSIS — Z01818 Encounter for other preprocedural examination: Secondary | ICD-10-CM | POA: Diagnosis not present

## 2021-07-19 DIAGNOSIS — M1712 Unilateral primary osteoarthritis, left knee: Secondary | ICD-10-CM | POA: Diagnosis not present

## 2021-07-19 DIAGNOSIS — I4891 Unspecified atrial fibrillation: Secondary | ICD-10-CM | POA: Diagnosis not present

## 2021-07-19 DIAGNOSIS — Z7689 Persons encountering health services in other specified circumstances: Secondary | ICD-10-CM | POA: Diagnosis not present

## 2021-07-19 DIAGNOSIS — E782 Mixed hyperlipidemia: Secondary | ICD-10-CM | POA: Diagnosis not present

## 2021-07-19 DIAGNOSIS — E7849 Other hyperlipidemia: Secondary | ICD-10-CM | POA: Diagnosis not present

## 2021-07-19 DIAGNOSIS — J45909 Unspecified asthma, uncomplicated: Secondary | ICD-10-CM | POA: Diagnosis not present

## 2021-07-19 DIAGNOSIS — R8279 Other abnormal findings on microbiological examination of urine: Secondary | ICD-10-CM | POA: Diagnosis not present

## 2021-07-19 DIAGNOSIS — G40909 Epilepsy, unspecified, not intractable, without status epilepticus: Secondary | ICD-10-CM | POA: Diagnosis not present

## 2021-07-19 DIAGNOSIS — R69 Illness, unspecified: Secondary | ICD-10-CM | POA: Diagnosis not present

## 2021-07-22 ENCOUNTER — Other Ambulatory Visit: Payer: Self-pay

## 2021-07-22 DIAGNOSIS — M1712 Unilateral primary osteoarthritis, left knee: Secondary | ICD-10-CM

## 2021-07-22 DIAGNOSIS — G8929 Other chronic pain: Secondary | ICD-10-CM

## 2021-07-22 MED ORDER — BUPIVACAINE-MELOXICAM ER 200-6 MG/7ML IJ SOLN: 400.0000 mg | Freq: Once | INTRAMUSCULAR | Status: DC

## 2021-07-25 DIAGNOSIS — Z6841 Body Mass Index (BMI) 40.0 and over, adult: Secondary | ICD-10-CM | POA: Diagnosis not present

## 2021-07-25 DIAGNOSIS — J40 Bronchitis, not specified as acute or chronic: Secondary | ICD-10-CM | POA: Diagnosis not present

## 2021-07-25 DIAGNOSIS — B351 Tinea unguium: Secondary | ICD-10-CM | POA: Diagnosis not present

## 2021-07-25 DIAGNOSIS — I10 Essential (primary) hypertension: Secondary | ICD-10-CM | POA: Diagnosis not present

## 2021-07-26 DIAGNOSIS — Z1231 Encounter for screening mammogram for malignant neoplasm of breast: Secondary | ICD-10-CM | POA: Diagnosis not present

## 2021-07-27 NOTE — Patient Instructions (Signed)
? ? ? ? ? ? ? ? ? ? Isabella Holmes ? 07/27/2019  ?  ? '@PREFPERIOPPHARMACY'$ @ ? ? Your procedure is scheduled on  08/02/2021. ? ? Report to [facility] at  1000  A.M. ? ? Call this number if you have problems the morning of surgery: ? [phone] ? ? Remember: ? Do not eat or drink after midnight. ?  ?  ? Take these medicines the morning of surgery with A SIP OF WATER  ? ?          pepcid, allegra, prozac, lamictal, levothyroxine, mobic(if needed), prednisone. ?  ? ? Do not wear jewelry, make-up or nail polish. ? Do not wear lotions, powders, or perfumes, or deodorant. ? Do not shave 48 hours prior to surgery.  Men may shave face and neck. ? Do not bring valuables to the hospital. ? [facility] is not responsible for any belongings or valuables. ? ?Contacts, dentures or bridgework may not be worn into surgery.  Leave your suitcase in the car.  After surgery it may be brought to your room. ? ?For patients admitted to the hospital, discharge time will be determined by your treatment team. ? ?Patients discharged the day of surgery will not be allowed to drive home and must have someone with them for 24 hours.  ? ? ?Special instructions:   DO NOT smoke tobacco or vape for 24 hours before your procedure. ? ?Please read over the following fact sheets that you were given. ?Pain Booklet, Coughing and Deep Breathing, Blood Transfusion Information, Total Joint Packet, Surgical Site Infection Prevention, Anesthesia Post-op Instructions, and Care and Recovery After Surgery ?  ? ? ? Total Knee Replacement, Care After ?This sheet gives you information about how to care for yourself after your procedure. Your health care provider may also give you more specific instructions. If you have problems or questions, contact your health care provider. ?What can I expect after the procedure? ?After the procedure, it is common to have: ?Redness, pain, and swelling at the incision area. ?Stiffness. ?Discomfort. ?A small amount of blood or clear  fluid coming from your incision. ?Follow these instructions at home: ?Medicines ?Take over-the-counter and prescription medicines only as told by your health care provider. ?If you were prescribed a blood thinner (anticoagulant), take it as told by your health care provider. ?Ask your health care provider if the medicine prescribed to you: ?Requires you to avoid driving or using machinery. ?Can cause constipation. You may need to take these actions to prevent or treat constipation: ?Drink enough fluid to keep your urine pale yellow. ?Take over-the-counter or prescription medicines. ?Eat foods that are high in fiber, such as beans, whole grains, and fresh fruits and vegetables. ?Limit foods that are high in fat and processed sugars, such as fried or sweet foods. ?Incision care ? ?Follow instructions from your health care provider about how to take care of your incision. Make sure you: ?Wash your hands with soap and water for at least 20 seconds before and after you change your bandage (dressing). If soap and water are not available, use hand sanitizer. ?Change your dressing as told by your health care provider. ?Leave stitches (sutures), staples, skin glue, or adhesive strips in place. These skin closures may need to stay in place for 2 weeks or longer. If adhesive strip edges start to loosen and curl up, you may trim the loose edges. Do not remove adhesive strips completely unless your health care provider tells you to  do that. ?Do not take baths, swim, or use a hot tub until your health care provider approves. ?Check your incision area every day for signs of infection. Check for: ?More redness, swelling, or pain. ?More fluid or blood. ?Warmth. ?Pus or a bad smell. ?Activity ?Rest as told by your health care provider. ?Avoid sitting for a long time without moving. Get up to take short walks every 1-2 hours. This is important to improve blood flow and breathing. Ask for help if you feel weak or unsteady. ?Follow  instructions from your health care provider about using a walker, crutches, or a cane. ?You may use your legs to support (bear) your body weight as told by your health care provider. Follow instructions about how much weight you may safely support on your affected leg (weight-bearing restrictions). ?A physical therapist may show you how to get out of a bed and chair and how to go up and down stairs. You will first do this with a walker, crutches, or a cane and then without any of these devices. ?Once you are able to walk without a limp, you may stop using a walker, crutches, or a cane. ?Do exercises as told by your health care provider or physical therapist. ?Avoid high-impact activities, including running, jumping rope, and doing jumping jacks. ?Do not play contact sports until your health care provider approves. ?Return to your normal activities as told by your health care provider. Ask your health care provider what activities are safe for you. ?Managing pain, stiffness, and swelling ? ?If directed, put ice on your knee. To do this: ?Put ice in a plastic bag or use the icing device (cold flow pad) that you were given. Follow instructions from your health care provider about how to use the icing device. ?Place a towel between your skin and the bag or between your skin and the icing device. ?Leave the ice on for 20 minutes, 2-3 times a day. ?Remove the ice if your skin turns bright red. This is very important. If you cannot feel pain, heat, or cold, you have a greater risk of damage to the area. ?Move your toes often to reduce stiffness and swelling. ?Raise (elevate) your leg above the level of your heart while you are sitting or lying down. ?Use several pillows to keep your leg straight. ?Do not put a pillow just under the knee. If the knee is bent for a long time, this may lead to stiffness. ?Wear elastic knee support as told by your health care provider. ?Safety ? ?To help prevent falls, keep floors clear of  objects you may trip over. Place items that you may need within easy reach. ?Wear an apron or tool belt with pockets for carrying objects. This leaves your hands free to help with your balance. ?Ask your health care provider when it is safe to drive. ?General instructions ?Wear compression stockings as told by your health care provider. These stockings help to prevent blood clots and reduce swelling in your legs. ?Continue with breathing exercises. This helps prevent lung infection. ?Do not use any products that contain nicotine or tobacco. These products include cigarettes, chewing tobacco, and vaping devices, such as e-cigarettes. These can delay healing after surgery. If you need help quitting, ask your health care provider. ?Tell your health care provider if you plan to have dental work. Also: ?Tell your dentist about your joint replacement. ?Ask your health care provider if there are any special instructions you need to follow before having dental care  and routine cleanings. ?Keep all follow-up visits. This is important. ?Contact a health care provider if: ?You have a fever or chills. ?You have a cough or feel short of breath. ?Your medicine is not controlling your pain. ?You have any of these signs of infection: ?More redness, swelling, or pain around your incision. ?More fluid or blood coming from your incision. ?Warmth coming from your incision. ?Pus or a bad smell coming from your incision. ?You fall. ?Get help right away if: ?You have severe pain. ?You have trouble breathing. ?You have chest pain. ?You have redness, swelling, pain, or warmth in your calf or leg. ?Your incision breaks open after sutures or staples are removed. ?These symptoms may represent a serious problem that is an emergency. Do not wait to see if the symptoms will go away. Get medical help right away. Call your local emergency services (911 in the [place] Do not drive yourself to the hospital. ?Summary ?After the procedure, it is common  to have pain and swelling at the incision area, a small amount of blood or fluid coming from your incision, and stiffness. ?Follow instructions from your health care provider about how to take care of your

## 2021-07-29 ENCOUNTER — Other Ambulatory Visit: Payer: Self-pay

## 2021-07-29 ENCOUNTER — Telehealth: Payer: Self-pay | Admitting: Orthopedic Surgery

## 2021-07-29 ENCOUNTER — Encounter (HOSPITAL_COMMUNITY): Payer: Self-pay

## 2021-07-29 ENCOUNTER — Encounter (HOSPITAL_COMMUNITY)
Admission: RE | Admit: 2021-07-29 | Discharge: 2021-07-29 | Disposition: A | Discharge status: Home / Self Care | Payer: Medicare HMO | Source: Ambulatory Visit | Attending: Orthopedic Surgery | Admitting: Orthopedic Surgery

## 2021-07-29 DIAGNOSIS — Z01812 Encounter for preprocedural laboratory examination: Secondary | ICD-10-CM | POA: Insufficient documentation

## 2021-07-29 DIAGNOSIS — M25562 Pain in left knee: Secondary | ICD-10-CM | POA: Insufficient documentation

## 2021-07-29 DIAGNOSIS — G8929 Other chronic pain: Secondary | ICD-10-CM | POA: Insufficient documentation

## 2021-07-29 DIAGNOSIS — M1712 Unilateral primary osteoarthritis, left knee: Secondary | ICD-10-CM | POA: Diagnosis not present

## 2021-07-29 LAB — TYPE AND SCREEN
ABO/RH(D): A POS
Antibody Screen: NEGATIVE

## 2021-07-29 LAB — CBC
HCT: 42.5 % (ref 36.0–46.0)
Hemoglobin: 14 g/dL (ref 12.0–15.0)
MCH: 30 pg (ref 26.0–34.0)
MCHC: 32.9 g/dL (ref 30.0–36.0)
MCV: 91 fL (ref 80.0–100.0)
Platelets: 248 10*3/uL (ref 150–400)
RBC: 4.67 MIL/uL (ref 3.87–5.11)
RDW: 13.2 % (ref 11.5–15.5)
WBC: 3.9 10*3/uL — ABNORMAL LOW (ref 4.0–10.5)
nRBC: 0 % (ref 0.0–0.2)

## 2021-07-29 LAB — BASIC METABOLIC PANEL
Anion gap: 9 (ref 5–15)
BUN: 17 mg/dL (ref 8–23)
CO2: 28 mmol/L (ref 22–32)
Calcium: 9.5 mg/dL (ref 8.9–10.3)
Chloride: 103 mmol/L (ref 98–111)
Creatinine, Ser: 0.9 mg/dL (ref 0.44–1.00)
GFR, Estimated: >60 mL/min (ref >60)
Glucose, Bld: 94 mg/dL (ref 70–99)
Potassium: 3.7 mmol/L (ref 3.5–5.1)
Sodium: 140 mmol/L (ref 135–145)

## 2021-07-29 NOTE — Telephone Encounter (Signed)
Call via voice message received from Dr Pleas Koch at [facility] stating to call him  - no phone number or information was left on message. I relayed to Abigail Butts, Scientist, physiological, and to [facility], Doctor, general practice. I had also called back to their office at main # [phone] however it was closed. Voice message said you may call the [facility] for Dr on call if needed at [phone]

## 2021-08-01 NOTE — Telephone Encounter (Signed)
I spoke to Ms. Supak regarding her surgery today is her last day of steroids.  Working to postpone the surgery until she is off steroids for 30 days  She also has a toe injury.  She will see podiatry.  It is okay to have treatment for that including antifungal drug.  She also has a mole which she wants removed.  It can be removed.  As long as there is no infection after the removal surgery can proceed in 30 days

## 2021-08-04 DIAGNOSIS — I10 Essential (primary) hypertension: Secondary | ICD-10-CM | POA: Diagnosis not present

## 2021-08-04 DIAGNOSIS — Z6841 Body Mass Index (BMI) 40.0 and over, adult: Secondary | ICD-10-CM | POA: Diagnosis not present

## 2021-08-04 DIAGNOSIS — J45901 Unspecified asthma with (acute) exacerbation: Secondary | ICD-10-CM | POA: Diagnosis not present

## 2021-08-11 ENCOUNTER — Encounter: Payer: Self-pay | Admitting: Orthopedic Surgery

## 2021-08-19 ENCOUNTER — Ambulatory Visit (HOSPITAL_COMMUNITY): Payer: Medicare HMO

## 2021-08-22 ENCOUNTER — Encounter (HOSPITAL_COMMUNITY): Payer: Medicare HMO

## 2021-08-24 ENCOUNTER — Ambulatory Visit (HOSPITAL_COMMUNITY): Payer: Medicare HMO | Admitting: Physical Therapy

## 2021-08-24 DIAGNOSIS — L110 Acquired keratosis follicularis: Secondary | ICD-10-CM | POA: Diagnosis not present

## 2021-08-24 DIAGNOSIS — L609 Nail disorder, unspecified: Secondary | ICD-10-CM | POA: Diagnosis not present

## 2021-08-24 DIAGNOSIS — M79675 Pain in left toe(s): Secondary | ICD-10-CM | POA: Diagnosis not present

## 2021-08-24 DIAGNOSIS — B351 Tinea unguium: Secondary | ICD-10-CM | POA: Diagnosis not present

## 2021-08-24 DIAGNOSIS — M79672 Pain in left foot: Secondary | ICD-10-CM | POA: Diagnosis not present

## 2021-08-24 DIAGNOSIS — M79671 Pain in right foot: Secondary | ICD-10-CM | POA: Diagnosis not present

## 2021-08-24 DIAGNOSIS — M79674 Pain in right toe(s): Secondary | ICD-10-CM | POA: Diagnosis not present

## 2021-08-26 ENCOUNTER — Encounter (HOSPITAL_COMMUNITY): Payer: Medicare HMO

## 2021-08-29 ENCOUNTER — Encounter (HOSPITAL_COMMUNITY): Payer: Medicare HMO

## 2021-08-31 ENCOUNTER — Encounter (HOSPITAL_COMMUNITY): Payer: Medicare HMO

## 2021-08-31 ENCOUNTER — Telehealth: Payer: Self-pay | Admitting: Radiology

## 2021-08-31 NOTE — Telephone Encounter (Signed)
Patient is scheduled for Total knee replacement on July 18th wants to cancel for now, she is feeling better pain not as bad and she is still having some episodes of shortness of breath she wants to  work out before surgery  I have advised the OR to cancel   To you FYI

## 2021-09-02 ENCOUNTER — Encounter (HOSPITAL_COMMUNITY): Payer: Medicare HMO

## 2021-09-05 ENCOUNTER — Encounter (HOSPITAL_COMMUNITY): Payer: Medicare HMO

## 2021-09-07 ENCOUNTER — Encounter (HOSPITAL_COMMUNITY): Payer: Medicare HMO | Admitting: Physical Therapy

## 2021-09-09 ENCOUNTER — Encounter (HOSPITAL_COMMUNITY): Payer: Medicare HMO

## 2021-09-27 ENCOUNTER — Ambulatory Visit (HOSPITAL_COMMUNITY): Admission: RE | Admit: 2021-09-27 | Payer: Medicare HMO | Source: Home / Self Care | Admitting: Orthopedic Surgery

## 2021-09-27 ENCOUNTER — Encounter (HOSPITAL_COMMUNITY): Admission: RE | Payer: Self-pay | Source: Home / Self Care

## 2021-09-27 SURGERY — ARTHROPLASTY, KNEE, TOTAL
Anesthesia: General | Site: Knee | Laterality: Left

## 2021-10-10 DIAGNOSIS — M419 Scoliosis, unspecified: Secondary | ICD-10-CM | POA: Diagnosis not present

## 2021-10-10 DIAGNOSIS — J45909 Unspecified asthma, uncomplicated: Secondary | ICD-10-CM | POA: Diagnosis not present

## 2021-10-10 DIAGNOSIS — Z6841 Body Mass Index (BMI) 40.0 and over, adult: Secondary | ICD-10-CM | POA: Diagnosis not present

## 2021-10-10 DIAGNOSIS — M5136 Other intervertebral disc degeneration, lumbar region: Secondary | ICD-10-CM | POA: Diagnosis not present

## 2021-10-10 DIAGNOSIS — R053 Chronic cough: Secondary | ICD-10-CM | POA: Diagnosis not present

## 2021-10-10 DIAGNOSIS — R296 Repeated falls: Secondary | ICD-10-CM | POA: Diagnosis not present

## 2021-10-17 DIAGNOSIS — R030 Elevated blood-pressure reading, without diagnosis of hypertension: Secondary | ICD-10-CM | POA: Diagnosis not present

## 2021-10-17 DIAGNOSIS — J020 Streptococcal pharyngitis: Secondary | ICD-10-CM | POA: Diagnosis not present

## 2021-10-17 DIAGNOSIS — Z20828 Contact with and (suspected) exposure to other viral communicable diseases: Secondary | ICD-10-CM | POA: Diagnosis not present

## 2021-10-17 DIAGNOSIS — Z6841 Body Mass Index (BMI) 40.0 and over, adult: Secondary | ICD-10-CM | POA: Diagnosis not present

## 2021-10-20 ENCOUNTER — Other Ambulatory Visit: Payer: Self-pay | Admitting: Orthopedic Surgery

## 2021-10-20 DIAGNOSIS — G8929 Other chronic pain: Secondary | ICD-10-CM

## 2021-10-20 DIAGNOSIS — M1712 Unilateral primary osteoarthritis, left knee: Secondary | ICD-10-CM

## 2021-11-10 ENCOUNTER — Telehealth: Payer: Self-pay | Admitting: Pulmonary Disease

## 2021-11-10 MED ORDER — TRELEGY ELLIPTA 200-62.5-25 MCG/ACT IN AEPB: 1.0000 | INHALATION_SPRAY | Freq: Every day | RESPIRATORY_TRACT | 5 refills | Status: DC

## 2021-11-10 NOTE — Telephone Encounter (Signed)
Called and spoke with Tammy ([facility]).  Trelegy prescription printed and faxed to [facility].  Nothing further at this time.

## 2021-11-16 DIAGNOSIS — Z0001 Encounter for general adult medical examination with abnormal findings: Secondary | ICD-10-CM | POA: Diagnosis not present

## 2021-11-16 DIAGNOSIS — M5136 Other intervertebral disc degeneration, lumbar region: Secondary | ICD-10-CM | POA: Diagnosis not present

## 2021-11-16 DIAGNOSIS — Z20828 Contact with and (suspected) exposure to other viral communicable diseases: Secondary | ICD-10-CM | POA: Diagnosis not present

## 2021-11-16 DIAGNOSIS — G40909 Epilepsy, unspecified, not intractable, without status epilepticus: Secondary | ICD-10-CM | POA: Diagnosis not present

## 2021-11-16 DIAGNOSIS — R030 Elevated blood-pressure reading, without diagnosis of hypertension: Secondary | ICD-10-CM | POA: Diagnosis not present

## 2021-11-16 DIAGNOSIS — K219 Gastro-esophageal reflux disease without esophagitis: Secondary | ICD-10-CM | POA: Diagnosis not present

## 2021-11-16 DIAGNOSIS — R69 Illness, unspecified: Secondary | ICD-10-CM | POA: Diagnosis not present

## 2021-11-16 DIAGNOSIS — M419 Scoliosis, unspecified: Secondary | ICD-10-CM | POA: Diagnosis not present

## 2021-11-16 DIAGNOSIS — M1712 Unilateral primary osteoarthritis, left knee: Secondary | ICD-10-CM | POA: Diagnosis not present

## 2021-11-16 DIAGNOSIS — R053 Chronic cough: Secondary | ICD-10-CM | POA: Diagnosis not present

## 2021-11-16 DIAGNOSIS — G4733 Obstructive sleep apnea (adult) (pediatric): Secondary | ICD-10-CM | POA: Diagnosis not present

## 2021-11-16 DIAGNOSIS — J45909 Unspecified asthma, uncomplicated: Secondary | ICD-10-CM | POA: Diagnosis not present

## 2021-11-17 DIAGNOSIS — G4733 Obstructive sleep apnea (adult) (pediatric): Secondary | ICD-10-CM | POA: Diagnosis not present

## 2021-12-28 DIAGNOSIS — G4733 Obstructive sleep apnea (adult) (pediatric): Secondary | ICD-10-CM | POA: Diagnosis not present

## 2021-12-29 DIAGNOSIS — R918 Other nonspecific abnormal finding of lung field: Secondary | ICD-10-CM | POA: Diagnosis not present

## 2021-12-29 DIAGNOSIS — R053 Chronic cough: Secondary | ICD-10-CM | POA: Diagnosis not present

## 2022-01-02 ENCOUNTER — Encounter: Payer: Self-pay | Admitting: Internal Medicine

## 2022-01-22 NOTE — Progress Notes (Unsigned)
GI Office Note    Referring Provider: Curlene Labrum, MD Primary Care Physician:  Curlene Labrum, MD  Primary Gastroenterologist: previously Dr. Laural Golden, assign to Dr. Jenetta Downer  Chief Complaint   Chief Complaint  Patient presents with   Nausea    Was having some nausea that has went away, states that she has coughing spells that she coughs up yellow junk.     History of Present Illness   Isabella Holmes is a 76 y.o. female presenting today at the request of Dr. Pleas Koch for N/V with heavy coughing.   Patient has been dealing with a chronic cough for nearly a year.  States she is seeing pulmonary, cardiology, endocrinology for thyroid.  She has sleep apnea, uses CPAP.  She tried allergy medications.  Seen by PCP back in September, reported episode of vomiting with deep coughing, referral made at that point to [place].  Patient states she has been on famotidine twice daily for a while for indigestion type symptoms.  Denies typical heartburn at any point.  She denies seeing ENT or allergy specialist.  She notes that her cough seems to be worse at home.  She notes smell at home.  She believes she may have a mold issue.  She has someone coming out to check for mold on Wednesday.  She does use an air purifier at home.  She notes a lot of wheezing and productive cough.  She denies abdominal pain.  Bowel movements are regular.  No blood in the stool or melena.  According to previous GI note from 2018, she had a colonoscopy 2008 by Dr. Laural Golden showing sigmoid diverticulosis, external hemorrhoids..     CT chest without contrast 12/2021: 1. No acute abnormality in the chest.  2. Indeterminate subpleural opacity along the medial right upper  lobe measuring up to 8 mm. Additional 4 mm nodule at the right lung  apex. Non-contrast chest CT at 3-6 months is recommended. If the  nodules are stable at time of repeat CT, then future CT at 18-24  months (from today's scan) is considered optional for  low-risk  patients, but is recommended for high-risk patients. This  recommendation follows the consensus statement: Guidelines for  Management of Incidental Pulmonary Nodules Detected on CT Images:  From the Fleischner Society 2017; Radiology 2017; 284:228-243.  3. Probable cholelithiasis.  4. Aortic Atherosclerosis (ICD10-I70.0). Coronary artery  calcifications.       Medications   Current Outpatient Medications  Medication Sig Dispense Refill   albuterol (PROVENTIL HFA;VENTOLIN HFA) 108 (90 Base) MCG/ACT inhaler Inhale 1-2 puffs into the lungs every 4 (four) hours as needed for wheezing or shortness of breath.     albuterol (PROVENTIL) (2.5 MG/3ML) 0.083% nebulizer solution Take 2.5 mg by nebulization every 6 (six) hours as needed for shortness of breath.     Cholecalciferol (VITAMIN D3) 25 MCG (1000 UT) CAPS Take 1,000 Units by mouth daily.     famotidine (PEPCID) 20 MG tablet Take 20 mg by mouth 2 (two) times daily.     FLUoxetine (PROZAC) 20 MG capsule Take 20 mg by mouth daily.     furosemide (LASIX) 40 MG tablet Take 40 mg by mouth daily as needed for fluid.     lamoTRIgine (LAMICTAL) 150 MG tablet Take 150 mg by mouth at bedtime.     levothyroxine (SYNTHROID, LEVOTHROID) 50 MCG tablet Take 50 mcg by mouth daily before breakfast.      meloxicam (MOBIC) 7.5 MG tablet  TAKE ONE TABLET BY MOUTH DAILY 30 tablet 5   Potassium 99 MG TABS Take 99 mg by mouth daily as needed (When on Lasix).     No current facility-administered medications for this visit.    Allergies   Allergies as of 01/23/2022 - Review Complete 01/23/2022  Allergen Reaction Noted   Penicillins Itching and Rash 12/17/2020   Levofloxacin Other (See Comments) 01/30/2017   Sulfa antibiotics Itching 12/17/2020   Streptomycin Rash    Sulfonamide derivatives Rash     Past Medical History   Past Medical History:  Diagnosis Date   Arthritis    Asthma    Chest pain    w abnormal ECg   Chronic headaches     migraine   COPD (chronic obstructive pulmonary disease) (HCC)    Dyslipidemia    Dyspnea    Dyspnea    GERD (gastroesophageal reflux disease)    Glucose intolerance (impaired glucose tolerance)    Hemorrhoids 03/25/2015   Hypothyroidism    Seizure disorder ([place])    on Lamictal; has not had seizure in 20 years   Seizures ([place])    Sleep apnea    lost weight and does not need CPAP any more.   Varicose veins 03/25/2015   Venous insufficiency     Past Surgical History   Past Surgical History:  Procedure Laterality Date   EXAM UNDER ANESTHESIA WITH MANIPULATION OF KNEE Right 04/05/2017   Procedure: EXAM UNDER ANESTHESIA WITH MANIPULATION OF RIGHT KNEE;  Surgeon: Carole Civil, MD;  Location: [facility];  Service: Orthopedics;  Laterality: Right;   KNEE ARTHROSCOPY Left    KNEE ARTHROSCOPY WITH MEDIAL MENISECTOMY Right 06/28/2016   Procedure: KNEE ARTHROSCOPY WITH MEDIAL MENISECTOMY;  Surgeon: Carole Civil, MD;  Location: [facility];  Service: Orthopedics;  Laterality: Right;   left vein stripping     status post bilateral tubaligation     TONSILLECTOMY     many years ago   TOTAL KNEE ARTHROPLASTY Right 02/13/2017   Procedure: TOTAL KNEE ARTHROPLASTY;  Surgeon: Carole Civil, MD;  Location: [facility];  Service: Orthopedics;  Laterality: Right;   TUBAL LIGATION      Past Family History   Family History  Problem Relation Age of Onset   Heart attack Mother    Tuberculosis Father    Heart disease Father        cardiac disease   Cirrhosis Father    Factor V Leiden deficiency Other    Seizures Son     Past Social History   Social History   Socioeconomic History   Marital status: Married    Spouse name: Not on file   Number of children: Not on file   Years of education: Not on file   Highest education level: Not on file  Occupational History   Not on file  Tobacco Use   Smoking status: Former    Packs/day: 2.50    Years: 20.00    Total pack years: 50.00     Types: Cigarettes    Quit date: 06/23/1986    Years since quitting: 35.6   Smokeless tobacco: Never  Vaping Use   Vaping Use: Never used  Substance and Sexual Activity   Alcohol use: No   Drug use: No   Sexual activity: Yes    Birth control/protection: Post-menopausal  Other Topics Concern   Not on file  Social History Narrative   Full Time, works at [employer].    Social Determinants of Health  Financial Resource Strain: Not on file  Food Insecurity: Not on file  Transportation Needs: Not on file  Physical Activity: Not on file  Stress: Not on file  Social Connections: Not on file  Intimate Partner Violence: Not on file    Review of Systems   General: Negative for anorexia, weight loss, fever, chills, fatigue, weakness. Eyes: Negative for vision changes.  ENT: Negative for hoarseness, difficulty swallowing , nasal congestion. CV: Negative for chest pain, angina, palpitations, dyspnea on exertion, peripheral edema.  Respiratory: Negative for dyspnea at rest, dyspnea on exertion, +cough, +sputum, +wheezing.  GI: See history of present illness. GU:  Negative for dysuria, hematuria, urinary incontinence, urinary frequency, nocturnal urination.  MS: Negative for joint pain, low back pain.  Derm: Negative for rash or itching.  Neuro: Negative for weakness, abnormal sensation, seizure, frequent headaches, memory loss,  confusion.  Psych: Negative for anxiety, depression, suicidal ideation, hallucinations.  Endo: Negative for unusual weight change.  Heme: Negative for bruising or bleeding. Allergy: Negative for rash or hives.  Physical Exam   BP (!) 169/79   Pulse (!) 45   Temp (!) 97.5 F (36.4 C) (Oral)   Ht '5\' 5"'$  (1.651 m)   Wt 245 lb 3.2 oz (111.2 kg)   SpO2 96%   BMI 40.80 kg/m    General: Well-nourished, well-developed in no acute distress.  Head: Normocephalic, atraumatic.   Eyes: Conjunctiva pink, no icterus. Mouth: Oropharyngeal mucosa moist and pink , no  lesions erythema or exudate. Neck: Supple without thyromegaly, masses, or lymphadenopathy.  Lungs: Clear to auscultation bilaterally.  Heart: Regular rate and rhythm, no murmurs rubs or gallops.  Abdomen: Bowel sounds are normal, nontender, nondistended, no hepatosplenomegaly or masses,  no abdominal bruits or hernia, no rebound or guarding.  Mild rectus diastasis. Rectal: not performed Extremities: No lower extremity edema. No clubbing or deformities.  Neuro: Alert and oriented x 4 , grossly normal neurologically.  Skin: Warm and dry, no rash or jaundice.   Psych: Alert and cooperative, normal mood and affect.  Labs   Lab Results  Component Value Date   CREATININE 0.90 07/29/2021   BUN 17 07/29/2021   NA 140 07/29/2021   K 3.7 07/29/2021   CL 103 07/29/2021   CO2 28 07/29/2021   Lab Results  Component Value Date   WBC 3.9 (L) 07/29/2021   HGB 14.0 07/29/2021   HCT 42.5 07/29/2021   MCV 91.0 07/29/2021   PLT 248 07/29/2021   Lab Results  Component Value Date   ALT 23 03/13/2021   AST 27 03/13/2021   ALKPHOS 137 (H) 03/13/2021   BILITOT 0.8 03/13/2021    Imaging Studies   No results found.  Assessment   Chronic cough: May be multifactorial in etiology.  Patient referred for episode of vomiting with deep coughing.  She states that this is not an ongoing issue, happened once or twice a couple months ago.  Continues to look for a cause for chronic cough, which is productive associated with shortness of breath, wheezing.  We discussed possibility of chronic cough related to LPR/GERD.  Would be reasonable to try PPI twice daily for 3 months to see if this helped her cough.  Patient has someone coming in to inspect for mold in her house and she would like to see how this goes prior to deciding to pursue PPI twice daily.  Colon cancer screening: Appears her last colonoscopy was in 2008, was due in 2018 but did not complete  at that time.  At this point she is not interested in  pursuing colonoscopy.  Chronically elevated alkaline phosphatase: She did not complete work-up in 2018 as recommended.  Alkaline phosphatase much improved from back then but still chronically elevated.  Would recommend mitochondrial antibody, liver imaging if she is agreeable.  The patient was found to have elevated blood pressure when vital signs were checked in the office. The blood pressure was rechecked by the nursing staff and it was found be persistently elevated >140/90 mmHg. I personally advised to the patient to follow up closely with his PCP for hypertension control.    PLAN   Patient to call if she wants to try PPI twice daily for 3 months. We will reach out to patient to see if she wants to go ahead and complete work-up for elevated alkaline phosphatase.   Laureen Ochs. Bobby Rumpf, [place], PA-C [facility]  I have reviewed the note and agree with the APP's assessment as described in this progress note  Patient may benefit from taking PPI twice a day and taper afterwards to once a day dosing for management of chronic cough and possible LPR symptoms.  However, if she does not present any improvement of her symptoms with this regimen, can consider doing a pH impedance testing off PPI  Maylon Peppers, MD [facility]

## 2022-01-23 ENCOUNTER — Encounter: Payer: Self-pay | Admitting: Gastroenterology

## 2022-01-23 ENCOUNTER — Ambulatory Visit (INDEPENDENT_AMBULATORY_CARE_PROVIDER_SITE_OTHER): Payer: Medicare HMO | Admitting: Gastroenterology

## 2022-01-23 ENCOUNTER — Telehealth: Payer: Self-pay | Admitting: Gastroenterology

## 2022-01-23 VITALS — BP 169/79 | HR 45 | Temp 97.5°F | Ht 65.0 in | Wt 245.2 lb

## 2022-01-23 DIAGNOSIS — R7989 Other specified abnormal findings of blood chemistry: Secondary | ICD-10-CM

## 2022-01-23 DIAGNOSIS — R053 Chronic cough: Secondary | ICD-10-CM | POA: Diagnosis not present

## 2022-01-23 NOTE — Telephone Encounter (Signed)
Please let patient know, after she left the office I reviewed her previous GI chart in more detail. She has history of chronically elevated alk phos and Dr. Olevia Perches office had plans for abdominal ultrasound to look at her liver and she also needs further labs if willing.   If willing:  Abd u/s LFTs, GGT, AMA

## 2022-01-23 NOTE — Patient Instructions (Addendum)
Please contact my CMA, Tammy at [phone] if you decide you want to try high dose acid reflux medication for 3 months to see if you have cough related acid reflux.  Please follow up with PCP regarding elevated blood pressures today.

## 2022-01-24 ENCOUNTER — Other Ambulatory Visit: Payer: Self-pay

## 2022-01-24 DIAGNOSIS — R7989 Other specified abnormal findings of blood chemistry: Secondary | ICD-10-CM

## 2022-01-24 DIAGNOSIS — R748 Abnormal levels of other serum enzymes: Secondary | ICD-10-CM

## 2022-01-24 NOTE — Telephone Encounter (Signed)
Pt was made aware and verbalized understanding. Pt is willing to do labs and [place]. Labs have been ordered, proceed with scheduling [place].

## 2022-01-24 NOTE — Addendum Note (Signed)
Addended by: Cheron Every on: 01/24/2022 10:54 AM   Modules accepted: Orders

## 2022-01-28 DIAGNOSIS — J45901 Unspecified asthma with (acute) exacerbation: Secondary | ICD-10-CM | POA: Diagnosis not present

## 2022-01-28 DIAGNOSIS — J45909 Unspecified asthma, uncomplicated: Secondary | ICD-10-CM | POA: Diagnosis not present

## 2022-01-28 DIAGNOSIS — G4733 Obstructive sleep apnea (adult) (pediatric): Secondary | ICD-10-CM | POA: Diagnosis not present

## 2022-01-28 DIAGNOSIS — Z6841 Body Mass Index (BMI) 40.0 and over, adult: Secondary | ICD-10-CM | POA: Diagnosis not present

## 2022-01-28 DIAGNOSIS — I10 Essential (primary) hypertension: Secondary | ICD-10-CM | POA: Diagnosis not present

## 2022-01-31 ENCOUNTER — Ambulatory Visit (HOSPITAL_COMMUNITY): Payer: Medicare HMO

## 2022-02-09 DIAGNOSIS — E039 Hypothyroidism, unspecified: Secondary | ICD-10-CM | POA: Diagnosis not present

## 2022-02-09 DIAGNOSIS — E785 Hyperlipidemia, unspecified: Secondary | ICD-10-CM | POA: Diagnosis not present

## 2022-02-09 DIAGNOSIS — R911 Solitary pulmonary nodule: Secondary | ICD-10-CM | POA: Diagnosis not present

## 2022-02-09 DIAGNOSIS — E7849 Other hyperlipidemia: Secondary | ICD-10-CM | POA: Diagnosis not present

## 2022-02-09 DIAGNOSIS — I10 Essential (primary) hypertension: Secondary | ICD-10-CM | POA: Diagnosis not present

## 2022-02-13 DIAGNOSIS — N1831 Chronic kidney disease, stage 3a: Secondary | ICD-10-CM | POA: Diagnosis not present

## 2022-02-13 DIAGNOSIS — G4733 Obstructive sleep apnea (adult) (pediatric): Secondary | ICD-10-CM | POA: Diagnosis not present

## 2022-02-13 DIAGNOSIS — J45909 Unspecified asthma, uncomplicated: Secondary | ICD-10-CM | POA: Diagnosis not present

## 2022-02-13 DIAGNOSIS — R69 Illness, unspecified: Secondary | ICD-10-CM | POA: Diagnosis not present

## 2022-02-13 DIAGNOSIS — R053 Chronic cough: Secondary | ICD-10-CM | POA: Diagnosis not present

## 2022-02-13 DIAGNOSIS — I700 Atherosclerosis of aorta: Secondary | ICD-10-CM | POA: Diagnosis not present

## 2022-02-13 DIAGNOSIS — E7849 Other hyperlipidemia: Secondary | ICD-10-CM | POA: Diagnosis not present

## 2022-02-13 DIAGNOSIS — R911 Solitary pulmonary nodule: Secondary | ICD-10-CM | POA: Diagnosis not present

## 2022-02-13 DIAGNOSIS — G40909 Epilepsy, unspecified, not intractable, without status epilepticus: Secondary | ICD-10-CM | POA: Diagnosis not present

## 2022-02-13 DIAGNOSIS — Z6841 Body Mass Index (BMI) 40.0 and over, adult: Secondary | ICD-10-CM | POA: Diagnosis not present

## 2022-02-13 DIAGNOSIS — I10 Essential (primary) hypertension: Secondary | ICD-10-CM | POA: Diagnosis not present

## 2022-02-14 ENCOUNTER — Ambulatory Visit (HOSPITAL_COMMUNITY)
Admission: RE | Admit: 2022-02-14 | Discharge: 2022-02-14 | Disposition: A | Discharge status: Home / Self Care | Payer: Medicare HMO | Source: Ambulatory Visit | Attending: Gastroenterology | Admitting: Gastroenterology

## 2022-02-14 DIAGNOSIS — R7989 Other specified abnormal findings of blood chemistry: Secondary | ICD-10-CM | POA: Diagnosis not present

## 2022-02-16 ENCOUNTER — Other Ambulatory Visit: Payer: Self-pay | Admitting: *Deleted

## 2022-02-16 DIAGNOSIS — R7989 Other specified abnormal findings of blood chemistry: Secondary | ICD-10-CM

## 2022-02-20 ENCOUNTER — Other Ambulatory Visit: Payer: Self-pay

## 2022-02-20 DIAGNOSIS — R7989 Other specified abnormal findings of blood chemistry: Secondary | ICD-10-CM

## 2022-02-21 DIAGNOSIS — R748 Abnormal levels of other serum enzymes: Secondary | ICD-10-CM | POA: Diagnosis not present

## 2022-02-21 DIAGNOSIS — R7989 Other specified abnormal findings of blood chemistry: Secondary | ICD-10-CM | POA: Diagnosis not present

## 2022-02-21 DIAGNOSIS — I10 Essential (primary) hypertension: Secondary | ICD-10-CM | POA: Diagnosis not present

## 2022-02-24 ENCOUNTER — Telehealth: Payer: Self-pay

## 2022-02-24 NOTE — Telephone Encounter (Signed)
Pt was made aware.  

## 2022-02-24 NOTE — Telephone Encounter (Signed)
Please let patient know that all of her labs are not back yet. Hopefully will have them by first of the week.

## 2022-02-24 NOTE — Telephone Encounter (Signed)
Pt called wanting to know results to recent lab work.

## 2022-02-27 DIAGNOSIS — G4733 Obstructive sleep apnea (adult) (pediatric): Secondary | ICD-10-CM | POA: Diagnosis not present

## 2022-02-27 LAB — BASIC METABOLIC PANEL
BUN: 14 mg/dL (ref 7–25)
CO2: 30 mmol/L (ref 20–32)
Calcium: 9.5 mg/dL (ref 8.6–10.4)
Chloride: 103 mmol/L (ref 98–110)
Creat: 0.86 mg/dL (ref 0.60–1.00)
Glucose, Bld: 92 mg/dL (ref 65–99)
Potassium: 4.2 mmol/L (ref 3.5–5.3)
Sodium: 140 mmol/L (ref 135–146)

## 2022-02-27 LAB — HEPATIC FUNCTION PANEL
AG Ratio: 1.7 (calc) (ref 1.0–2.5)
ALT: 10 U/L (ref 6–29)
AST: 15 U/L (ref 10–35)
Albumin: 4 g/dL (ref 3.6–5.1)
Alkaline phosphatase (APISO): 134 U/L (ref 37–153)
Bilirubin, Direct: 0.2 mg/dL (ref 0.0–0.2)
Globulin: 2.3 g/dL (calc) (ref 1.9–3.7)
Indirect Bilirubin: 0.7 mg/dL (calc) (ref 0.2–1.2)
Total Bilirubin: 0.9 mg/dL (ref 0.2–1.2)
Total Protein: 6.3 g/dL (ref 6.1–8.1)

## 2022-02-27 LAB — PROTIME-INR
INR: 1
Prothrombin Time: 10.5 s (ref 9.0–11.5)

## 2022-02-27 LAB — GAMMA GT: GGT: 105 U/L — ABNORMAL HIGH (ref 3–65)

## 2022-02-27 LAB — MITOCHONDRIAL ANTIBODIES: Mitochondrial M2 Ab, IgG: 149.9 U — ABNORMAL HIGH (ref <=20.0)

## 2022-02-27 LAB — AFP TUMOR MARKER: AFP-Tumor Marker: 4.4 ng/mL

## 2022-02-28 DIAGNOSIS — I10 Essential (primary) hypertension: Secondary | ICD-10-CM | POA: Diagnosis not present

## 2022-03-09 ENCOUNTER — Ambulatory Visit: Payer: Medicare Other | Admitting: Orthopaedic Surgery

## 2022-03-09 ENCOUNTER — Ambulatory Visit: Payer: Medicare Other | Admitting: Orthopedic Surgery

## 2022-03-15 ENCOUNTER — Other Ambulatory Visit: Payer: Self-pay | Admitting: Gastroenterology

## 2022-03-15 MED ORDER — URSODIOL 500 MG PO TABS: 500.0000 mg | ORAL_TABLET | Freq: Three times a day (TID) | ORAL | 11 refills | Status: DC

## 2022-03-17 ENCOUNTER — Telehealth: Payer: Self-pay

## 2022-03-17 NOTE — Telephone Encounter (Signed)
Pt called stating that the ursodiol is $132 and she is unable to afford that. Pt is wanting to know if there is a cheaper option.

## 2022-03-17 NOTE — Telephone Encounter (Signed)
There is not another medication option. I've never heard of issues with cost of ursodiol.   Can we find out more information? It appears cheaper than that on goodrx. Wonder if discount card would help her. Or different dosage.  Based on her weight, she needs the equivalent of '500mg'$  three times daily.

## 2022-03-20 NOTE — Telephone Encounter (Signed)
Spoke with pharmacy and it is her copay and that it is $143.

## 2022-03-21 NOTE — Telephone Encounter (Signed)
It is generic. It is the only option. Can she try goodrx or another prescription savings card?

## 2022-03-21 NOTE — Telephone Encounter (Signed)
Pt is wanting to know if it is something that she needs to be taking. Patient states that she doesn't understand because she isn't having any kind of symptoms.

## 2022-03-22 NOTE — Telephone Encounter (Signed)
Please let patient know we will discuss at length during upcoming ov. The medication is used to treat primary biliary cholangitis and help slow progression of disease and damage to the liver. It will be ok to hold off until 04/11/22 ov.

## 2022-03-22 NOTE — Telephone Encounter (Signed)
Pt was made aware and verbalized understanding.  

## 2022-03-30 DIAGNOSIS — G4733 Obstructive sleep apnea (adult) (pediatric): Secondary | ICD-10-CM | POA: Diagnosis not present

## 2022-04-10 ENCOUNTER — Other Ambulatory Visit: Payer: Self-pay | Admitting: Orthopedic Surgery

## 2022-04-10 DIAGNOSIS — G8929 Other chronic pain: Secondary | ICD-10-CM

## 2022-04-10 DIAGNOSIS — M1712 Unilateral primary osteoarthritis, left knee: Secondary | ICD-10-CM

## 2022-04-11 ENCOUNTER — Encounter: Payer: Self-pay | Admitting: Gastroenterology

## 2022-04-11 ENCOUNTER — Ambulatory Visit: Payer: Medicare HMO | Admitting: Gastroenterology

## 2022-04-11 VITALS — BP 124/76 | HR 73 | Temp 98.0°F | Ht 65.0 in | Wt 234.0 lb

## 2022-04-11 DIAGNOSIS — K743 Primary biliary cirrhosis: Secondary | ICD-10-CM

## 2022-04-11 NOTE — Patient Instructions (Signed)
We will be in touch once we discuss options with Isabella Holmes's regarding ursodiol.  We will continue to monitor your liver numbers closely and follow your liver by ultrasound.  Take a good multivitamin with vitamin A,D,E,K. Continue with bone density studies as scheduled.

## 2022-04-11 NOTE — Progress Notes (Addendum)
GI Office Note    Referring Provider: Curlene Labrum, MD Primary Care Physician:  Curlene Labrum, MD  Primary Gastroenterologist: Dr. Jenetta Downer  Chief Complaint   Chief Complaint  Patient presents with   Follow-up    Here to discuss ursodiol    History of Present Illness   Isabella Holmes is a 77 y.o. female presenting today for follow up of Tat Momoli. She was last seen in 01/2022 for chronic cough with vomiting. She felt like her symptoms was due to mold exposure in her house. She declined high dose PPI for possible LPR. She declined screening colonoscopy. She was noted to have chronically elevated alkaline phosphatase and she agreed to work up. She also had abnormal chest CT 12/2021 with pulmonary nodule and indeterminate subpleural opacity along medial right upper lobe advised for 3-6 month follow up CT.    Abdominal u/s 02/2022: mildly heterogeneous hepatic parenchymal echogenicity with suggestion of midly nodular contours may reflect cirrhosis.   Labs 02/2022: LFTs normal. Mitochondrial M2 Ab, IgG 149.9, GGT 105, PT/INR normal. AFT 4.4.  We advised ursodiol 500 mg TID for PBC. Unfortunately she was unable to afford the medication at $136/month.   Today: we completed visit today, her daughter Isabella Holmes joined by phone. Patient's older sister had cirrhosis. Patient states her appetite has been suppressed on new BP pill. Weight is down about 10 pounds in 3 months. She vomited yesterday. No issues with nausea today. No abdominal pain. No heartburn. BMs irregular, chronically constipated. No melena, brbpr.   Bone density study currently being scheduled by PCP.    Medications   Current Outpatient Medications  Medication Sig Dispense Refill   albuterol (PROVENTIL HFA;VENTOLIN HFA) 108 (90 Base) MCG/ACT inhaler Inhale 1-2 puffs into the lungs every 4 (four) hours as needed for wheezing or shortness of breath.     albuterol (PROVENTIL) (2.5 MG/3ML) 0.083% nebulizer solution  Take 2.5 mg by nebulization every 6 (six) hours as needed for shortness of breath.     atorvastatin (LIPITOR) 20 MG tablet Take 20 mg by mouth daily.     Cholecalciferol (VITAMIN D3) 25 MCG (1000 UT) CAPS Take 1,000 Units by mouth daily.     famotidine (PEPCID) 20 MG tablet Take 20 mg by mouth 2 (two) times daily.     FLUoxetine (PROZAC) 20 MG capsule Take 20 mg by mouth daily.     furosemide (LASIX) 40 MG tablet Take 40 mg by mouth daily as needed for fluid.     lamoTRIgine (LAMICTAL) 150 MG tablet Take 150 mg by mouth at bedtime.     levothyroxine (SYNTHROID, LEVOTHROID) 50 MCG tablet Take 50 mcg by mouth daily before breakfast.      losartan (COZAAR) 100 MG tablet Take 100 mg by mouth daily.     meloxicam (MOBIC) 7.5 MG tablet TAKE ONE TABLET BY MOUTH DAILY 30 tablet 5   pantoprazole (PROTONIX) 40 MG tablet Take 40 mg by mouth daily.     ursodiol (ACTIGALL) 500 MG tablet Take 1 tablet (500 mg total) by mouth 3 (three) times daily. With food. (Patient not taking: Reported on 04/11/2022) 90 tablet 11   No current facility-administered medications for this visit.    Allergies   Allergies as of 04/11/2022 - Review Complete 04/11/2022  Allergen Reaction Noted   Penicillins Itching and Rash 12/17/2020   Levofloxacin Other (See Comments) 01/30/2017   Sulfa antibiotics Itching 12/17/2020   Streptomycin Rash    Sulfonamide derivatives Rash  Past Medical History   Past Medical History:  Diagnosis Date   Arthritis    Asthma    Chest pain    w abnormal ECg   Chronic headaches    migraine   COPD (chronic obstructive pulmonary disease) (HCC)    Dyslipidemia    Dyspnea    Dyspnea    GERD (gastroesophageal reflux disease)    Glucose intolerance (impaired glucose tolerance)    Hemorrhoids 03/25/2015   Hypothyroidism    Seizure disorder ([place])    on Lamictal; has not had seizure in 20 years   Seizures ([place])    Sleep apnea    lost weight and does not need CPAP any more.   Varicose  veins 03/25/2015   Venous insufficiency     Past Surgical History   Past Surgical History:  Procedure Laterality Date   EXAM UNDER ANESTHESIA WITH MANIPULATION OF KNEE Right 04/05/2017   Procedure: EXAM UNDER ANESTHESIA WITH MANIPULATION OF RIGHT KNEE;  Surgeon: Carole Civil, MD;  Location: [facility];  Service: Orthopedics;  Laterality: Right;   KNEE ARTHROSCOPY Left    KNEE ARTHROSCOPY WITH MEDIAL MENISECTOMY Right 06/28/2016   Procedure: KNEE ARTHROSCOPY WITH MEDIAL MENISECTOMY;  Surgeon: Carole Civil, MD;  Location: [facility];  Service: Orthopedics;  Laterality: Right;   left vein stripping     status post bilateral tubaligation     TONSILLECTOMY     many years ago   TOTAL KNEE ARTHROPLASTY Right 02/13/2017   Procedure: TOTAL KNEE ARTHROPLASTY;  Surgeon: Carole Civil, MD;  Location: [facility];  Service: Orthopedics;  Laterality: Right;   TUBAL LIGATION      Past Family History   Family History  Problem Relation Age of Onset   Heart attack Mother    Tuberculosis Father    Heart disease Father        cardiac disease   Cirrhosis Father    Factor V Leiden deficiency Other    Seizures Son     Past Social History   Social History   Socioeconomic History   Marital status: Married    Spouse name: Not on file   Number of children: Not on file   Years of education: Not on file   Highest education level: Not on file  Occupational History   Not on file  Tobacco Use   Smoking status: Former    Packs/day: 2.50    Years: 20.00    Total pack years: 50.00    Types: Cigarettes    Quit date: 06/23/1986    Years since quitting: 35.8   Smokeless tobacco: Never  Vaping Use   Vaping Use: Never used  Substance and Sexual Activity   Alcohol use: No   Drug use: No   Sexual activity: Yes    Birth control/protection: Post-menopausal  Other Topics Concern   Not on file  Social History Narrative   Full Time, works at [employer].    Social Determinants of Health   Financial  Resource Strain: Not on file  Food Insecurity: Not on file  Transportation Needs: Not on file  Physical Activity: Not on file  Stress: Not on file  Social Connections: Not on file  Intimate Partner Violence: Not on file    Review of Systems   General: Negative for  fever, chills, fatigue, weakness. See hpi ENT: Negative for hoarseness, difficulty swallowing , nasal congestion. CV: Negative for chest pain, angina, palpitations, dyspnea on exertion, peripheral edema.  Respiratory: Negative for dyspnea at  rest, dyspnea on exertion, cough, sputum, wheezing.  GI: See history of present illness. GU:  Negative for dysuria, hematuria, urinary incontinence, urinary frequency, nocturnal urination.  Endo: Negative for unusual weight change.     Physical Exam   BP 124/76 (BP Location: Right Arm, Patient Position: Sitting, Cuff Size: Large)   Pulse 73   Temp 98 F (36.7 C) (Oral)   Ht '5\' 5"'$  (1.651 m)   Wt 234 lb (106.1 kg)   SpO2 96%   BMI 38.94 kg/m    General: Well-nourished, well-developed in no acute distress.  Eyes: No icterus. Mouth: Oropharyngeal mucosa moist and pink   Abdomen: Bowel sounds are normal, nontender, nondistended, no hepatosplenomegaly or masses,  no abdominal bruits or hernia , no rebound or guarding.  Rectal: not performed Extremities: No lower extremity edema. No clubbing or deformities. Neuro: Alert and oriented x 4   Skin: Warm and dry, no jaundice.   Psych: Alert and cooperative, normal mood and affect.  Labs   See hpi  Imaging Studies   No results found.  Assessment   PBC: recent diagnosis.  She has been unable to afford urso. We discussed current labs and imaging. She may have early cirrhosis based on suggestion of mildly nodular borders. It is not clear at this time.   Our approach to Holmes-term monitoring for patients with PBC includes  ?Liver biochemical and function tests (alanine aminotransferase [ALT], aspartate aminotransferase [AST],  alkaline phosphatase, and total bilirubin) and platelet count every three to six months. ?Vitamin A level, vitamin D level, and prothrombin time annually.   ?Thyroid-stimulating hormone annually to screen for hypothyroidism. Hypothyroidism is common in patients with PBC and may coexist at diagnosis or develop during the course of disease   ?Bone mineral densitometry every two years.  ?For patients with cirrhosis: Upper endoscopy every two to three years to screen for gastroesophageal varices. Liver ultrasound every six months to screen for hepatocellular carcinoma.      PLAN   We will touch base with [facility] to see if there is pricing differences with varying dosages of urso. Continue to monitor CBC and liver labs and recommend u/s every six months for hepatoma screening given concern for possible early cirrhosis.  Recommend supplement with vitamin A, D, E, K. Bone density study every two years, states she is scheduled with PCP.  She will also need follow up Chest CT for abnormalities seen on CT in 12/2021.    Laureen Ochs. Bobby Rumpf, [place], PA-C [facility]  I have reviewed the note and agree with the APP's assessment as described in this progress note  Unfortunate case of newly diagnosed PBC for which she has not been on any treatment and is not able to afford medication at therapeutic dose.  Will resume alkaline phosphatase was at "goal" but total bilirubin was more than 0.6 which is not ideal.  Patient is to be on standing medication for her PBC.  Hopefully she can get a lower price version of URSO. She had ultrasound changes that were suggestive of early cirrhosis.  Will need to do a liver elastography upon follow-up in 6 months.  We will treat her as a cirrhotic patient for now given the changes on imaging.  Maylon Peppers, MD Gastroenterology and Hepatology [facility]

## 2022-04-24 ENCOUNTER — Telehealth: Payer: Self-pay

## 2022-04-24 NOTE — Telephone Encounter (Signed)
Pt called to see if you had spoke to [place] regarding ursodiol. Pt is aware that you are out of the office until tomorrow.

## 2022-04-25 NOTE — Telephone Encounter (Signed)
I have called [facility]. They can order from a different supplier and run as cash pay (not using her insurance) and get the price down to $69/month. Not really any cheaper to use 360m vs [id number].   If that is still not affordable, the only other options is to try a different pharmacy to see if their cash pay price would be cheaper.   Her copay is $143/month when using insurance.

## 2022-04-26 NOTE — Telephone Encounter (Signed)
Pt was made aware and verbalized understanding.  

## 2022-04-27 NOTE — Telephone Encounter (Signed)
Lmom for pt to return my call.  

## 2022-04-27 NOTE — Telephone Encounter (Signed)
She needs labs in four months:CBC, LFTs, Basic met panel, PT/INR, AFP in 4 months.  Needs OV with Dr. Jenetta Downer in 4 months.  She is due for follow up CT chest without contrast for "Indeterminate subpleural opacity along the medial right upper lobe measuring up to 8 mm. Additional 4 mm nodule at the right lung apex"  HER PCP ORDERED THE ONE IN 12/2021 AND RADIOLOGIST ADVISED 3-6 MONTH FOLLOW UP. PLEASE HAVE HER FOLLOW UP WITH PCP TO GET THIS DONE.

## 2022-04-30 DIAGNOSIS — G4733 Obstructive sleep apnea (adult) (pediatric): Secondary | ICD-10-CM | POA: Diagnosis not present

## 2022-05-01 ENCOUNTER — Other Ambulatory Visit: Payer: Self-pay

## 2022-05-01 DIAGNOSIS — R7989 Other specified abnormal findings of blood chemistry: Secondary | ICD-10-CM

## 2022-05-01 DIAGNOSIS — K743 Primary biliary cirrhosis: Secondary | ICD-10-CM

## 2022-05-01 DIAGNOSIS — R748 Abnormal levels of other serum enzymes: Secondary | ICD-10-CM

## 2022-05-01 NOTE — Telephone Encounter (Signed)
Pt was made aware and verbalized understanding. Labs were ordered and will be mailed to the pt to have completed in 4 mths.   [facility]: please schedule pt an appt with Dr. Jenetta Downer in 4 mths after 08/30/2022.

## 2022-05-10 DIAGNOSIS — E559 Vitamin D deficiency, unspecified: Secondary | ICD-10-CM | POA: Diagnosis not present

## 2022-05-10 DIAGNOSIS — K219 Gastro-esophageal reflux disease without esophagitis: Secondary | ICD-10-CM | POA: Diagnosis not present

## 2022-05-10 DIAGNOSIS — I10 Essential (primary) hypertension: Secondary | ICD-10-CM | POA: Diagnosis not present

## 2022-05-10 DIAGNOSIS — E7849 Other hyperlipidemia: Secondary | ICD-10-CM | POA: Diagnosis not present

## 2022-05-10 DIAGNOSIS — D519 Vitamin B12 deficiency anemia, unspecified: Secondary | ICD-10-CM | POA: Diagnosis not present

## 2022-05-11 ENCOUNTER — Encounter: Payer: Self-pay | Admitting: Radiology

## 2022-05-16 DIAGNOSIS — I700 Atherosclerosis of aorta: Secondary | ICD-10-CM | POA: Diagnosis not present

## 2022-05-16 DIAGNOSIS — G40909 Epilepsy, unspecified, not intractable, without status epilepticus: Secondary | ICD-10-CM | POA: Diagnosis not present

## 2022-05-16 DIAGNOSIS — R911 Solitary pulmonary nodule: Secondary | ICD-10-CM | POA: Diagnosis not present

## 2022-05-16 DIAGNOSIS — J45909 Unspecified asthma, uncomplicated: Secondary | ICD-10-CM | POA: Diagnosis not present

## 2022-05-16 DIAGNOSIS — I5030 Unspecified diastolic (congestive) heart failure: Secondary | ICD-10-CM | POA: Diagnosis not present

## 2022-05-16 DIAGNOSIS — G4733 Obstructive sleep apnea (adult) (pediatric): Secondary | ICD-10-CM | POA: Diagnosis not present

## 2022-05-16 DIAGNOSIS — K743 Primary biliary cirrhosis: Secondary | ICD-10-CM | POA: Diagnosis not present

## 2022-05-16 DIAGNOSIS — E7849 Other hyperlipidemia: Secondary | ICD-10-CM | POA: Diagnosis not present

## 2022-05-16 DIAGNOSIS — R69 Illness, unspecified: Secondary | ICD-10-CM | POA: Diagnosis not present

## 2022-05-16 DIAGNOSIS — I10 Essential (primary) hypertension: Secondary | ICD-10-CM | POA: Diagnosis not present

## 2022-05-16 DIAGNOSIS — N1831 Chronic kidney disease, stage 3a: Secondary | ICD-10-CM | POA: Diagnosis not present

## 2022-05-29 DIAGNOSIS — G4733 Obstructive sleep apnea (adult) (pediatric): Secondary | ICD-10-CM | POA: Diagnosis not present

## 2022-06-12 DIAGNOSIS — R911 Solitary pulmonary nodule: Secondary | ICD-10-CM | POA: Diagnosis not present

## 2022-06-12 DIAGNOSIS — I700 Atherosclerosis of aorta: Secondary | ICD-10-CM | POA: Diagnosis not present

## 2022-06-12 DIAGNOSIS — I2510 Atherosclerotic heart disease of native coronary artery without angina pectoris: Secondary | ICD-10-CM | POA: Diagnosis not present

## 2022-06-12 DIAGNOSIS — K8020 Calculus of gallbladder without cholecystitis without obstruction: Secondary | ICD-10-CM | POA: Diagnosis not present

## 2022-06-29 DIAGNOSIS — G4733 Obstructive sleep apnea (adult) (pediatric): Secondary | ICD-10-CM | POA: Diagnosis not present

## 2022-07-20 DIAGNOSIS — Z6837 Body mass index (BMI) 37.0-37.9, adult: Secondary | ICD-10-CM | POA: Diagnosis not present

## 2022-07-20 DIAGNOSIS — R030 Elevated blood-pressure reading, without diagnosis of hypertension: Secondary | ICD-10-CM | POA: Diagnosis not present

## 2022-07-20 DIAGNOSIS — J45901 Unspecified asthma with (acute) exacerbation: Secondary | ICD-10-CM | POA: Diagnosis not present

## 2022-07-20 DIAGNOSIS — J069 Acute upper respiratory infection, unspecified: Secondary | ICD-10-CM | POA: Diagnosis not present

## 2022-07-29 DIAGNOSIS — G4733 Obstructive sleep apnea (adult) (pediatric): Secondary | ICD-10-CM | POA: Diagnosis not present

## 2022-08-21 ENCOUNTER — Other Ambulatory Visit: Payer: Self-pay

## 2022-08-21 DIAGNOSIS — K743 Primary biliary cirrhosis: Secondary | ICD-10-CM

## 2022-08-21 DIAGNOSIS — R7989 Other specified abnormal findings of blood chemistry: Secondary | ICD-10-CM

## 2022-08-21 DIAGNOSIS — R748 Abnormal levels of other serum enzymes: Secondary | ICD-10-CM

## 2022-08-23 DIAGNOSIS — I10 Essential (primary) hypertension: Secondary | ICD-10-CM | POA: Diagnosis not present

## 2022-08-23 DIAGNOSIS — N1831 Chronic kidney disease, stage 3a: Secondary | ICD-10-CM | POA: Diagnosis not present

## 2022-08-23 DIAGNOSIS — E039 Hypothyroidism, unspecified: Secondary | ICD-10-CM | POA: Diagnosis not present

## 2022-08-29 DIAGNOSIS — G4733 Obstructive sleep apnea (adult) (pediatric): Secondary | ICD-10-CM | POA: Diagnosis not present

## 2022-08-30 ENCOUNTER — Inpatient Hospital Stay: Payer: Medicare HMO | Admitting: Gastroenterology

## 2022-08-30 DIAGNOSIS — E7849 Other hyperlipidemia: Secondary | ICD-10-CM | POA: Diagnosis not present

## 2022-08-30 DIAGNOSIS — K743 Primary biliary cirrhosis: Secondary | ICD-10-CM | POA: Diagnosis not present

## 2022-08-30 DIAGNOSIS — N1831 Chronic kidney disease, stage 3a: Secondary | ICD-10-CM | POA: Diagnosis not present

## 2022-08-30 DIAGNOSIS — G4733 Obstructive sleep apnea (adult) (pediatric): Secondary | ICD-10-CM | POA: Diagnosis not present

## 2022-08-30 DIAGNOSIS — I700 Atherosclerosis of aorta: Secondary | ICD-10-CM | POA: Diagnosis not present

## 2022-08-30 DIAGNOSIS — Z6839 Body mass index (BMI) 39.0-39.9, adult: Secondary | ICD-10-CM | POA: Diagnosis not present

## 2022-08-30 DIAGNOSIS — F322 Major depressive disorder, single episode, severe without psychotic features: Secondary | ICD-10-CM | POA: Diagnosis not present

## 2022-08-30 DIAGNOSIS — I10 Essential (primary) hypertension: Secondary | ICD-10-CM | POA: Diagnosis not present

## 2022-08-30 DIAGNOSIS — J4540 Moderate persistent asthma, uncomplicated: Secondary | ICD-10-CM | POA: Diagnosis not present

## 2022-08-30 DIAGNOSIS — I5030 Unspecified diastolic (congestive) heart failure: Secondary | ICD-10-CM | POA: Diagnosis not present

## 2022-08-30 DIAGNOSIS — G40909 Epilepsy, unspecified, not intractable, without status epilepticus: Secondary | ICD-10-CM | POA: Diagnosis not present

## 2022-09-04 DIAGNOSIS — K743 Primary biliary cirrhosis: Secondary | ICD-10-CM | POA: Diagnosis not present

## 2022-09-04 DIAGNOSIS — R7989 Other specified abnormal findings of blood chemistry: Secondary | ICD-10-CM | POA: Diagnosis not present

## 2022-09-04 DIAGNOSIS — K7460 Unspecified cirrhosis of liver: Secondary | ICD-10-CM | POA: Diagnosis not present

## 2022-09-04 DIAGNOSIS — R748 Abnormal levels of other serum enzymes: Secondary | ICD-10-CM | POA: Diagnosis not present

## 2022-09-06 ENCOUNTER — Other Ambulatory Visit: Payer: Self-pay | Admitting: Gastroenterology

## 2022-09-06 LAB — BASIC METABOLIC PANEL
BUN/Creatinine Ratio: 20 (calc) (ref 6–22)
BUN: 22 mg/dL (ref 7–25)
CO2: 27 mmol/L (ref 20–32)
Calcium: 9.5 mg/dL (ref 8.6–10.4)
Chloride: 103 mmol/L (ref 98–110)
Creat: 1.08 mg/dL — ABNORMAL HIGH (ref 0.60–1.00)
Glucose, Bld: 93 mg/dL (ref 65–139)
Potassium: 4.3 mmol/L (ref 3.5–5.3)
Sodium: 140 mmol/L (ref 135–146)

## 2022-09-06 LAB — HEPATIC FUNCTION PANEL
AG Ratio: 1.9 (calc) (ref 1.0–2.5)
ALT: 11 U/L (ref 6–29)
AST: 17 U/L (ref 10–35)
Albumin: 4.1 g/dL (ref 3.6–5.1)
Alkaline phosphatase (APISO): 107 U/L (ref 37–153)
Bilirubin, Direct: 0.1 mg/dL (ref 0.0–0.2)
Globulin: 2.2 g/dL (calc) (ref 1.9–3.7)
Indirect Bilirubin: 0.6 mg/dL (calc) (ref 0.2–1.2)
Total Bilirubin: 0.7 mg/dL (ref 0.2–1.2)
Total Protein: 6.3 g/dL (ref 6.1–8.1)

## 2022-09-06 LAB — PROTIME-INR
INR: 1
Prothrombin Time: 10.3 s (ref 9.0–11.5)

## 2022-09-06 LAB — CBC WITH DIFFERENTIAL/PLATELET
Absolute Monocytes: 461 cells/uL (ref 200–950)
Basophils Absolute: 28 cells/uL (ref 0–200)
Basophils Relative: 0.6 %
Eosinophils Absolute: 122 cells/uL (ref 15–500)
Eosinophils Relative: 2.6 %
HCT: 36.8 % (ref 35.0–45.0)
Hemoglobin: 12.3 g/dL (ref 11.7–15.5)
Lymphs Abs: 1626 cells/uL (ref 850–3900)
MCH: 30.4 pg (ref 27.0–33.0)
MCHC: 33.4 g/dL (ref 32.0–36.0)
MCV: 90.9 fL (ref 80.0–100.0)
MPV: 10.4 fL (ref 7.5–12.5)
Monocytes Relative: 9.8 %
Neutro Abs: 2463 cells/uL (ref 1500–7800)
Neutrophils Relative %: 52.4 %
Platelets: 243 10*3/uL (ref 140–400)
RBC: 4.05 10*6/uL (ref 3.80–5.10)
RDW: 12.4 % (ref 11.0–15.0)
Total Lymphocyte: 34.6 %
WBC: 4.7 10*3/uL (ref 3.8–10.8)

## 2022-09-06 LAB — AFP TUMOR MARKER: AFP-Tumor Marker: 5 ng/mL

## 2022-09-06 MED ORDER — URSODIOL 500 MG PO TABS: 500.0000 mg | ORAL_TABLET | Freq: Three times a day (TID) | ORAL | 11 refills | Status: DC

## 2022-09-11 ENCOUNTER — Ambulatory Visit (INDEPENDENT_AMBULATORY_CARE_PROVIDER_SITE_OTHER): Payer: Medicare HMO | Admitting: Gastroenterology

## 2022-09-11 ENCOUNTER — Encounter (INDEPENDENT_AMBULATORY_CARE_PROVIDER_SITE_OTHER): Payer: Self-pay | Admitting: Gastroenterology

## 2022-09-11 VITALS — BP 115/68 | HR 75 | Temp 98.1°F | Ht 65.0 in | Wt 239.8 lb

## 2022-09-11 DIAGNOSIS — K743 Primary biliary cirrhosis: Secondary | ICD-10-CM

## 2022-09-11 DIAGNOSIS — K219 Gastro-esophageal reflux disease without esophagitis: Secondary | ICD-10-CM

## 2022-09-11 MED ORDER — URSODIOL 500 MG PO TABS: 500.0000 mg | ORAL_TABLET | Freq: Three times a day (TID) | ORAL | 11 refills | Status: DC

## 2022-09-11 NOTE — Patient Instructions (Addendum)
We need to continue with ursodiol 500mg , please take this three times per day We will update Liver US and bone density scan I will be in touch once I have the results of liver US, as discussed, we can schedule colonoscopy for screening and may also schedule upper endoscopy at that time as well Continue your protonix 40mg  daily and pepcid 20mg  twice daily  Follow up 6 months

## 2022-09-11 NOTE — Progress Notes (Addendum)
Referring Provider: Juliette Alcide, MD Primary Care Physician:  Juliette Alcide, MD Primary GI Physician: Levon Hedger   Chief Complaint  Patient presents with   PBC    Follow up on Primary biliary cholangitis. Reports her stools are more formed now. Does have some abdominal cramping.    Gastroesophageal Reflux    Follow up on GERD. Reports doing well with protonix and pepcid.    HPI:   Isabella [facility] is a 77 y.o. female with past medical history of arthritis, asthma, COPD, dyslipidemia, GERD, PBC, seizures, Sleep apnea  Patient presenting today for follow up of PBC and GERD  Last seen January 2024, at that time weight down 10 pounds, had some vomiting. BMs irregular with some constipation.   As there had been concerns for early cirrhosis, advised to have CBC, MELD labs, [place] q6 months, continue urso  [place] in December 2023 with Mildly heterogeneous hepatic parenchymal echogenicity with suggestion of mildly nodular contours. Findings may reflect underlying liver disease or cirrhosis in the appropriate clinical setting.  Labs in June 2024 with normal AFP, CBC, BMP, INR, Alk Phos 107, T bili above goal at 0.6, advised to continue with Urso (appears per chart review, patient not taking compliantly)  Present:  She notes she is taking urso but only twice a day as it is very expensive and she usually cannot remember to take her third dose. She notes she has some stomach cramping at times when she takes it, previously some looser stools but this has improved. She notes that she is now getting her urso at [facility], previously was $79 at [facility], she has not gotten it filled at new pharmacy yet to see how much it is. Has not had DEXA scan scheduled with PCP yet.    GERD is doing well on Protonix 40mg  daily and pepcid in the evenings.   Per chart review, she had declined repeat screening colonoscopy in the past but is now interested in getting this scheduled   Last Colonoscopy: 2008 Last  Endoscopy: never  Recommendations:    Past Medical History:  Diagnosis Date   Arthritis    Asthma    Chest pain    w abnormal ECg   Chronic headaches    migraine   COPD (chronic obstructive pulmonary disease) (HCC)    Dyslipidemia    Dyspnea    Dyspnea    GERD (gastroesophageal reflux disease)    Glucose intolerance (impaired glucose tolerance)    Hemorrhoids 03/25/2015   Hypothyroidism    Seizure disorder (HCC)    on Lamictal; has not had seizure in 20 years   Seizures (HCC)    Sleep apnea    lost weight and does not need CPAP any more.   Varicose veins 03/25/2015   Venous insufficiency     Past Surgical History:  Procedure Laterality Date   EXAM UNDER ANESTHESIA WITH MANIPULATION OF KNEE Right 04/05/2017   Procedure: EXAM UNDER ANESTHESIA WITH MANIPULATION OF RIGHT KNEE;  Surgeon: Vickki Hearing, MD;  Location: [facility];  Service: Orthopedics;  Laterality: Right;   KNEE ARTHROSCOPY Left    KNEE ARTHROSCOPY WITH MEDIAL MENISECTOMY Right 06/28/2016   Procedure: KNEE ARTHROSCOPY WITH MEDIAL MENISECTOMY;  Surgeon: Vickki Hearing, MD;  Location: [facility];  Service: Orthopedics;  Laterality: Right;   left vein stripping     status post bilateral tubaligation     TONSILLECTOMY     many years ago   TOTAL KNEE ARTHROPLASTY Right 02/13/2017  Procedure: TOTAL KNEE ARTHROPLASTY;  Surgeon: Vickki Hearing, MD;  Location: [facility];  Service: Orthopedics;  Laterality: Right;   TUBAL LIGATION      Current Outpatient Medications  Medication Sig Dispense Refill   albuterol (PROVENTIL HFA;VENTOLIN HFA) 108 (90 Base) MCG/ACT inhaler Inhale 1-2 puffs into the lungs every 4 (four) hours as needed for wheezing or shortness of breath.     albuterol (PROVENTIL) (2.5 MG/3ML) 0.083% nebulizer solution Take 2.5 mg by nebulization every 6 (six) hours as needed for shortness of breath.     atorvastatin (LIPITOR) 20 MG tablet Take 20 mg by mouth daily.     famotidine (PEPCID) 20 MG tablet  Take 20 mg by mouth 2 (two) times daily.     FLUoxetine (PROZAC) 20 MG capsule Take 20 mg by mouth daily.     furosemide (LASIX) 40 MG tablet Take 40 mg by mouth daily as needed for fluid.     lamoTRIgine (LAMICTAL) 150 MG tablet Take 150 mg by mouth at bedtime.     levothyroxine (SYNTHROID, LEVOTHROID) 50 MCG tablet Take 50 mcg by mouth daily before breakfast.      losartan (COZAAR) 100 MG tablet Take 100 mg by mouth daily.     meloxicam (MOBIC) 7.5 MG tablet TAKE ONE TABLET BY MOUTH DAILY 30 tablet 5   Multiple Vitamin (MULTIVITAMIN) tablet Take 1 tablet by mouth daily.     pantoprazole (PROTONIX) 40 MG tablet Take 40 mg by mouth daily.     ursodiol (ACTIGALL) 500 MG tablet Take 1 tablet (500 mg total) by mouth 3 (three) times daily. With food. 90 tablet 11   No current facility-administered medications for this visit.    Allergies as of 09/11/2022 - Review Complete 09/11/2022  Allergen Reaction Noted   Penicillins Itching and Rash 12/17/2020   Levofloxacin Other (See Comments) 01/30/2017   Sulfa antibiotics Itching 12/17/2020   Streptomycin Rash    Sulfonamide derivatives Rash     Family History  Problem Relation Age of Onset   Heart attack Mother    Tuberculosis Father    Heart disease Father        cardiac disease   Cirrhosis Father    Factor V Leiden deficiency Other    Seizures Son     Social History   Socioeconomic History   Marital status: Married    Spouse name: Not on file   Number of children: Not on file   Years of education: Not on file   Highest education level: Not on file  Occupational History   Not on file  Tobacco Use   Smoking status: Former    Packs/day: 2.50    Years: 20.00    Additional pack years: 0.00    Total pack years: 50.00    Types: Cigarettes    Quit date: 06/23/1986    Years since quitting: 36.2    Passive exposure: Past   Smokeless tobacco: Never  Vaping Use   Vaping Use: Never used  Substance and Sexual Activity   Alcohol use:  No   Drug use: No   Sexual activity: Yes    Birth control/protection: Post-menopausal  Other Topics Concern   Not on file  Social History Narrative   Full Time, works at [employer].    Social Determinants of Health   Financial Resource Strain: Not on file  Food Insecurity: Not on file  Transportation Needs: Not on file  Physical Activity: Not on file  Stress: Not on file  Social Connections: Not on file   Review of systems General: negative for malaise, night sweats, fever, chills, weight loss Neck: Negative for lumps, goiter, pain and significant neck swelling Resp: Negative for cough, wheezing, dyspnea at rest CV: Negative for chest pain, leg swelling, palpitations, orthopnea GI: denies melena, hematochezia, nausea, vomiting, diarrhea, constipation, dysphagia, odyonophagia, early satiety or unintentional weight loss. +abdominal cramping  MSK: Negative for joint pain or swelling, back pain, and muscle pain. Derm: Negative for itching or rash Psych: Denies depression, anxiety, memory loss, confusion. No homicidal or suicidal ideation.  Heme: Negative for prolonged bleeding, bruising easily, and swollen nodes. Endocrine: Negative for cold or heat intolerance, polyuria, polydipsia and goiter. Neuro: negative for tremor, gait imbalance, syncope and seizures. The remainder of the review of systems is noncontributory.  Physical Exam: BP 115/68 (BP Location: Left Arm, Patient Position: Sitting, Cuff Size: Large)   Pulse 75   Temp 98.1 F (36.7 C) (Oral)   Ht 5\' 5"  (1.651 m)   Wt 239 lb 12.8 oz (108.8 kg)   BMI 39.90 kg/m  General:   Alert and oriented. No distress noted. Pleasant and cooperative.  Head:  Normocephalic and atraumatic. Eyes:  Conjuctiva clear without scleral icterus. Mouth:  Oral mucosa pink and moist. Good dentition. No lesions. Heart: Normal rate and rhythm, s1 and s2 heart sounds present.  Lungs: Clear lung sounds in all lobes. Respirations equal and  unlabored. Abdomen:  +BS, soft, non-tender and non-distended. No rebound or guarding. No HSM or masses noted. Derm: No palmar erythema or jaundice Msk:  Symmetrical without gross deformities. Normal posture. Extremities:  Without edema. Neurologic:  Alert and  oriented x4 Psych:  Alert and cooperative. Normal mood and affect.  Invalid input(s): "6 MONTHS"   ASSESSMENT: LISSANDRA GRADOWSKI is a 77 y.o. female presenting today for follow up of PBC and GERD.  GERD: well managed on protonix 40mg  daily and pepcid 20mg  BID. Denies breakthrough symptoms. Will continue with current regimen.   PBC/Questionable cirrhosis: PBC newly diagnosed in January 2024 after she presented with ongoing elevated Alk Phos, mitochondrial Ab checked in December 2023, elevated to 149.9, GGT 105. US of the liver at that time also concerning for underlying cirrhosis. Recommended to continue with every 6 month evaluations via labs and Korea for presumed cirrhosis and Urso 500mg  TID for PBC. Recent labs in June with AP of 107, T bili slightly above goal at 0.6. MELD 3.0 is 8. she has not taken urso TID on regular basis since diagnosis, typically taking only BID due to cost of med and forgetting her third dose. We discussed her labs and importance of TID dosing for adequate control of her disease. She will let me know if medication is too expensive when I send the refill to her new pharmacy. Will update US of the liver with elastography. She will be due for repeat labs again in December 2024.   As she is amenable to proceeding with updating screening colonoscopy which we will get her scheduled for this. Platelet count in June was >150k so she does not need EGD for esophageal variceal screening at this time. As it is recommended to undergo DEXA scan every 2 years in presence of PBC, Will also schedule this since it was not done by her PCP as previously planned.    PLAN:  US liver elastography  2. Continue with Urso 500mg  TID 3.  Repeat labs in December 2024  4. Schedule screening colonoscopy-ASA III 5.Continue with protonix 40mg   daily and Pepcid 20mg  BID 6. Will schedule DEXA scan.   All questions were answered, patient verbalized understanding and is in agreement with plan as outlined above.    Follow Up: 6 months   Zamir Staples L. Jeanmarie Hubert, MSN, APRN, AGNP-C Adult-Gerontology Nurse Practitioner [facility]  I have reviewed the note and agree with the APP's assessment as described in this progress note  If elastography is not conclusive, will order ELF test in a year to noninvasively evaluate liver fibrosis as well.  Katrinka Blazing, MD Gastroenterology and Hepatology [facility]

## 2022-09-13 ENCOUNTER — Other Ambulatory Visit (INDEPENDENT_AMBULATORY_CARE_PROVIDER_SITE_OTHER): Payer: Self-pay | Admitting: *Deleted

## 2022-09-13 MED ORDER — URSODIOL 500 MG PO TABS: 500.0000 mg | ORAL_TABLET | Freq: Three times a day (TID) | ORAL | 11 refills | Status: DC

## 2022-09-18 DIAGNOSIS — Z20828 Contact with and (suspected) exposure to other viral communicable diseases: Secondary | ICD-10-CM | POA: Diagnosis not present

## 2022-09-18 DIAGNOSIS — J0101 Acute recurrent maxillary sinusitis: Secondary | ICD-10-CM | POA: Diagnosis not present

## 2022-09-19 ENCOUNTER — Telehealth (INDEPENDENT_AMBULATORY_CARE_PROVIDER_SITE_OTHER): Payer: Self-pay | Admitting: Gastroenterology

## 2022-09-19 ENCOUNTER — Other Ambulatory Visit (INDEPENDENT_AMBULATORY_CARE_PROVIDER_SITE_OTHER): Payer: Self-pay | Admitting: Gastroenterology

## 2022-09-19 DIAGNOSIS — K743 Primary biliary cirrhosis: Secondary | ICD-10-CM

## 2022-09-19 DIAGNOSIS — Z13820 Encounter for screening for osteoporosis: Secondary | ICD-10-CM

## 2022-09-19 NOTE — Telephone Encounter (Signed)
Yes ma'am; they said that diagnosis would not pass medical necessity.

## 2022-09-19 NOTE — Telephone Encounter (Signed)
[facility] calling to get an updated diagnosis code for pt bone density scan. They will need a new order faxed over with updated code. Please advise. Thank you Fax [phone]

## 2022-09-19 NOTE — Telephone Encounter (Signed)
I just got a fax that stated [facility] doesn't do the Korea that was ordered. I will call her to see if she is ok with going to [facility]. However they do they Bone Density.

## 2022-09-26 ENCOUNTER — Ambulatory Visit (HOSPITAL_COMMUNITY)
Admission: RE | Admit: 2022-09-26 | Discharge: 2022-09-26 | Disposition: A | Discharge status: Home / Self Care | Payer: Medicare HMO | Source: Ambulatory Visit | Attending: Gastroenterology | Admitting: Gastroenterology

## 2022-09-26 DIAGNOSIS — Z13820 Encounter for screening for osteoporosis: Secondary | ICD-10-CM | POA: Insufficient documentation

## 2022-09-26 DIAGNOSIS — M8588 Other specified disorders of bone density and structure, other site: Secondary | ICD-10-CM | POA: Insufficient documentation

## 2022-09-26 DIAGNOSIS — K743 Primary biliary cirrhosis: Secondary | ICD-10-CM | POA: Diagnosis not present

## 2022-09-26 DIAGNOSIS — M069 Rheumatoid arthritis, unspecified: Secondary | ICD-10-CM | POA: Diagnosis not present

## 2022-09-28 ENCOUNTER — Telehealth (INDEPENDENT_AMBULATORY_CARE_PROVIDER_SITE_OTHER): Payer: Self-pay | Admitting: *Deleted

## 2022-09-28 DIAGNOSIS — G4733 Obstructive sleep apnea (adult) (pediatric): Secondary | ICD-10-CM | POA: Diagnosis not present

## 2022-09-28 NOTE — Telephone Encounter (Signed)
Patient states she never got a call from [facility] about endoscopy and colonoscopy. She states she requested to do them there. I let her know our providers would only do them in [place] and I saw a note about an ultrasound in her chart not being able to do at [facility] she is ok with doing that at [facility]. ( I did not see where she needed endoscopy in the office note but pt thought she was getting one )

## 2022-09-28 NOTE — Telephone Encounter (Signed)
[facility] pt wanted to do Korea at [facility] instead of [facility] and I let her know colonosocpy had to be done at [facility]

## 2022-10-02 NOTE — Telephone Encounter (Signed)
Left message to return call 

## 2022-10-07 IMAGING — DX DG CHEST 2V
2 series · 2 of 2 positions shown · non-contrast
Comparison: Radiographs 12/17/2020 and 11/26/2020.

CLINICAL DATA: Shortness of breath. History of congestive heart
failure with recent hospitalization.

EXAM:
CHEST - 2 VIEW

[w chest pa]
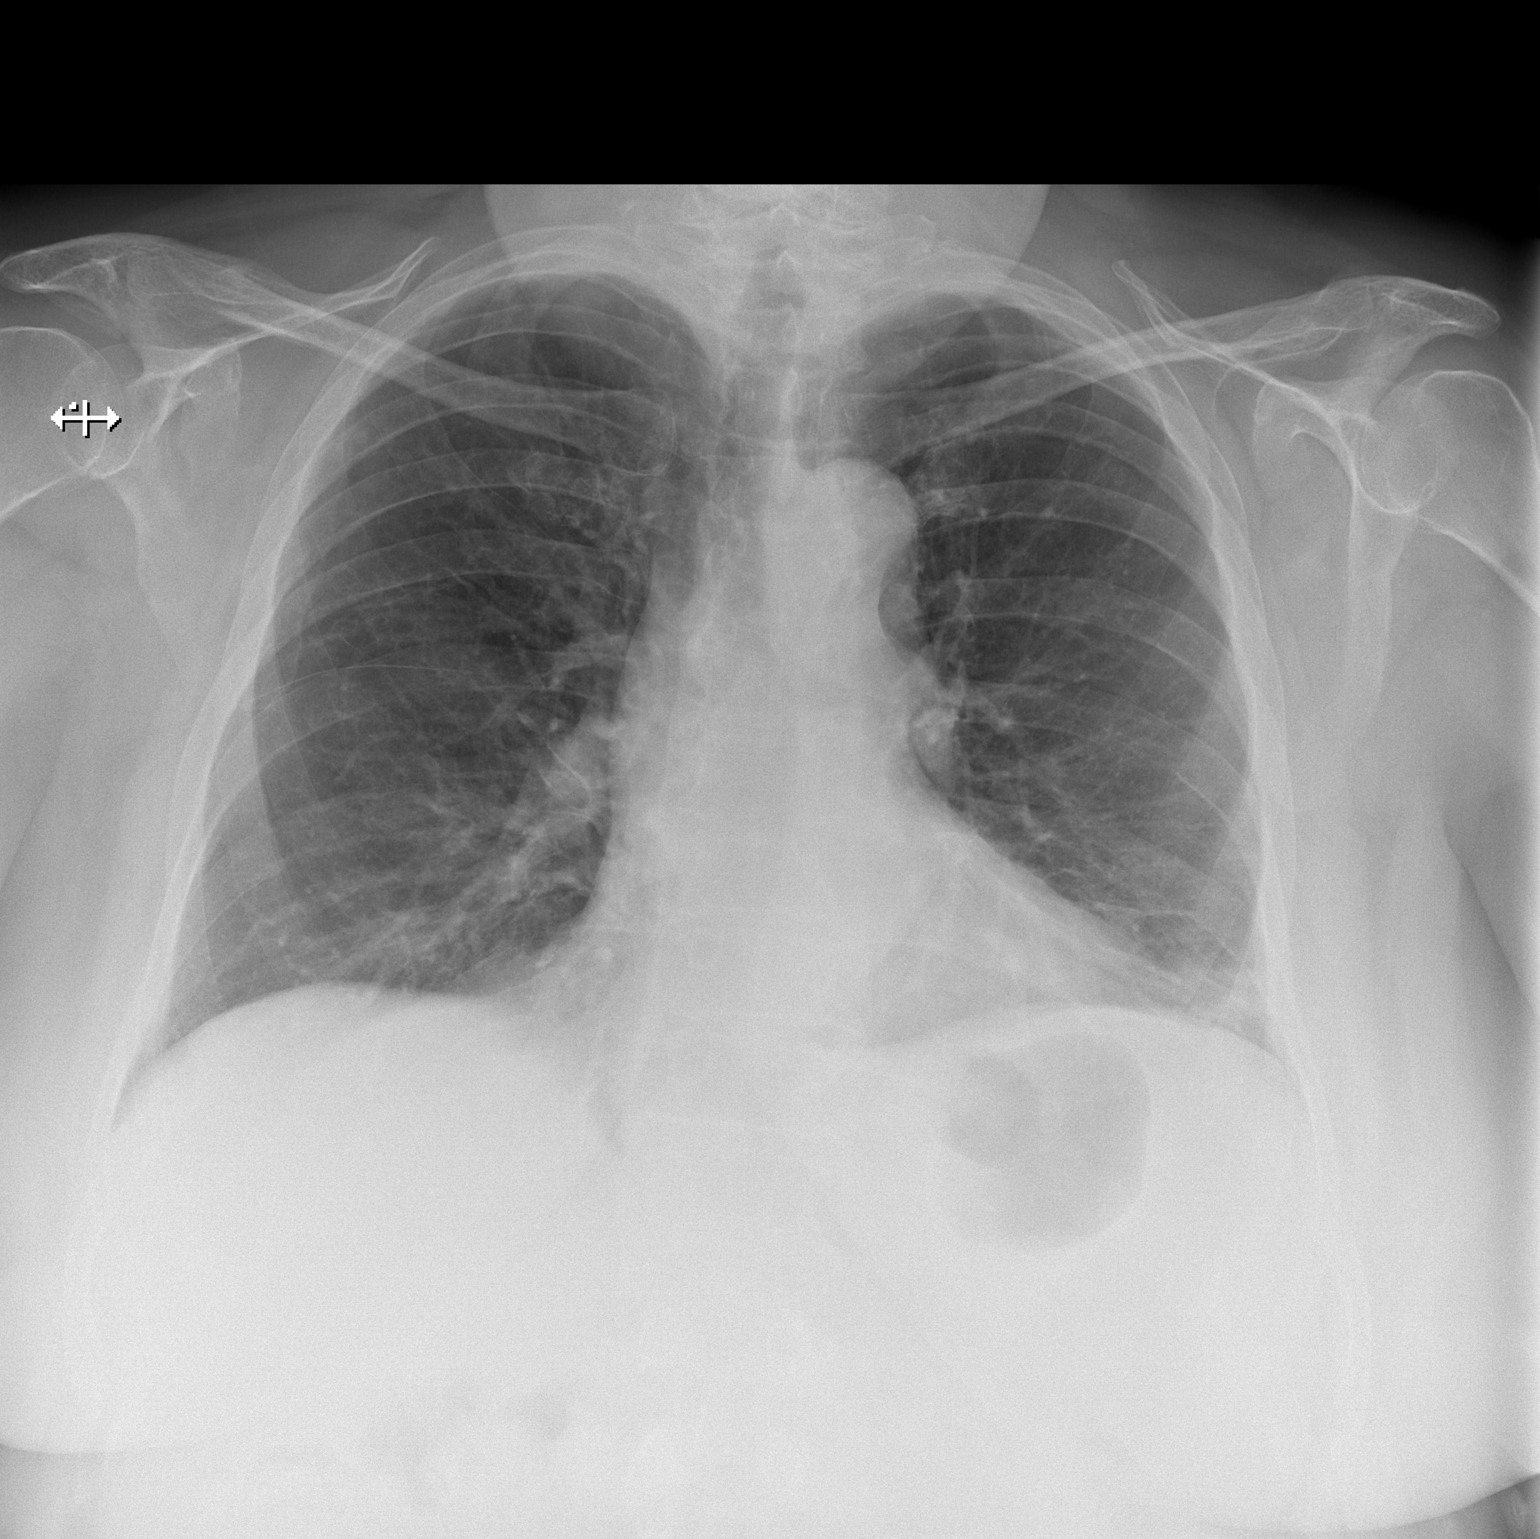

[w chest lat]
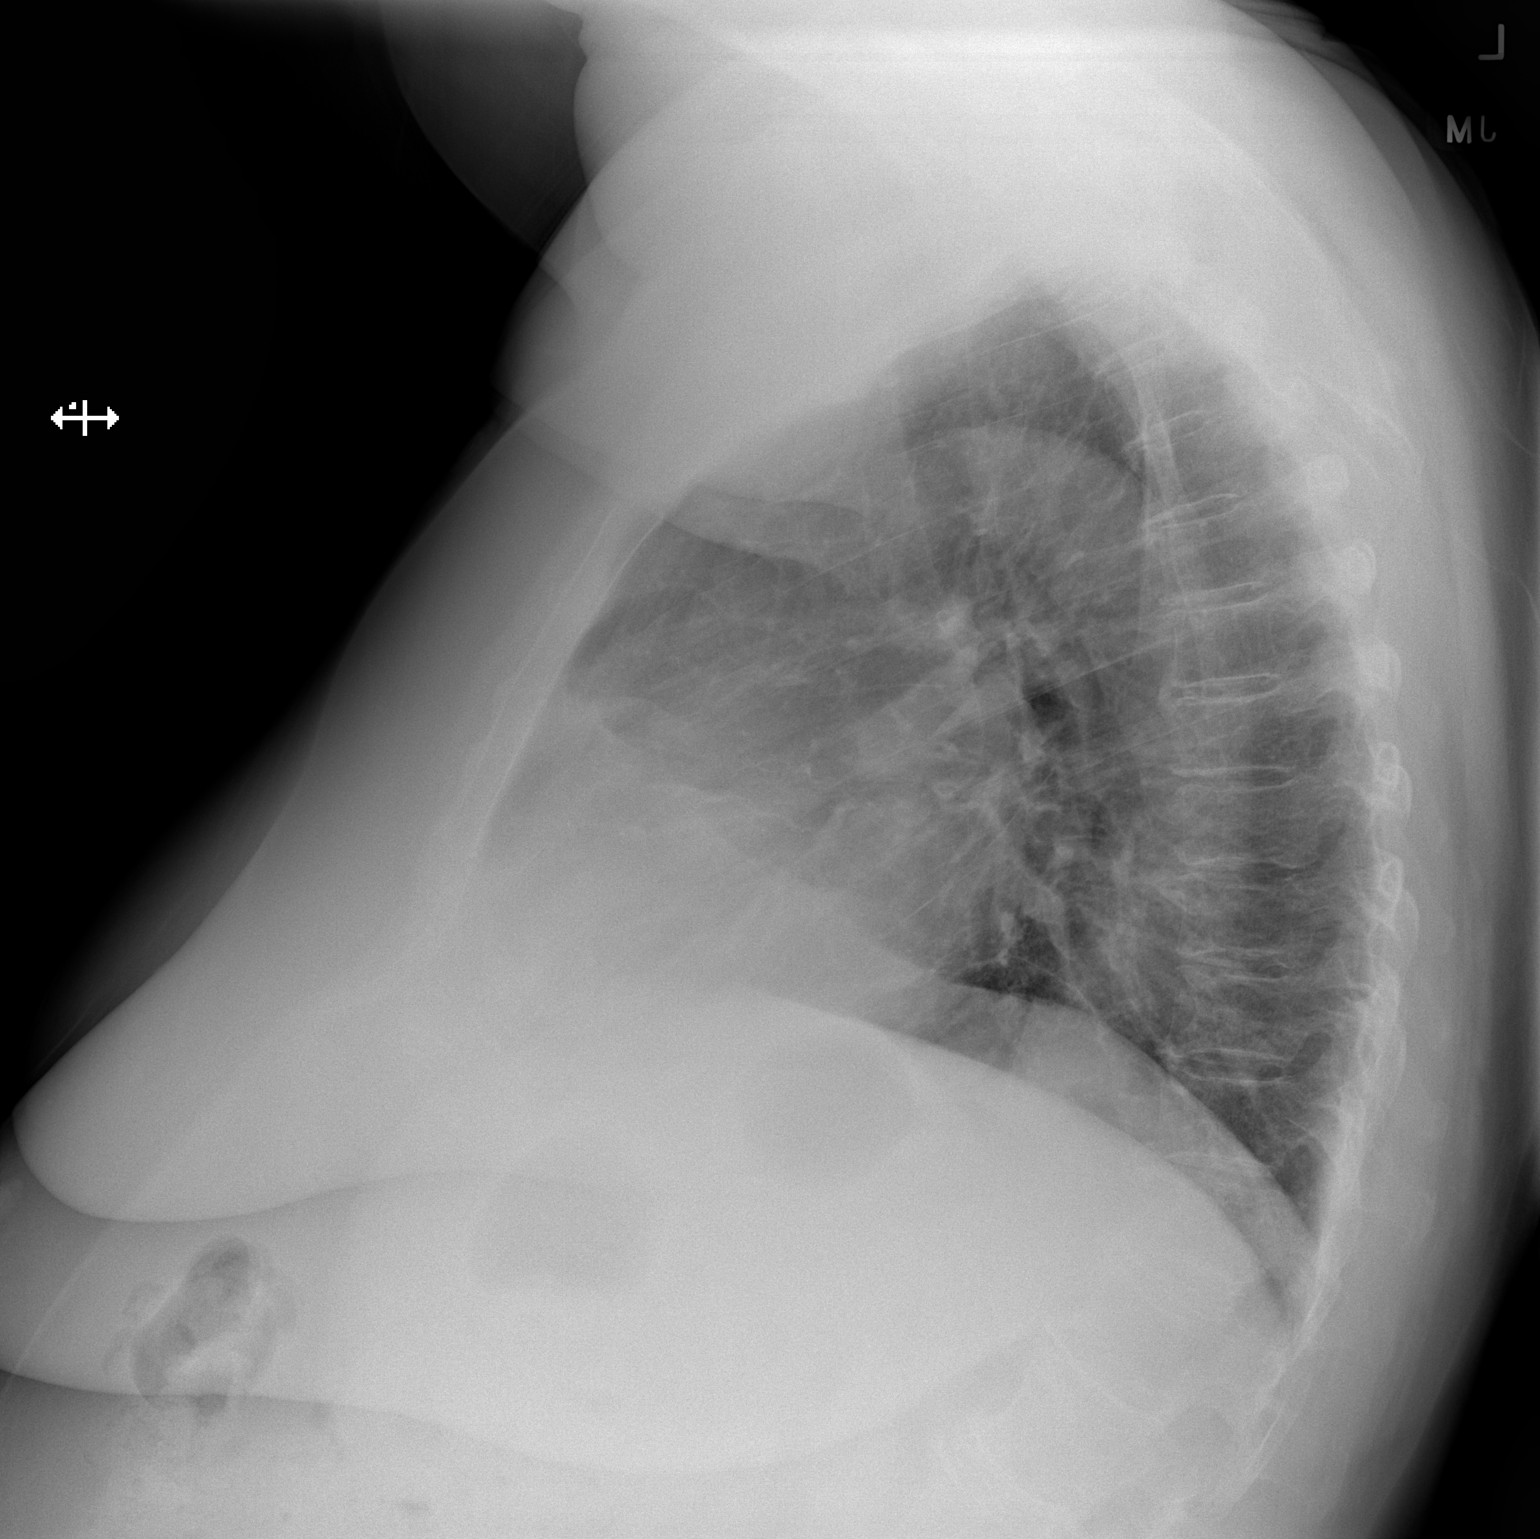

[2 of 2 positions shown; findings below may reference images not displayed]

FINDINGS: The heart size and mediastinal contours are stable. Stable mild left
basilar scarring and mild central airway thickening. No edema,
confluent airspace opacity, pleural effusion or pneumothorax. There
are mild degenerative changes in the spine without evidence of acute
osseous abnormality.
IMPRESSION: Stable chest.  No active cardiopulmonary process.

## 2022-10-19 ENCOUNTER — Telehealth: Payer: Self-pay | Admitting: Orthopedic Surgery

## 2022-10-19 DIAGNOSIS — G8929 Other chronic pain: Secondary | ICD-10-CM

## 2022-10-19 DIAGNOSIS — M1712 Unilateral primary osteoarthritis, left knee: Secondary | ICD-10-CM

## 2022-10-24 DIAGNOSIS — Z6838 Body mass index (BMI) 38.0-38.9, adult: Secondary | ICD-10-CM | POA: Diagnosis not present

## 2022-10-24 DIAGNOSIS — R030 Elevated blood-pressure reading, without diagnosis of hypertension: Secondary | ICD-10-CM | POA: Diagnosis not present

## 2022-10-24 DIAGNOSIS — H6121 Impacted cerumen, right ear: Secondary | ICD-10-CM | POA: Diagnosis not present

## 2022-10-24 DIAGNOSIS — H9190 Unspecified hearing loss, unspecified ear: Secondary | ICD-10-CM | POA: Diagnosis not present

## 2022-10-29 DIAGNOSIS — G4733 Obstructive sleep apnea (adult) (pediatric): Secondary | ICD-10-CM | POA: Diagnosis not present

## 2022-11-01 DIAGNOSIS — E039 Hypothyroidism, unspecified: Secondary | ICD-10-CM | POA: Diagnosis not present

## 2022-11-03 ENCOUNTER — Ambulatory Visit (HOSPITAL_COMMUNITY): Admission: RE | Admit: 2022-11-03 | Payer: Medicare HMO | Source: Ambulatory Visit

## 2022-11-16 ENCOUNTER — Ambulatory Visit (HOSPITAL_COMMUNITY)
Admission: RE | Admit: 2022-11-16 | Discharge: 2022-11-16 | Disposition: A | Discharge status: Home / Self Care | Payer: Medicare HMO | Source: Ambulatory Visit | Attending: Gastroenterology | Admitting: Gastroenterology

## 2022-11-16 DIAGNOSIS — K8309 Other cholangitis: Secondary | ICD-10-CM | POA: Diagnosis not present

## 2022-11-16 DIAGNOSIS — K743 Primary biliary cirrhosis: Secondary | ICD-10-CM | POA: Diagnosis not present

## 2022-11-16 DIAGNOSIS — R932 Abnormal findings on diagnostic imaging of liver and biliary tract: Secondary | ICD-10-CM | POA: Diagnosis not present

## 2022-11-16 DIAGNOSIS — K760 Fatty (change of) liver, not elsewhere classified: Secondary | ICD-10-CM | POA: Diagnosis not present

## 2022-11-16 DIAGNOSIS — Z944 Liver transplant status: Secondary | ICD-10-CM | POA: Diagnosis not present

## 2022-11-29 ENCOUNTER — Telehealth (INDEPENDENT_AMBULATORY_CARE_PROVIDER_SITE_OTHER): Payer: Self-pay

## 2022-11-29 DIAGNOSIS — G4733 Obstructive sleep apnea (adult) (pediatric): Secondary | ICD-10-CM | POA: Diagnosis not present

## 2022-11-29 NOTE — Telephone Encounter (Signed)
Agreed -

## 2022-11-29 NOTE — Telephone Encounter (Signed)
Amy patient caregiver called the office today due to patient mistakenly taking her Ursidiol all at one time instead of taking one TID, She took 1500 mg at one time. I spoke with Lewie Loron, Np and she says this should be ok,but not to take anymore Ursodiol today and go back tomorrow to the scheduled regime she should be on which is one po TID. Amy the care giver aware and states understanding

## 2022-12-06 DIAGNOSIS — K0889 Other specified disorders of teeth and supporting structures: Secondary | ICD-10-CM | POA: Diagnosis not present

## 2022-12-06 DIAGNOSIS — K047 Periapical abscess without sinus: Secondary | ICD-10-CM | POA: Diagnosis not present

## 2022-12-06 DIAGNOSIS — Z6839 Body mass index (BMI) 39.0-39.9, adult: Secondary | ICD-10-CM | POA: Diagnosis not present

## 2022-12-06 DIAGNOSIS — R030 Elevated blood-pressure reading, without diagnosis of hypertension: Secondary | ICD-10-CM | POA: Diagnosis not present

## 2022-12-12 DIAGNOSIS — H35033 Hypertensive retinopathy, bilateral: Secondary | ICD-10-CM | POA: Diagnosis not present

## 2022-12-12 DIAGNOSIS — H524 Presbyopia: Secondary | ICD-10-CM | POA: Diagnosis not present

## 2023-01-15 DIAGNOSIS — J019 Acute sinusitis, unspecified: Secondary | ICD-10-CM | POA: Diagnosis not present

## 2023-01-15 DIAGNOSIS — R030 Elevated blood-pressure reading, without diagnosis of hypertension: Secondary | ICD-10-CM | POA: Diagnosis not present

## 2023-01-15 DIAGNOSIS — R053 Chronic cough: Secondary | ICD-10-CM | POA: Diagnosis not present

## 2023-02-22 DIAGNOSIS — I700 Atherosclerosis of aorta: Secondary | ICD-10-CM | POA: Diagnosis not present

## 2023-02-22 DIAGNOSIS — E039 Hypothyroidism, unspecified: Secondary | ICD-10-CM | POA: Diagnosis not present

## 2023-02-22 DIAGNOSIS — G4733 Obstructive sleep apnea (adult) (pediatric): Secondary | ICD-10-CM | POA: Diagnosis not present

## 2023-02-22 DIAGNOSIS — E785 Hyperlipidemia, unspecified: Secondary | ICD-10-CM | POA: Diagnosis not present

## 2023-02-22 DIAGNOSIS — I10 Essential (primary) hypertension: Secondary | ICD-10-CM | POA: Diagnosis not present

## 2023-02-22 DIAGNOSIS — Z0001 Encounter for general adult medical examination with abnormal findings: Secondary | ICD-10-CM | POA: Diagnosis not present

## 2023-02-22 DIAGNOSIS — D649 Anemia, unspecified: Secondary | ICD-10-CM | POA: Diagnosis not present

## 2023-02-22 DIAGNOSIS — I5030 Unspecified diastolic (congestive) heart failure: Secondary | ICD-10-CM | POA: Diagnosis not present

## 2023-02-22 DIAGNOSIS — N1831 Chronic kidney disease, stage 3a: Secondary | ICD-10-CM | POA: Diagnosis not present

## 2023-03-19 ENCOUNTER — Ambulatory Visit: Payer: Medicare HMO | Admitting: Gastroenterology

## 2023-03-19 ENCOUNTER — Telehealth: Payer: Self-pay | Admitting: Gastroenterology

## 2023-03-19 NOTE — Progress Notes (Deleted)
 GI Office Note    Referring Provider: Lari Elspeth BRAVO, MD Primary Care Physician:  Lari Elspeth BRAVO, MD  Primary Gastroenterologist: Dr. Eartha  Chief Complaint   No chief complaint on file.   History of Present Illness   Isabella Holmes is a 78 y.o. female presenting today for six month follow up of GERD, PBC. Last seen in 09/2022. There have been concerns for early cirrhosis. She has been on urso . At last ov, she reported taking urso  bid, often skipping third dose, either inadvertently or due to expense.   Labs done 08/2022: MELD 3.0 of 7. AFP normal.   Bone dexa 09/2022 with osteopenia.   She did not schedule colonoscopy, had previously declined but then voiced desire for colonoscopy at her last ov***  US  elastography 11/2022 IMPRESSION: ULTRASOUND LIVER: Hepatic steatosis.   ULTRASOUND HEPATIC ELASTOGRAPHY: Median kPa:  2.5 Diagnostic category:  < or = 5 kPa: high probability of being normal   Medications   Current Outpatient Medications  Medication Sig Dispense Refill   meloxicam  (MOBIC ) 7.5 MG tablet TAKE 1 TABLET BY MOUTH DAILY 96 tablet 1   albuterol  (PROVENTIL  HFA;VENTOLIN  HFA) 108 (90 Base) MCG/ACT inhaler Inhale 1-2 puffs into the lungs every 4 (four) hours as needed for wheezing or shortness of breath.     albuterol  (PROVENTIL ) (2.5 MG/3ML) 0.083% nebulizer solution Take 2.5 mg by nebulization every 6 (six) hours as needed for shortness of breath.     atorvastatin  (LIPITOR) 20 MG tablet Take 20 mg by mouth daily.     famotidine (PEPCID) 20 MG tablet Take 20 mg by mouth 2 (two) times daily.     FLUoxetine  (PROZAC ) 20 MG capsule Take 20 mg by mouth daily.     furosemide  (LASIX ) 40 MG tablet Take 40 mg by mouth daily as needed for fluid.     lamoTRIgine  (LAMICTAL ) 150 MG tablet Take 150 mg by mouth at bedtime.     levothyroxine  (SYNTHROID , LEVOTHROID) 50 MCG tablet Take 50 mcg by mouth daily before breakfast.      losartan  (COZAAR ) 100 MG tablet Take 100  mg by mouth daily.     Multiple Vitamin (MULTIVITAMIN) tablet Take 1 tablet by mouth daily.     pantoprazole  (PROTONIX ) 40 MG tablet Take 40 mg by mouth daily.     ursodiol  (ACTIGALL ) 500 MG tablet Take 1 tablet (500 mg total) by mouth 3 (three) times daily. With food. 90 tablet 11   No current facility-administered medications for this visit.    Allergies   Allergies as of 03/19/2023 - Review Complete 09/11/2022  Allergen Reaction Noted   Penicillins Itching and Rash 12/17/2020   Levofloxacin Other (See Comments) 01/30/2017   Sulfa antibiotics Itching 12/17/2020   Streptomycin Rash    Sulfonamide derivatives Rash      Past Medical History   Past Medical History:  Diagnosis Date   Arthritis    Asthma    Chest pain    w abnormal ECg   Chronic headaches    migraine   COPD (chronic obstructive pulmonary disease) (HCC)    Dyslipidemia    Dyspnea    Dyspnea    GERD (gastroesophageal reflux disease)    Glucose intolerance (impaired glucose tolerance)    Hemorrhoids 03/25/2015   Hypothyroidism    Seizure disorder (HCC)    on Lamictal ; has not had seizure in 20 years   Seizures (HCC)    Sleep apnea    lost weight and does  not need CPAP any more.   Varicose veins 03/25/2015   Venous insufficiency     Past Surgical History   Past Surgical History:  Procedure Laterality Date   EXAM UNDER ANESTHESIA WITH MANIPULATION OF KNEE Right 04/05/2017   Procedure: EXAM UNDER ANESTHESIA WITH MANIPULATION OF RIGHT KNEE;  Surgeon: Margrette Taft BRAVO, MD;  Location: [facility];  Service: Orthopedics;  Laterality: Right;   KNEE ARTHROSCOPY Left    KNEE ARTHROSCOPY WITH MEDIAL MENISECTOMY Right 06/28/2016   Procedure: KNEE ARTHROSCOPY WITH MEDIAL MENISECTOMY;  Surgeon: Taft BRAVO Margrette, MD;  Location: [facility];  Service: Orthopedics;  Laterality: Right;   left vein stripping     status post bilateral tubaligation     TONSILLECTOMY     many years ago   TOTAL KNEE ARTHROPLASTY Right 02/13/2017    Procedure: TOTAL KNEE ARTHROPLASTY;  Surgeon: Margrette Taft BRAVO, MD;  Location: [facility];  Service: Orthopedics;  Laterality: Right;   TUBAL LIGATION      Past Family History   Family History  Problem Relation Age of Onset   Heart attack Mother    Tuberculosis Father    Heart disease Father        cardiac disease   Cirrhosis Father    Factor V Leiden deficiency Other    Seizures Son     Past Social History   Social History   Socioeconomic History   Marital status: Married    Spouse name: Not on file   Number of children: Not on file   Years of education: Not on file   Highest education level: Not on file  Occupational History   Not on file  Tobacco Use   Smoking status: Former    Current packs/day: 0.00    Average packs/day: 2.5 packs/day for 20.0 years (50.0 ttl pk-yrs)    Types: Cigarettes    Start date: 06/23/1966    Quit date: 06/23/1986    Years since quitting: 36.7    Passive exposure: Past   Smokeless tobacco: Never  Vaping Use   Vaping status: Never Used  Substance and Sexual Activity   Alcohol use: No   Drug use: No   Sexual activity: Yes    Birth control/protection: Post-menopausal  Other Topics Concern   Not on file  Social History Narrative   Full Time, works at [employer].    Social Drivers of Corporate Investment Banker Strain: Not on file  Food Insecurity: Not on file  Transportation Needs: Not on file  Physical Activity: Not on file  Stress: Not on file  Social Connections: Not on file  Intimate Partner Violence: Not on file    Review of Systems   General: Negative for anorexia, weight loss, fever, chills, fatigue, weakness. ENT: Negative for hoarseness, difficulty swallowing , nasal congestion. CV: Negative for chest pain, angina, palpitations, dyspnea on exertion, peripheral edema.  Respiratory: Negative for dyspnea at rest, dyspnea on exertion, cough, sputum, wheezing.  GI: See history of present illness. GU:  Negative for dysuria,  hematuria, urinary incontinence, urinary frequency, nocturnal urination.  Endo: Negative for unusual weight change.     Physical Exam   There were no vitals taken for this visit.   General: Well-nourished, well-developed in no acute distress.  Eyes: No icterus. Mouth: Oropharyngeal mucosa moist and pink , no lesions erythema or exudate. Lungs: Clear to auscultation bilaterally.  Heart: Regular rate and rhythm, no murmurs rubs or gallops.  Abdomen: Bowel sounds are normal, nontender, nondistended, no hepatosplenomegaly  or masses,  no abdominal bruits or hernia , no rebound or guarding.  Rectal: ***  Extremities: No lower extremity edema. No clubbing or deformities. Neuro: Alert and oriented x 4   Skin: Warm and dry, no jaundice.   Psych: Alert and cooperative, normal mood and affect.  Labs   *** Imaging Studies   No results found.  Assessment       PLAN   ***   Sonny RAMAN. Ezzard, MHS, PA-C [facility]

## 2023-03-19 NOTE — Telephone Encounter (Signed)
 Patient was scheduled a 6 month follow OV with [facility] today and needs to reschedule due to weather and office opening late. Per [facility] patient needs to follow up with C. Carlan or Dr Levon Hedger. Pt last saw C. Carlan.

## 2023-03-21 ENCOUNTER — Telehealth: Payer: Self-pay | Admitting: Gastroenterology

## 2023-03-21 NOTE — Telephone Encounter (Signed)
 Patient left a message that she wants to reschedule her appointment

## 2023-03-22 ENCOUNTER — Ambulatory Visit (INDEPENDENT_AMBULATORY_CARE_PROVIDER_SITE_OTHER): Payer: Medicare HMO | Admitting: Gastroenterology

## 2023-04-05 DIAGNOSIS — Z1231 Encounter for screening mammogram for malignant neoplasm of breast: Secondary | ICD-10-CM | POA: Diagnosis not present

## 2023-04-11 DIAGNOSIS — R92322 Mammographic fibroglandular density, left breast: Secondary | ICD-10-CM | POA: Diagnosis not present

## 2023-04-11 DIAGNOSIS — R928 Other abnormal and inconclusive findings on diagnostic imaging of breast: Secondary | ICD-10-CM | POA: Diagnosis not present

## 2023-04-18 ENCOUNTER — Telehealth: Payer: Self-pay | Admitting: Orthopaedic Surgery

## 2023-04-18 DIAGNOSIS — M1712 Unilateral primary osteoarthritis, left knee: Secondary | ICD-10-CM

## 2023-04-18 DIAGNOSIS — G8929 Other chronic pain: Secondary | ICD-10-CM

## 2023-04-23 ENCOUNTER — Ambulatory Visit (INDEPENDENT_AMBULATORY_CARE_PROVIDER_SITE_OTHER): Payer: Medicare HMO | Admitting: Gastroenterology

## 2023-04-23 ENCOUNTER — Encounter (INDEPENDENT_AMBULATORY_CARE_PROVIDER_SITE_OTHER): Payer: Self-pay | Admitting: Gastroenterology

## 2023-04-23 ENCOUNTER — Other Ambulatory Visit (HOSPITAL_COMMUNITY)
Admission: RE | Admit: 2023-04-23 | Discharge: 2023-04-23 | Disposition: A | Discharge status: Home / Self Care | Payer: Medicare HMO | Source: Ambulatory Visit | Attending: Gastroenterology | Admitting: Gastroenterology

## 2023-04-23 VITALS — BP 133/78 | HR 65 | Temp 97.5°F | Ht 66.0 in | Wt 241.9 lb

## 2023-04-23 DIAGNOSIS — Z1321 Encounter for screening for nutritional disorder: Secondary | ICD-10-CM | POA: Insufficient documentation

## 2023-04-23 DIAGNOSIS — K743 Primary biliary cirrhosis: Secondary | ICD-10-CM | POA: Diagnosis not present

## 2023-04-23 DIAGNOSIS — K219 Gastro-esophageal reflux disease without esophagitis: Secondary | ICD-10-CM

## 2023-04-23 DIAGNOSIS — K7400 Hepatic fibrosis, unspecified: Secondary | ICD-10-CM | POA: Diagnosis not present

## 2023-04-23 DIAGNOSIS — E785 Hyperlipidemia, unspecified: Secondary | ICD-10-CM | POA: Diagnosis not present

## 2023-04-23 LAB — COMPREHENSIVE METABOLIC PANEL
ALT: 14 U/L (ref 0–44)
AST: 15 U/L (ref 15–41)
Albumin: 3.7 g/dL (ref 3.5–5.0)
Alkaline Phosphatase: 91 U/L (ref 38–126)
Anion gap: 10 (ref 5–15)
BUN: 16 mg/dL (ref 8–23)
CO2: 25 mmol/L (ref 22–32)
Calcium: 9.2 mg/dL (ref 8.9–10.3)
Chloride: 103 mmol/L (ref 98–111)
Creatinine, Ser: 0.96 mg/dL (ref 0.44–1.00)
GFR, Estimated: >60 mL/min (ref >60)
Glucose, Bld: 88 mg/dL (ref 70–99)
Potassium: 3.8 mmol/L (ref 3.5–5.1)
Sodium: 138 mmol/L (ref 135–145)
Total Bilirubin: 0.8 mg/dL (ref 0.0–1.2)
Total Protein: 6.8 g/dL (ref 6.5–8.1)

## 2023-04-23 LAB — LIPID PANEL
Cholesterol: 114 mg/dL (ref 0–200)
HDL: 57 mg/dL (ref >40)
LDL Cholesterol: 40 mg/dL (ref 0–99)
Total CHOL/HDL Ratio: 2 {ratio}
Triglycerides: 87 mg/dL (ref <150)
VLDL: 17 mg/dL (ref 0–40)

## 2023-04-23 LAB — CBC WITH DIFFERENTIAL/PLATELET
Abs Immature Granulocytes: 0.01 10*3/uL (ref 0.00–0.07)
Basophils Absolute: 0.1 10*3/uL (ref 0.0–0.1)
Basophils Relative: 1 %
Eosinophils Absolute: 0.2 10*3/uL (ref 0.0–0.5)
Eosinophils Relative: 6 %
HCT: 37.5 % (ref 36.0–46.0)
Hemoglobin: 12.2 g/dL (ref 12.0–15.0)
Immature Granulocytes: 0 %
Lymphocytes Relative: 34 %
Lymphs Abs: 1.5 10*3/uL (ref 0.7–4.0)
MCH: 30.3 pg (ref 26.0–34.0)
MCHC: 32.5 g/dL (ref 30.0–36.0)
MCV: 93.1 fL (ref 80.0–100.0)
Monocytes Absolute: 0.5 10*3/uL (ref 0.1–1.0)
Monocytes Relative: 11 %
Neutro Abs: 2.1 10*3/uL (ref 1.7–7.7)
Neutrophils Relative %: 48 %
Platelets: 234 10*3/uL (ref 150–400)
RBC: 4.03 MIL/uL (ref 3.87–5.11)
RDW: 12.3 % (ref 11.5–15.5)
WBC: 4.4 10*3/uL (ref 4.0–10.5)
nRBC: 0 % (ref 0.0–0.2)

## 2023-04-23 LAB — VITAMIN D 25 HYDROXY (VIT D DEFICIENCY, FRACTURES): Vit D, 25-Hydroxy: 46.14 ng/mL (ref 30–100)

## 2023-04-23 MED ORDER — SUTAB 1479-225-188 MG PO TABS: ORAL_TABLET | ORAL | 0 refills | Status: DC

## 2023-04-23 NOTE — Patient Instructions (Addendum)
 Perform blood workup Schedule liver US  in March 2025 Take ursodiol  500 mg 3 times daily compliantly  (can take 2 pills in the morning and 1 at night) Follow-up with PCP regarding osteopenia If presenting worsening itching while taking URSO  compliantly, may consider other agents such as Iqirvo Schedule  colonoscopy Continue pantoprazole 40 mg every day

## 2023-04-23 NOTE — Progress Notes (Signed)
 Samantha Cress, M.D. Gastroenterology & Hepatology [facility] Gastroenterology [address] [place], [place] [address]  Primary Care Physician: Alston Jerry, MD [address] [place] [address]  I will communicate my assessment and recommendations to the referring MD via EMR.  Problems: PBC with possible CPT A cirrhosis GERD  History of Present Illness: EMPERATRIZ MEDWID is a 78 y.o. female with PMH arthritis, asthma, COPD, dyslipidemia, GERD, PBC, seizures, Sleep apnea, who presents for follow up of PBC, possible cirrhosis and GERD.  The patient was last seen on 09/11/2022. At that time, the patient was continued on ursodiol  500 mg 3 times daily.  She was continued on Protonix 40 mg daily and Pepcid 20 mg twice daily.  Patient was scheduled for colonoscopy but she canceled her colonoscopy.  Underwent elastography on 11/16/2022 which showed a low K PA of 2.5 but IQR ratio was more than 0.25.  Most recent labs from 09/04/2022 showed AFP of 5.0, CBC with WBC 4.7, hemoglobin 12.3, platelets 243, CMP with potassium 4.3, sodium 140, BUN 22, creatinine 1.08, AST 17, ALT 11, alkaline phosphatase 107, total bilirubin 0.7, albumin 4.1, INR 1.0.  Most recent DEXA scan on 09/26/2022 was -1.1 consistent with osteopenia.  Patient reports that she has prescription of URSO  500 mg TID, but states she has missed many of her lunch time doses. She has noticed that after taking the pill, she may have rumbling in her abdomen and needs to have a BM. She has frequent prurirtus. Reports feeling tired during the day although she attributes some of this to being active taking care of family.   The patient denies having any nausea, vomiting, fever, chills, hematochezia, melena, hematemesis, abdominal distention, abdominal pain, diarrhea, jaundice, dysphagia, odynophagia, pruritus or weight loss.  Last EGD: never Last Colonoscopy: 2008  Past Medical History: Past Medical History:   Diagnosis Date   Arthritis    Asthma    Chest pain    w abnormal ECg   Chronic headaches    migraine   COPD (chronic obstructive pulmonary disease) (HCC)    Dyslipidemia    Dyspnea    Dyspnea    GERD (gastroesophageal reflux disease)    Glucose intolerance (impaired glucose tolerance)    Hemorrhoids 03/25/2015   Hypothyroidism    Seizure disorder (HCC)    on Lamictal ; has not had seizure in 20 years   Seizures (HCC)    Sleep apnea    lost weight and does not need CPAP any more.   Varicose veins 03/25/2015   Venous insufficiency     Past Surgical History: Past Surgical History:  Procedure Laterality Date   EXAM UNDER ANESTHESIA WITH MANIPULATION OF KNEE Right 04/05/2017   Procedure: EXAM UNDER ANESTHESIA WITH MANIPULATION OF RIGHT KNEE;  Surgeon: Darrin Emerald, MD;  Location: [facility];  Service: Orthopedics;  Laterality: Right;   KNEE ARTHROSCOPY Left    KNEE ARTHROSCOPY WITH MEDIAL MENISECTOMY Right 06/28/2016   Procedure: KNEE ARTHROSCOPY WITH MEDIAL MENISECTOMY;  Surgeon: Darrin Emerald, MD;  Location: [facility];  Service: Orthopedics;  Laterality: Right;   left vein stripping     status post bilateral tubaligation     TONSILLECTOMY     many years ago   TOTAL KNEE ARTHROPLASTY Right 02/13/2017   Procedure: TOTAL KNEE ARTHROPLASTY;  Surgeon: Darrin Emerald, MD;  Location: [facility];  Service: Orthopedics;  Laterality: Right;   TUBAL LIGATION      Family History: Family History  Problem Relation Age of Onset   Heart attack Mother    Tuberculosis Father    Heart disease Father        cardiac disease   Cirrhosis Father    Factor V Leiden deficiency Other    Seizures Son     Social History: Social History   Tobacco Use  Smoking Status Former   Current packs/day: 0.00   Average packs/day: 2.5 packs/day for 20.0 years (50.0 ttl pk-yrs)   Types: Cigarettes   Start date: 06/23/1966   Quit date: 06/23/1986   Years since quitting: 36.8   Passive exposure:  Past  Smokeless Tobacco Never   Social History   Substance and Sexual Activity  Alcohol Use No   Social History   Substance and Sexual Activity  Drug Use No    Allergies: Allergies  Allergen Reactions   Penicillins Itching and Rash    Has patient had a PCN reaction causing immediate rash, facial/tongue/throat swelling, SOB or lightheadedness with hypotension: No Has patient had a PCN reaction causing severe rash involving mucus membranes or skin necrosis: No Has patient had a PCN reaction that required hospitalization No Has patient had a PCN reaction occurring within the last 10 years: No If all of the above answers are "NO", then may proceed with Cephalosporin use.    Levofloxacin Other (See Comments)    Recommended by Dr due to family hx   Sulfa Antibiotics Itching   Streptomycin Rash   Sulfonamide Derivatives Rash    Medications: Current Outpatient Medications  Medication Sig Dispense Refill   albuterol  (PROVENTIL  HFA;VENTOLIN  HFA) 108 (90 Base) MCG/ACT inhaler Inhale 1-2 puffs into the lungs every 4 (four) hours as needed for wheezing or shortness of breath.     albuterol  (PROVENTIL ) (2.5 MG/3ML) 0.083% nebulizer solution Take 2.5 mg by nebulization every 6 (six) hours as needed for shortness of breath.     atorvastatin (LIPITOR) 20 MG tablet Take 20 mg by mouth daily.     famotidine (PEPCID) 20 MG tablet Take 20 mg by mouth 2 (two) times daily.     FLUoxetine (PROZAC) 20 MG capsule Take 20 mg by mouth daily.     furosemide  (LASIX ) 40 MG tablet Take 40 mg by mouth daily as needed for fluid.     lamoTRIgine  (LAMICTAL ) 150 MG tablet Take 150 mg by mouth at bedtime.     levothyroxine  (SYNTHROID , LEVOTHROID) 50 MCG tablet Take 50 mcg by mouth daily before breakfast.      losartan (COZAAR) 100 MG tablet Take 100 mg by mouth daily.     meloxicam  (MOBIC ) 7.5 MG tablet TAKE 1 TABLET BY MOUTH DAILY 96 tablet 1   Multiple Vitamin (MULTIVITAMIN) tablet Take 1 tablet by mouth  daily.     pantoprazole (PROTONIX) 40 MG tablet Take 40 mg by mouth daily.     ursodiol  (ACTIGALL ) 500 MG tablet Take 1 tablet (500 mg total) by mouth 3 (three) times daily. With food. 90 tablet 11   No current facility-administered medications for this visit.    Review of Systems: GENERAL: negative for malaise, night sweats HEENT: No changes in hearing or vision, no nose bleeds or other nasal problems. NECK: Negative for lumps, goiter, pain and significant neck swelling RESPIRATORY: Negative for cough, wheezing CARDIOVASCULAR: Negative for chest pain, leg swelling, palpitations, orthopnea GI: SEE HPI MUSCULOSKELETAL: Negative for joint pain or swelling, back pain, and muscle pain. SKIN: Negative for lesions, rash PSYCH: Negative for sleep disturbance, mood disorder and recent  psychosocial stressors. HEMATOLOGY Negative for prolonged bleeding, bruising easily, and swollen nodes. ENDOCRINE: Negative for cold or heat intolerance, polyuria, polydipsia and goiter. NEURO: negative for tremor, gait imbalance, syncope and seizures. The remainder of the review of systems is noncontributory.   Physical Exam: BP 133/78 (BP Location: Left Arm, Patient Position: Sitting, Cuff Size: Large)   Pulse 65   Temp (!) 97.5 F (36.4 C) (Temporal)   Ht 5\' 6"  (1.676 m)   Wt 241 lb 14.4 oz (109.7 kg)   BMI 39.04 kg/m  GENERAL: The patient is AO x3, in no acute distress. HEENT: Head is normocephalic and atraumatic. EOMI are intact. Mouth is well hydrated and without lesions. NECK: Supple. No masses LUNGS: Clear to auscultation. No presence of rhonchi/wheezing/rales. Adequate chest expansion HEART: RRR, normal s1 and s2. ABDOMEN: Soft, nontender, no guarding, no peritoneal signs, and nondistended. BS +. No masses. EXTREMITIES: Without any cyanosis, clubbing, rash, lesions or edema. NEUROLOGIC: AOx3, no focal motor deficit. SKIN: no jaundice, no rashes  Imaging/Labs: as above  I personally reviewed  and interpreted the available labs, imaging and endoscopic files.  Impression and Plan: LORALEE CACCHIONE is a 78 y.o. female with PMH arthritis, asthma, COPD, dyslipidemia, GERD, PBC, seizures, Sleep apnea, who presents for follow up of PBC, possible cirrhosis and GERD.  Patient has had a longstanding history of PBC, with significant pruritus.  It is unknown if her pruritus is related to her disease or has worsened with the use of your ursodiol .  Unfortunately, she has not been compliant with the dosage of URSO  she was prescribed.  I advised her to try to take this medication regularly, she can try taking 2 pills in the morning and 1 at night to improve medication adherence.  We had extensive discussion regarding the pathogenesis, treatment and prognosis of PBC, as well as the potential development of liver cirrhosis if left untreated.  We also discussed the implications of cirrhosis and decompensation.  As she may have F3 fibrosis or early F4, we will need to continue doing HCC screening with AFP and ultrasound testing every 6 months.  We will obtain surveillance labs for her PBC today.  She has evidence of previous osteopenia.  She needs to follow closely with her PCP regarding bone density surveillance, especially given her history of PVC.  Will check vitamin D  levels today.  Her GERD seems to be well-controlled with use of pantoprazole daily, which she should continue for now.  Finally, she is due for colorectal cancer screening, will schedule colonoscopy.  -Check CBC, CMP, AFP, ELF test, vitamin D  and lipid panel Schedule liver US  in March 2025 Take ursodiol  500 mg 3 times daily compliantly  (can take 2 pills in the morning and 1 at night) Follow-up with PCP regarding osteopenia If presenting worsening itching while taking URSO  compliantly, may consider other agents such as Iqirvo Schedule  colonoscopy Continue pantoprazole 40 mg every day  All questions were answered.      Samantha Cress, MD Gastroenterology and Hepatology [facility] Gastroenterology

## 2023-04-24 ENCOUNTER — Encounter (INDEPENDENT_AMBULATORY_CARE_PROVIDER_SITE_OTHER): Payer: Self-pay

## 2023-04-24 LAB — MISC LABCORP TEST (SEND OUT): Labcorp test code: 550659

## 2023-04-24 LAB — AFP TUMOR MARKER: AFP, Serum, Tumor Marker: 3.9 ng/mL (ref 0.0–9.2)

## 2023-05-04 ENCOUNTER — Telehealth: Payer: Self-pay | Admitting: *Deleted

## 2023-05-04 NOTE — Telephone Encounter (Signed)
 Pt left vm wanting to have a different prep sent in because the first prep cost too much  [facility]

## 2023-05-06 NOTE — Progress Notes (Signed)
 PBC is associated with dyslipidemia, lipid panel was checked as part of surveillance panel of PBC

## 2023-05-10 NOTE — Telephone Encounter (Signed)
 Pt says she is unable to drink the prep because it makes her sick. She said the tablets that were sent in cost $140 and that's too much. Advised pt that we have a sample she can come by and pick up tomorrow. Pt will come to get sample that is placed up front.

## 2023-05-14 ENCOUNTER — Telehealth: Payer: Self-pay | Admitting: Orthopedic Surgery

## 2023-05-14 NOTE — Telephone Encounter (Signed)
 Called the dentist office and advised, they verbalized understanding.

## 2023-05-14 NOTE — Telephone Encounter (Signed)
 Dr. Mort Sawyers pt - Isabella Holmes w/[facility] [phone] lvm stating that the patient has had a joint joint replacement 63yrs ago.  She has been taking a premed antibiotic before treatment w/them but it's causing her a lot of nausea.  Due to the time since surgery they/the pt are wanting to know if the premed is still necessary

## 2023-05-15 ENCOUNTER — Telehealth (INDEPENDENT_AMBULATORY_CARE_PROVIDER_SITE_OTHER): Payer: Self-pay | Admitting: Gastroenterology

## 2023-05-15 NOTE — Telephone Encounter (Signed)
 Pt daughter in law Amy left voicemail stating she has questions about pt procedure tomorrow. Returned call to Amy but had to leave message to return call

## 2023-05-15 NOTE — Telephone Encounter (Signed)
 Pt daughter in law contacted and verbalized understanding. Will need to reschedule pt if someone cancels or when April schedule comes out. Message sent to endo to cancel.

## 2023-05-15 NOTE — Telephone Encounter (Signed)
 Pt has TCS in the morning and is to be at [facility] at 7 am. Pt daughter returned call and states that pt has 2 toaster strudel yesterday morning and at 7pm pt had a cheeseburger, french fry and a cookie. Pt has only had black coffee this morning. Pt daughter states pt got confused. Do we need to reschedule? Pt is doing pill prep. Please advise. Thank you!

## 2023-05-15 NOTE — Telephone Encounter (Signed)
 She had a lot of food and not following the recommendations, will need to reschedule thanks

## 2023-05-16 ENCOUNTER — Ambulatory Visit (HOSPITAL_COMMUNITY): Admission: RE | Admit: 2023-05-16 | Payer: Medicare HMO | Source: Home / Self Care | Admitting: Gastroenterology

## 2023-05-16 ENCOUNTER — Encounter (HOSPITAL_COMMUNITY): Admission: RE | Payer: Self-pay | Source: Home / Self Care

## 2023-05-16 SURGERY — COLONOSCOPY WITH PROPOFOL
Anesthesia: Choice

## 2023-05-18 ENCOUNTER — Ambulatory Visit (HOSPITAL_COMMUNITY)
Admission: RE | Admit: 2023-05-18 | Discharge: 2023-05-18 | Disposition: A | Discharge status: Home / Self Care | Payer: Medicare HMO | Source: Ambulatory Visit | Attending: Gastroenterology | Admitting: Gastroenterology

## 2023-05-18 DIAGNOSIS — K743 Primary biliary cirrhosis: Secondary | ICD-10-CM | POA: Diagnosis not present

## 2023-05-18 DIAGNOSIS — K7400 Hepatic fibrosis, unspecified: Secondary | ICD-10-CM | POA: Insufficient documentation

## 2023-05-18 DIAGNOSIS — K7460 Unspecified cirrhosis of liver: Secondary | ICD-10-CM | POA: Diagnosis not present

## 2023-05-18 DIAGNOSIS — K8020 Calculus of gallbladder without cholecystitis without obstruction: Secondary | ICD-10-CM | POA: Diagnosis not present

## 2023-05-21 NOTE — Telephone Encounter (Signed)
 Contacted daughter in law and rescheduled pt to 06/21/23 at 8:15am. Pt has prep already. Will mail instructions.

## 2023-05-23 ENCOUNTER — Other Ambulatory Visit: Payer: Self-pay | Admitting: Orthopedic Surgery

## 2023-05-23 MED ORDER — CLINDAMYCIN HCL 150 MG PO CAPS: 600.0000 mg | ORAL_CAPSULE | Freq: Once | ORAL | 2 refills | Status: AC

## 2023-06-04 ENCOUNTER — Encounter (INDEPENDENT_AMBULATORY_CARE_PROVIDER_SITE_OTHER): Payer: Self-pay

## 2023-06-18 ENCOUNTER — Encounter (INDEPENDENT_AMBULATORY_CARE_PROVIDER_SITE_OTHER): Payer: Self-pay

## 2023-06-18 ENCOUNTER — Encounter (HOSPITAL_COMMUNITY)
Admission: RE | Admit: 2023-06-18 | Discharge: 2023-06-18 | Disposition: A | Discharge status: Home / Self Care | Source: Ambulatory Visit | Attending: Gastroenterology | Admitting: Gastroenterology

## 2023-06-18 ENCOUNTER — Telehealth (INDEPENDENT_AMBULATORY_CARE_PROVIDER_SITE_OTHER): Payer: Self-pay | Admitting: Gastroenterology

## 2023-06-18 NOTE — Telephone Encounter (Signed)
 Pt daughter Isabella Holmes left voicemail that pt has lost instructions for TCS Thursday. Returned call to pt daughter and sent instructions via my chart.

## 2023-06-19 ENCOUNTER — Telehealth (INDEPENDENT_AMBULATORY_CARE_PROVIDER_SITE_OTHER): Payer: Self-pay | Admitting: Gastroenterology

## 2023-06-19 NOTE — Telephone Encounter (Signed)
 Pt daughter called in and states that they believe pt has COVID and is sick. Daughter will call back to reschedule.

## 2023-06-19 NOTE — Telephone Encounter (Signed)
 Thanks for the update

## 2023-06-21 ENCOUNTER — Encounter (HOSPITAL_COMMUNITY): Admission: RE | Payer: Self-pay | Source: Home / Self Care

## 2023-06-21 ENCOUNTER — Ambulatory Visit (HOSPITAL_COMMUNITY): Admission: RE | Admit: 2023-06-21 | Source: Home / Self Care | Admitting: Gastroenterology

## 2023-06-21 SURGERY — COLONOSCOPY
Anesthesia: Choice

## 2023-08-11 DIAGNOSIS — Z6838 Body mass index (BMI) 38.0-38.9, adult: Secondary | ICD-10-CM | POA: Diagnosis not present

## 2023-08-11 DIAGNOSIS — M25449 Effusion, unspecified hand: Secondary | ICD-10-CM | POA: Diagnosis not present

## 2023-08-11 DIAGNOSIS — I10 Essential (primary) hypertension: Secondary | ICD-10-CM | POA: Diagnosis not present

## 2023-08-11 DIAGNOSIS — T148XXA Other injury of unspecified body region, initial encounter: Secondary | ICD-10-CM | POA: Diagnosis not present

## 2023-08-11 DIAGNOSIS — E669 Obesity, unspecified: Secondary | ICD-10-CM | POA: Diagnosis not present

## 2023-08-17 ENCOUNTER — Encounter (INDEPENDENT_AMBULATORY_CARE_PROVIDER_SITE_OTHER): Payer: Self-pay

## 2023-08-17 ENCOUNTER — Other Ambulatory Visit (INDEPENDENT_AMBULATORY_CARE_PROVIDER_SITE_OTHER): Payer: Self-pay

## 2023-08-23 DIAGNOSIS — Z6838 Body mass index (BMI) 38.0-38.9, adult: Secondary | ICD-10-CM | POA: Diagnosis not present

## 2023-08-23 DIAGNOSIS — N1831 Chronic kidney disease, stage 3a: Secondary | ICD-10-CM | POA: Diagnosis not present

## 2023-08-23 DIAGNOSIS — E039 Hypothyroidism, unspecified: Secondary | ICD-10-CM | POA: Diagnosis not present

## 2023-08-23 DIAGNOSIS — E7849 Other hyperlipidemia: Secondary | ICD-10-CM | POA: Diagnosis not present

## 2023-08-23 DIAGNOSIS — I10 Essential (primary) hypertension: Secondary | ICD-10-CM | POA: Diagnosis not present

## 2023-09-03 DIAGNOSIS — Z6838 Body mass index (BMI) 38.0-38.9, adult: Secondary | ICD-10-CM | POA: Diagnosis not present

## 2023-09-03 DIAGNOSIS — M19041 Primary osteoarthritis, right hand: Secondary | ICD-10-CM | POA: Diagnosis not present

## 2023-09-03 DIAGNOSIS — M79644 Pain in right finger(s): Secondary | ICD-10-CM | POA: Diagnosis not present

## 2023-09-17 ENCOUNTER — Ambulatory Visit (INDEPENDENT_AMBULATORY_CARE_PROVIDER_SITE_OTHER): Payer: Medicare HMO | Admitting: Gastroenterology

## 2023-09-21 ENCOUNTER — Other Ambulatory Visit (INDEPENDENT_AMBULATORY_CARE_PROVIDER_SITE_OTHER): Payer: Self-pay

## 2023-09-21 DIAGNOSIS — K7400 Hepatic fibrosis, unspecified: Secondary | ICD-10-CM

## 2023-10-23 ENCOUNTER — Encounter (INDEPENDENT_AMBULATORY_CARE_PROVIDER_SITE_OTHER): Payer: Self-pay | Admitting: *Deleted

## 2023-10-25 ENCOUNTER — Encounter (INDEPENDENT_AMBULATORY_CARE_PROVIDER_SITE_OTHER): Payer: Self-pay | Admitting: Gastroenterology

## 2023-10-25 ENCOUNTER — Ambulatory Visit (INDEPENDENT_AMBULATORY_CARE_PROVIDER_SITE_OTHER): Admitting: Gastroenterology

## 2023-11-02 ENCOUNTER — Encounter: Payer: Self-pay | Admitting: Radiology

## 2023-11-06 ENCOUNTER — Telehealth (INDEPENDENT_AMBULATORY_CARE_PROVIDER_SITE_OTHER): Payer: Self-pay | Admitting: Gastroenterology

## 2023-11-06 ENCOUNTER — Telehealth: Payer: Self-pay | Admitting: *Deleted

## 2023-11-06 DIAGNOSIS — K743 Primary biliary cirrhosis: Secondary | ICD-10-CM

## 2023-11-06 DIAGNOSIS — K7400 Hepatic fibrosis, unspecified: Secondary | ICD-10-CM

## 2023-11-06 NOTE — Telephone Encounter (Signed)
 Received VM from pt to schedule RUQ from recall letter she received   LMOVM with US  appt details.

## 2023-11-06 NOTE — Telephone Encounter (Signed)
 I spoke with the patient and made her aware she would need her labs drawn around November 20, 2023. Please have these drawn at your local [facility]. She asked about her repeat us . I transferred her to the scheduling department to have this set up.

## 2023-11-06 NOTE — Telephone Encounter (Signed)
 Tried calling patient no answer.

## 2023-11-06 NOTE — Telephone Encounter (Signed)
 Pt called to reschedule her OV and needs it on a Monday. She is scheduled 9/22 @ 0815. She is asking about labs and her U/S being done the same day. Please advise. [phone]

## 2023-11-26 ENCOUNTER — Other Ambulatory Visit (HOSPITAL_COMMUNITY)
Admission: RE | Admit: 2023-11-26 | Discharge: 2023-11-26 | Disposition: A | Discharge status: Home / Self Care | Source: Ambulatory Visit | Attending: Gastroenterology | Admitting: Gastroenterology

## 2023-11-26 ENCOUNTER — Telehealth (INDEPENDENT_AMBULATORY_CARE_PROVIDER_SITE_OTHER): Payer: Self-pay

## 2023-11-26 ENCOUNTER — Other Ambulatory Visit (INDEPENDENT_AMBULATORY_CARE_PROVIDER_SITE_OTHER): Payer: Self-pay | Admitting: Gastroenterology

## 2023-11-26 DIAGNOSIS — K7400 Hepatic fibrosis, unspecified: Secondary | ICD-10-CM | POA: Diagnosis not present

## 2023-11-26 DIAGNOSIS — K743 Primary biliary cirrhosis: Secondary | ICD-10-CM

## 2023-11-26 MED ORDER — URSODIOL 500 MG PO TABS: 500.0000 mg | ORAL_TABLET | Freq: Three times a day (TID) | ORAL | 11 refills | Status: DC

## 2023-11-26 NOTE — Progress Notes (Signed)
 error

## 2023-11-26 NOTE — Telephone Encounter (Signed)
 Medication approved

## 2023-11-27 ENCOUNTER — Ambulatory Visit (INDEPENDENT_AMBULATORY_CARE_PROVIDER_SITE_OTHER): Payer: Self-pay

## 2023-11-27 LAB — MISC LABCORP TEST (SEND OUT): Labcorp test code: 550659

## 2023-12-03 ENCOUNTER — Encounter (INDEPENDENT_AMBULATORY_CARE_PROVIDER_SITE_OTHER): Payer: Self-pay | Admitting: Gastroenterology

## 2023-12-03 ENCOUNTER — Ambulatory Visit (INDEPENDENT_AMBULATORY_CARE_PROVIDER_SITE_OTHER): Admitting: Gastroenterology

## 2023-12-03 ENCOUNTER — Other Ambulatory Visit: Payer: Self-pay | Admitting: *Deleted

## 2023-12-03 ENCOUNTER — Ambulatory Visit (HOSPITAL_COMMUNITY)
Admission: RE | Admit: 2023-12-03 | Discharge: 2023-12-03 | Disposition: A | Discharge status: Home / Self Care | Source: Ambulatory Visit | Attending: Gastroenterology | Admitting: Gastroenterology

## 2023-12-03 VITALS — BP 120/73 | HR 83 | Temp 97.8°F | Ht 66.0 in | Wt 240.7 lb

## 2023-12-03 DIAGNOSIS — K7400 Hepatic fibrosis, unspecified: Secondary | ICD-10-CM | POA: Diagnosis not present

## 2023-12-03 DIAGNOSIS — Z1321 Encounter for screening for nutritional disorder: Secondary | ICD-10-CM

## 2023-12-03 DIAGNOSIS — K8020 Calculus of gallbladder without cholecystitis without obstruction: Secondary | ICD-10-CM | POA: Diagnosis not present

## 2023-12-03 DIAGNOSIS — K219 Gastro-esophageal reflux disease without esophagitis: Secondary | ICD-10-CM | POA: Diagnosis not present

## 2023-12-03 DIAGNOSIS — R5383 Other fatigue: Secondary | ICD-10-CM | POA: Diagnosis not present

## 2023-12-03 DIAGNOSIS — K7689 Other specified diseases of liver: Secondary | ICD-10-CM | POA: Diagnosis not present

## 2023-12-03 DIAGNOSIS — K743 Primary biliary cirrhosis: Secondary | ICD-10-CM | POA: Insufficient documentation

## 2023-12-03 DIAGNOSIS — K7460 Unspecified cirrhosis of liver: Secondary | ICD-10-CM | POA: Diagnosis not present

## 2023-12-03 NOTE — Progress Notes (Addendum)
 Referring Provider: Lari Elspeth BRAVO, MD Primary Care Physician:  Lari Elspeth BRAVO, MD Primary GI Physician: Dr. Eartha   Chief Complaint  Patient presents with   Follow-up    Pt arrives for follow up. Pt having more stomach aches more than usual. States after eating her stomach begins to gripe. Pt also states she is itching a lot, feeling of something crawling on her. Pt has US  scheduled for today.    HPI:   Isabella Holmes is a 78 y.o. female with past medical history of arthritis, asthma, COPD, dyslipidemia, GERD, PBC, seizures, Sleep apnea   Patient presenting today for:  Follow up of PBC  Possible cirrhosis GERD  Last seen February, at that time she reported taking Urso  usually only BID, having some abdominal discomfort after taking, frequent pruritus. Feels tired during the day.   -elastography on 11/16/2022 which showed a low K PA of 2.5 but IQR ratio was more than 0.25. - labs on 09/04/2022 showed AFP of 5.0, CBC with WBC 4.7, hemoglobin 12.3, platelets 243, CMP with potassium 4.3, sodium 140, BUN 22, creatinine 1.08, AST 17, ALT 11, alkaline phosphatase 107, total bilirubin 0.7, albumin 4.1, INR 1.0. -DEXA scan on 09/26/2022 was -1.1 consistent with osteopenia.  Recommended to check CBC, CMP, AFP, ELF, vitamin d   and lipid panel, US  march 2025, take urso  500mg  TID, follow up with PCP regarding osteopenia, consider Iquirvo if pruritus continues, schedule colonoscopy, continue protonix 40mg  daily  Labs done 04/23/23 with normal CBC, AST 15 ALT 14 Alk phos 91, T bili 0.8 , vit d 46.14 AFP 3.9 Lipid panel WNL (last TSH 2.96 8/24)  RUQ US  march 2025 with cirrhotic morphology, cholelithiasis without acute cholecystitis   ELF done 11/26/23 10.22   Colonoscopy was canceled x2   Patient has US  RUQ scheduled for today  Present: She continues to have some griping sometimes after eating, she relates symptoms to similar feeling of needing to have a BM, which she reports will  improve her symptoms. No nausea or vomiting. Denies abdominal pain. No changes in appetite. She states pain does not happen everytime she eats. She reports heartburn on two occasions over the past few weeks. Taking mylanta which has helped. She is still taking protonix daily and pepcid 20mg  BID She denies any dietary changes. She reports some continued itching, worse on her head, itching seems to come and go. She is taking urso  TID compliantly. Denies any rectal bleeding or melena. Denies episode of confusion or jaundice.    Last Colonoscopy:2008 Last Endoscopy: never   [facility] Weights   12/03/23 0811  Weight: 240 lb 11.2 oz (109.2 kg)     Past Medical History:  Diagnosis Date   Arthritis    Asthma    Chest pain    w abnormal ECg   Chronic headaches    migraine   COPD (chronic obstructive pulmonary disease) (HCC)    Dyslipidemia    Dyspnea    Dyspnea    GERD (gastroesophageal reflux disease)    Glucose intolerance (impaired glucose tolerance)    Hemorrhoids 03/25/2015   Hypothyroidism    Seizure disorder (HCC)    on Lamictal ; has not had seizure in 20 years   Seizures (HCC)    Sleep apnea    lost weight and does not need CPAP any more.   Varicose veins 03/25/2015   Venous insufficiency     Past Surgical History:  Procedure Laterality Date   EXAM UNDER ANESTHESIA WITH MANIPULATION OF  KNEE Right 04/05/2017   Procedure: EXAM UNDER ANESTHESIA WITH MANIPULATION OF RIGHT KNEE;  Surgeon: Margrette Taft BRAVO, MD;  Location: [facility];  Service: Orthopedics;  Laterality: Right;   KNEE ARTHROSCOPY Left    KNEE ARTHROSCOPY WITH MEDIAL MENISECTOMY Right 06/28/2016   Procedure: KNEE ARTHROSCOPY WITH MEDIAL MENISECTOMY;  Surgeon: Taft BRAVO Margrette, MD;  Location: [facility];  Service: Orthopedics;  Laterality: Right;   left vein stripping     status post bilateral tubaligation     TONSILLECTOMY     many years ago   TOTAL KNEE ARTHROPLASTY Right 02/13/2017   Procedure: TOTAL KNEE ARTHROPLASTY;   Surgeon: Margrette Taft BRAVO, MD;  Location: [facility];  Service: Orthopedics;  Laterality: Right;   TUBAL LIGATION      Current Outpatient Medications  Medication Sig Dispense Refill   albuterol  (PROVENTIL  HFA;VENTOLIN  HFA) 108 (90 Base) MCG/ACT inhaler Inhale 1-2 puffs into the lungs every 4 (four) hours as needed for wheezing or shortness of breath.     albuterol  (PROVENTIL ) (2.5 MG/3ML) 0.083% nebulizer solution Take 2.5 mg by nebulization every 6 (six) hours as needed for shortness of breath.     atorvastatin (LIPITOR) 20 MG tablet Take 20 mg by mouth daily.     famotidine (PEPCID) 20 MG tablet Take 20 mg by mouth 2 (two) times daily.     FLUoxetine (PROZAC) 20 MG capsule Take 20 mg by mouth daily.     furosemide  (LASIX ) 40 MG tablet Take 40 mg by mouth daily as needed for fluid.     lamoTRIgine  (LAMICTAL ) 150 MG tablet Take 150 mg by mouth at bedtime.     levothyroxine  (SYNTHROID , LEVOTHROID) 50 MCG tablet Take 50 mcg by mouth daily before breakfast.      losartan (COZAAR) 100 MG tablet Take 100 mg by mouth daily.     meloxicam  (MOBIC ) 7.5 MG tablet TAKE 1 TABLET BY MOUTH DAILY 96 tablet 1   Multiple Vitamin (MULTIVITAMIN) tablet Take 1 tablet by mouth daily.     pantoprazole (PROTONIX) 40 MG tablet Take 40 mg by mouth daily.     ursodiol  (ACTIGALL ) 500 MG tablet Take 1 tablet (500 mg total) by mouth 3 (three) times daily. With food. 90 tablet 11   No current facility-administered medications for this visit.    Allergies as of 12/03/2023 - Review Complete 12/03/2023  Allergen Reaction Noted   Penicillins Itching and Rash 12/17/2020   Levofloxacin Other (See Comments) 01/30/2017   Sulfa antibiotics Itching 12/17/2020   Streptomycin Rash    Sulfonamide derivatives Rash     Social History   Socioeconomic History   Marital status: Married    Spouse name: Not on file   Number of children: Not on file   Years of education: Not on file   Highest education level: Not on file   Occupational History   Not on file  Tobacco Use   Smoking status: Former    Current packs/day: 0.00    Average packs/day: 2.5 packs/day for 20.0 years (50.0 ttl pk-yrs)    Types: Cigarettes    Start date: 06/23/1966    Quit date: 06/23/1986    Years since quitting: 37.4    Passive exposure: Past   Smokeless tobacco: Never  Vaping Use   Vaping status: Never Used  Substance and Sexual Activity   Alcohol use: No   Drug use: No   Sexual activity: Yes    Birth control/protection: Post-menopausal  Other Topics Concern   Not on file  Social History Narrative   Full Time, works at [employer].    Social Drivers of Corporate investment banker Strain: Not on file  Food Insecurity: Not on file  Transportation Needs: Not on file  Physical Activity: Not on file  Stress: Not on file  Social Connections: Not on file    Review of systems General: negative for malaise, night sweats, fever, chills, weight loss Neck: Negative for lumps, goiter, pain and significant neck swelling Resp: Negative for cough, wheezing, dyspnea at rest CV: Negative for chest pain, leg swelling, palpitations, orthopnea GI: denies melena, hematochezia, nausea, vomiting, diarrhea, constipation, dysphagia, odyonophagia, early satiety or unintentional weight loss. +abdominal rumbling  +pruritus +heartburn MSK: Negative for joint pain or swelling, back pain, and muscle pain. Derm: Negative for itching or rash Psych: Denies depression, anxiety, memory loss, confusion. No homicidal or suicidal ideation.  Heme: Negative for prolonged bleeding, bruising easily, and swollen nodes. Endocrine: Negative for cold or heat intolerance, polyuria, polydipsia and goiter. Neuro: negative for tremor, gait imbalance, syncope and seizures. The remainder of the review of systems is noncontributory.  Physical Exam: BP 120/73   Pulse 83   Temp 97.8 F (36.6 C)   Ht 5' 6 (1.676 m)   Wt 240 lb 11.2 oz (109.2 kg)   BMI 38.85 kg/m   General:   Alert and oriented. No distress noted. Pleasant and cooperative.  Head:  Normocephalic and atraumatic. Eyes:  Conjuctiva clear without scleral icterus. Mouth:  Oral mucosa pink and moist. Good dentition. No lesions. Heart: Normal rate and rhythm, s1 and s2 heart sounds present.  Lungs: Clear lung sounds in all lobes. Respirations equal and unlabored. Abdomen:  +BS, soft, non-tender and non-distended. No rebound or guarding. No HSM or masses noted. Derm: No palmar erythema or jaundice Msk:  Symmetrical without gross deformities. Normal posture. Extremities:  Without edema. Neurologic:  Alert and  oriented x4 Psych:  Alert and cooperative. Normal mood and affect.  Invalid input(s): 6 MONTHS   ASSESSMENT: LOGHAN SUBIA is a 78 y.o. female presenting today for follow up of PBC, GERD and Possible cirrhosis  PBC: longstanding history of PBC, with pruritus. She is compliantly taking her Urso  TID, last Alk pHos and bili in February were at goal. Will update CMP along with other labs today. If significant pruritus continues, may need to consider addition of other therapy such as Iquirvo. She is up to date on DEXA and last lipid panel earlier this year was WNL, will update Vitamin A  and TSH along with labs today as she is at risk for vitamin deficiency and thyroid  function abnormalities given her PBC.   GERD: on protonix 40mg  daily, pepcid 20mg  BID, some heartburn over the past few weeks. For now will continue with current regimen, if she notes more GERD symptoms over the next few weeks she should make me aware and we will consider changing PPI to omeprazole 40mg  daily.    Possible cirrhosis: US  in march with cirrhotic morphology, ELF testing done last week with score of 10.22, it is unclear if this is advanced fibrosis or early cirrhosis. I discussed this with the patient and given underlying PBC I am recommending liver biopsy for more definitive diagnosis to rule out cirrhosis. Will  check INR and AFP with labs, as above. Indications, risks and benefits of procedure discussed in detail with patient. Patient verbalized understanding and is in agreement to proceed with liver biopsy.    In regards to [facility] screening, colonoscopy scheduled and  canceled x2 earlier this year. We discussed importance of updating this. At this time she wants to hold off but will let me know when she wishes to reschedule this   PLAN:  -proceed with US  today -CBC, CMP, INR, AFP, Vit D level, Vitamin A , TSH -continue protonix 40mg  daily -continue pepcid 20mg  BID -consider change in PPI therapy if heartburn persists -good reflux precautions  -continue Urso  500mg  TID, consider addition of Iquirvo if pruritus continues  -schedule percutaneous liver biopsy -reschedule Colonoscopy, pt to make me aware when she is ready to do this  -will repeat lipid panel at follow up   All questions were answered, patient verbalized understanding and is in agreement with plan as outlined above.   Follow Up: 6 months   Isabella Handa L. Mariette, MSN, APRN, AGNP-C Adult-Gerontology Nurse Practitioner [facility]  I have reviewed the note and agree with the APP's assessment as described in this progress note  Toribio Fortune, MD Gastroenterology and Hepatology [facility]

## 2023-12-03 NOTE — Patient Instructions (Signed)
-  proceed with US  today -we will update labs  -continue protonix 40mg  daily -continue pepcid 20mg  twice daily -let me know if heartburn is worsening over the next few weeks  -continue Urso  500mg  three times per day -schedule liver biopsy -please make me aware when you ar ready to reschedule Colonoscopy   Follow up 6 months  It was a pleasure to see you today. I want to create trusting relationships with patients and provide genuine, compassionate, and quality care. I truly value your feedback! please be on the lookout for a survey regarding your visit with me today. I appreciate your input about our visit and your time in completing this!    Damen Windsor L. Sherrilynn Gudgel, MSN, APRN, AGNP-C Adult-Gerontology Nurse Practitioner [facility] at [facility]

## 2023-12-05 ENCOUNTER — Ambulatory Visit (INDEPENDENT_AMBULATORY_CARE_PROVIDER_SITE_OTHER): Payer: Self-pay | Admitting: Gastroenterology

## 2023-12-05 NOTE — Telephone Encounter (Signed)
 Mth US  noted in recall

## 2023-12-06 ENCOUNTER — Telehealth (INDEPENDENT_AMBULATORY_CARE_PROVIDER_SITE_OTHER): Payer: Self-pay

## 2023-12-06 NOTE — Telephone Encounter (Signed)
 Patient daughter Madelin ([place]) called regarding recent lab work and US .   They wanted to know if the biopsy would show definitely if patient has cirrhosis. I told her yes.   She asked if her liver numbers could be elevated due to having gall stones, I advised it could cause an issue if a stone got stuck in the bile duct,but the us  did not show any evidence of this.   She also inquired about the elevated TSH and whom needed to address this. I advised her to check with the PCP or her endocrinologist regarding this.   Please advise, if I need to call back with any other answers to these questions.   Thanks,

## 2023-12-06 NOTE — Telephone Encounter (Signed)
 Thanks, agree with this.  Liver enzymes do not show any abnormalities suggestive of a stone obstructing her bile ducts and her most recent ultrasound did not show any signs concerning for this as well.

## 2023-12-07 LAB — COMPREHENSIVE METABOLIC PANEL WITH GFR
AG Ratio: 2 (calc) (ref 1.0–2.5)
ALT: 8 U/L (ref 6–29)
AST: 13 U/L (ref 10–35)
Albumin: 4.1 g/dL (ref 3.6–5.1)
Alkaline phosphatase (APISO): 91 U/L (ref 37–153)
BUN: 14 mg/dL (ref 7–25)
CO2: 30 mmol/L (ref 20–32)
Calcium: 9.2 mg/dL (ref 8.6–10.4)
Chloride: 103 mmol/L (ref 98–110)
Creat: 0.89 mg/dL (ref 0.60–1.00)
Globulin: 2.1 g/dL (ref 1.9–3.7)
Glucose, Bld: 90 mg/dL (ref 65–99)
Potassium: 4 mmol/L (ref 3.5–5.3)
Sodium: 142 mmol/L (ref 135–146)
Total Bilirubin: 0.7 mg/dL (ref 0.2–1.2)
Total Protein: 6.2 g/dL (ref 6.1–8.1)
eGFR: 67 mL/min/1.73m2 (ref >=60)

## 2023-12-07 LAB — VITAMIN D 25 HYDROXY (VIT D DEFICIENCY, FRACTURES): Vit D, 25-Hydroxy: 36 ng/mL (ref 30–100)

## 2023-12-07 LAB — CBC
HCT: 38.8 % (ref 35.0–45.0)
Hemoglobin: 12.3 g/dL (ref 11.7–15.5)
MCH: 29.4 pg (ref 27.0–33.0)
MCHC: 31.7 g/dL — ABNORMAL LOW (ref 32.0–36.0)
MCV: 92.6 fL (ref 80.0–100.0)
MPV: 10.4 fL (ref 7.5–12.5)
Platelets: 229 Thousand/uL (ref 140–400)
RBC: 4.19 Million/uL (ref 3.80–5.10)
RDW: 12.5 % (ref 11.0–15.0)
WBC: 4.6 Thousand/uL (ref 3.8–10.8)

## 2023-12-07 LAB — PROTIME-INR
INR: 1
Prothrombin Time: 10.5 s (ref 9.0–11.5)

## 2023-12-07 LAB — TSH: TSH: 5.42 m[IU]/L — ABNORMAL HIGH (ref 0.40–4.50)

## 2023-12-07 LAB — VITAMIN A: Vitamin A (Retinoic Acid): 38 ug/dL (ref 38–98)

## 2023-12-07 LAB — AFP TUMOR MARKER: AFP-Tumor Marker: 6.1 ng/mL — ABNORMAL HIGH

## 2023-12-10 ENCOUNTER — Ambulatory Visit (INDEPENDENT_AMBULATORY_CARE_PROVIDER_SITE_OTHER): Payer: Self-pay | Admitting: Gastroenterology

## 2023-12-14 DIAGNOSIS — Z20828 Contact with and (suspected) exposure to other viral communicable diseases: Secondary | ICD-10-CM | POA: Diagnosis not present

## 2023-12-14 DIAGNOSIS — J209 Acute bronchitis, unspecified: Secondary | ICD-10-CM | POA: Diagnosis not present

## 2023-12-14 DIAGNOSIS — Z6838 Body mass index (BMI) 38.0-38.9, adult: Secondary | ICD-10-CM | POA: Diagnosis not present

## 2023-12-14 DIAGNOSIS — J4541 Moderate persistent asthma with (acute) exacerbation: Secondary | ICD-10-CM | POA: Diagnosis not present

## 2023-12-14 DIAGNOSIS — J4531 Mild persistent asthma with (acute) exacerbation: Secondary | ICD-10-CM | POA: Diagnosis not present

## 2023-12-18 ENCOUNTER — Encounter (INDEPENDENT_AMBULATORY_CARE_PROVIDER_SITE_OTHER): Payer: Self-pay

## 2023-12-18 DIAGNOSIS — R5383 Other fatigue: Secondary | ICD-10-CM | POA: Insufficient documentation

## 2023-12-26 ENCOUNTER — Encounter (INDEPENDENT_AMBULATORY_CARE_PROVIDER_SITE_OTHER): Payer: Self-pay | Admitting: Gastroenterology

## 2024-01-02 ENCOUNTER — Ambulatory Visit (HOSPITAL_COMMUNITY)

## 2024-01-14 ENCOUNTER — Encounter: Payer: Self-pay | Admitting: Radiology

## 2024-01-17 ENCOUNTER — Other Ambulatory Visit (INDEPENDENT_AMBULATORY_CARE_PROVIDER_SITE_OTHER): Payer: Self-pay | Admitting: Gastroenterology

## 2024-01-17 DIAGNOSIS — K743 Primary biliary cirrhosis: Secondary | ICD-10-CM

## 2024-01-22 ENCOUNTER — Other Ambulatory Visit: Payer: Self-pay

## 2024-01-23 ENCOUNTER — Emergency Department (HOSPITAL_COMMUNITY)

## 2024-01-23 ENCOUNTER — Other Ambulatory Visit: Payer: Self-pay

## 2024-01-23 ENCOUNTER — Observation Stay (HOSPITAL_COMMUNITY)
Admission: EM | Admit: 2024-01-23 | Discharge: 2024-01-24 | Disposition: A | Discharge status: Home / Self Care | Source: Ambulatory Visit | Attending: Emergency Medicine | Admitting: Emergency Medicine

## 2024-01-23 ENCOUNTER — Ambulatory Visit (HOSPITAL_COMMUNITY)
Admission: RE | Admit: 2024-01-23 | Discharge: 2024-01-23 | Disposition: A | Discharge status: Home / Self Care | Source: Ambulatory Visit | Attending: Gastroenterology | Admitting: Gastroenterology

## 2024-01-23 ENCOUNTER — Encounter (HOSPITAL_COMMUNITY): Payer: Self-pay | Admitting: Internal Medicine

## 2024-01-23 ENCOUNTER — Encounter (HOSPITAL_COMMUNITY): Payer: Self-pay

## 2024-01-23 VITALS — BP 169/98 | HR 107 | Temp 98.5°F | Resp 14

## 2024-01-23 DIAGNOSIS — Z87891 Personal history of nicotine dependence: Secondary | ICD-10-CM | POA: Insufficient documentation

## 2024-01-23 DIAGNOSIS — K219 Gastro-esophageal reflux disease without esophagitis: Secondary | ICD-10-CM | POA: Insufficient documentation

## 2024-01-23 DIAGNOSIS — I4891 Unspecified atrial fibrillation: Secondary | ICD-10-CM | POA: Diagnosis not present

## 2024-01-23 DIAGNOSIS — Z9189 Other specified personal risk factors, not elsewhere classified: Secondary | ICD-10-CM | POA: Diagnosis not present

## 2024-01-23 DIAGNOSIS — J4489 Other specified chronic obstructive pulmonary disease: Secondary | ICD-10-CM | POA: Insufficient documentation

## 2024-01-23 DIAGNOSIS — E785 Hyperlipidemia, unspecified: Secondary | ICD-10-CM | POA: Insufficient documentation

## 2024-01-23 DIAGNOSIS — N1831 Chronic kidney disease, stage 3a: Secondary | ICD-10-CM | POA: Diagnosis not present

## 2024-01-23 DIAGNOSIS — Z79899 Other long term (current) drug therapy: Secondary | ICD-10-CM | POA: Insufficient documentation

## 2024-01-23 DIAGNOSIS — R0989 Other specified symptoms and signs involving the circulatory and respiratory systems: Secondary | ICD-10-CM | POA: Diagnosis not present

## 2024-01-23 DIAGNOSIS — I10 Essential (primary) hypertension: Secondary | ICD-10-CM | POA: Diagnosis not present

## 2024-01-23 DIAGNOSIS — E66812 Obesity, class 2: Secondary | ICD-10-CM | POA: Insufficient documentation

## 2024-01-23 DIAGNOSIS — K7460 Unspecified cirrhosis of liver: Secondary | ICD-10-CM | POA: Diagnosis not present

## 2024-01-23 DIAGNOSIS — K743 Primary biliary cirrhosis: Secondary | ICD-10-CM | POA: Diagnosis not present

## 2024-01-23 DIAGNOSIS — F32A Depression, unspecified: Secondary | ICD-10-CM | POA: Insufficient documentation

## 2024-01-23 DIAGNOSIS — K91871 Postprocedural hematoma of a digestive system organ or structure following other procedure: Secondary | ICD-10-CM | POA: Diagnosis not present

## 2024-01-23 DIAGNOSIS — I129 Hypertensive chronic kidney disease with stage 1 through stage 4 chronic kidney disease, or unspecified chronic kidney disease: Secondary | ICD-10-CM | POA: Insufficient documentation

## 2024-01-23 DIAGNOSIS — E039 Hypothyroidism, unspecified: Secondary | ICD-10-CM | POA: Diagnosis not present

## 2024-01-23 DIAGNOSIS — K661 Hemoperitoneum: Principal | ICD-10-CM | POA: Insufficient documentation

## 2024-01-23 DIAGNOSIS — I700 Atherosclerosis of aorta: Secondary | ICD-10-CM | POA: Diagnosis not present

## 2024-01-23 DIAGNOSIS — K7400 Hepatic fibrosis, unspecified: Secondary | ICD-10-CM

## 2024-01-23 DIAGNOSIS — R1011 Right upper quadrant pain: Secondary | ICD-10-CM | POA: Diagnosis present

## 2024-01-23 DIAGNOSIS — R109 Unspecified abdominal pain: Secondary | ICD-10-CM | POA: Diagnosis not present

## 2024-01-23 DIAGNOSIS — R1013 Epigastric pain: Secondary | ICD-10-CM | POA: Diagnosis not present

## 2024-01-23 DIAGNOSIS — K8020 Calculus of gallbladder without cholecystitis without obstruction: Secondary | ICD-10-CM | POA: Diagnosis not present

## 2024-01-23 LAB — COMPREHENSIVE METABOLIC PANEL WITH GFR
ALT: 14 U/L (ref 0–44)
AST: 25 U/L (ref 15–41)
Albumin: 4.3 g/dL (ref 3.5–5.0)
Alkaline Phosphatase: 105 U/L (ref 38–126)
Anion gap: 9 (ref 5–15)
BUN: 14 mg/dL (ref 8–23)
CO2: 27 mmol/L (ref 22–32)
Calcium: 9.9 mg/dL (ref 8.9–10.3)
Chloride: 104 mmol/L (ref 98–111)
Creatinine, Ser: 1.08 mg/dL — ABNORMAL HIGH (ref 0.44–1.00)
GFR, Estimated: 53 mL/min — ABNORMAL LOW (ref >60)
Glucose, Bld: 97 mg/dL (ref 70–99)
Potassium: 4.7 mmol/L (ref 3.5–5.1)
Sodium: 140 mmol/L (ref 135–145)
Total Bilirubin: 0.9 mg/dL (ref 0.0–1.2)
Total Protein: 6.9 g/dL (ref 6.5–8.1)

## 2024-01-23 LAB — CBC WITH DIFFERENTIAL/PLATELET
Abs Immature Granulocytes: 0.01 K/uL (ref 0.00–0.07)
Basophils Absolute: 0 K/uL (ref 0.0–0.1)
Basophils Relative: 1 %
Eosinophils Absolute: 0.1 K/uL (ref 0.0–0.5)
Eosinophils Relative: 2 %
HCT: 43.3 % (ref 36.0–46.0)
Hemoglobin: 13.6 g/dL (ref 12.0–15.0)
Immature Granulocytes: 0 %
Lymphocytes Relative: 41 %
Lymphs Abs: 1.8 K/uL (ref 0.7–4.0)
MCH: 29.2 pg (ref 26.0–34.0)
MCHC: 31.4 g/dL (ref 30.0–36.0)
MCV: 92.9 fL (ref 80.0–100.0)
Monocytes Absolute: 0.4 K/uL (ref 0.1–1.0)
Monocytes Relative: 9 %
Neutro Abs: 2.1 K/uL (ref 1.7–7.7)
Neutrophils Relative %: 47 %
Platelets: 194 K/uL (ref 150–400)
RBC: 4.66 MIL/uL (ref 3.87–5.11)
RDW: 13.2 % (ref 11.5–15.5)
WBC: 4.4 K/uL (ref 4.0–10.5)
nRBC: 0 % (ref 0.0–0.2)

## 2024-01-23 LAB — CBC
HCT: 40.4 % (ref 36.0–46.0)
Hemoglobin: 13.2 g/dL (ref 12.0–15.0)
MCH: 30.1 pg (ref 26.0–34.0)
MCHC: 32.7 g/dL (ref 30.0–36.0)
MCV: 92 fL (ref 80.0–100.0)
Platelets: 206 K/uL (ref 150–400)
RBC: 4.39 MIL/uL (ref 3.87–5.11)
RDW: 13.1 % (ref 11.5–15.5)
WBC: 4.4 K/uL (ref 4.0–10.5)
nRBC: 0 % (ref 0.0–0.2)

## 2024-01-23 LAB — LIPASE, BLOOD: Lipase: 43 U/L (ref 11–51)

## 2024-01-23 LAB — I-STAT CHEM 8, ED
BUN: 17 mg/dL (ref 8–23)
Calcium, Ion: 0.99 mmol/L — ABNORMAL LOW (ref 1.15–1.40)
Chloride: 107 mmol/L (ref 98–111)
Creatinine, Ser: 1.1 mg/dL — ABNORMAL HIGH (ref 0.44–1.00)
Glucose, Bld: 100 mg/dL — ABNORMAL HIGH (ref 70–99)
HCT: 42 % (ref 36.0–46.0)
Hemoglobin: 14.3 g/dL (ref 12.0–15.0)
Potassium: 4.2 mmol/L (ref 3.5–5.1)
Sodium: 138 mmol/L (ref 135–145)
TCO2: 25 mmol/L (ref 22–32)

## 2024-01-23 LAB — PROTIME-INR
INR: 0.9 (ref 0.8–1.2)
Prothrombin Time: 12.8 s (ref 11.4–15.2)

## 2024-01-23 LAB — TSH: TSH: 2.77 u[IU]/mL (ref 0.350–4.500)

## 2024-01-23 MED ORDER — ACETAMINOPHEN 325 MG PO TABS: 650.0000 mg | ORAL_TABLET | Freq: Four times a day (QID) | ORAL | Status: DC | PRN

## 2024-01-23 MED ORDER — POLYETHYLENE GLYCOL 3350 17 G PO PACK: 17.0000 g | PACK | Freq: Every day | ORAL | Status: DC | PRN

## 2024-01-23 MED ORDER — ORAL CARE MOUTH RINSE: 15.0000 mL | OROMUCOSAL | Status: DC | PRN

## 2024-01-23 MED ORDER — GELATIN ABSORBABLE 12-7 MM EX MISC
CUTANEOUS | Status: AC
Start: 2024-01-23 — End: 2024-01-23
  Filled 2024-01-23: qty 1

## 2024-01-23 MED ORDER — ALBUTEROL SULFATE (2.5 MG/3ML) 0.083% IN NEBU
2.5000 mg | INHALATION_SOLUTION | RESPIRATORY_TRACT | Status: DC | PRN
Start: 2024-01-23 — End: 2024-01-24

## 2024-01-23 MED ORDER — FLUOXETINE HCL 20 MG PO CAPS
20.0000 mg | ORAL_CAPSULE | Freq: Every day | ORAL | Status: DC
  Administered 2024-01-24: 20 mg via ORAL
  Filled 2024-01-23: qty 1

## 2024-01-23 MED ORDER — ATORVASTATIN CALCIUM 10 MG PO TABS: 20.0000 mg | ORAL_TABLET | Freq: Every day | ORAL | Status: DC

## 2024-01-23 MED ORDER — DOCUSATE SODIUM 100 MG PO CAPS
100.0000 mg | ORAL_CAPSULE | Freq: Two times a day (BID) | ORAL | Status: DC
  Administered 2024-01-23 – 2024-01-24 (×2): 100 mg via ORAL
  Filled 2024-01-23 (×2): qty 1

## 2024-01-23 MED ORDER — ACETAMINOPHEN 500 MG PO TABS
500.0000 mg | ORAL_TABLET | Freq: Four times a day (QID) | ORAL | Status: DC | PRN
  Administered 2024-01-23: 500 mg via ORAL
  Filled 2024-01-23: qty 1

## 2024-01-23 MED ORDER — OXYCODONE HCL 5 MG PO TABS
5.0000 mg | ORAL_TABLET | ORAL | Status: DC | PRN
  Administered 2024-01-23 – 2024-01-24 (×2): 5 mg via ORAL
  Filled 2024-01-23 (×2): qty 1

## 2024-01-23 MED ORDER — URSODIOL 500 MG PO TABS
500.0000 mg | ORAL_TABLET | Freq: Three times a day (TID) | ORAL | Status: DC
  Filled 2024-01-23: qty 1

## 2024-01-23 MED ORDER — LEVOTHYROXINE SODIUM 50 MCG PO TABS
50.0000 ug | ORAL_TABLET | Freq: Every day | ORAL | Status: DC
  Administered 2024-01-24: 50 ug via ORAL
  Filled 2024-01-23: qty 1

## 2024-01-23 MED ORDER — LOSARTAN POTASSIUM 50 MG PO TABS
100.0000 mg | ORAL_TABLET | Freq: Every day | ORAL | Status: DC
  Administered 2024-01-24: 100 mg via ORAL
  Filled 2024-01-23: qty 2

## 2024-01-23 MED ORDER — HYDROMORPHONE HCL 1 MG/ML IJ SOLN
0.5000 mg | Freq: Once | INTRAMUSCULAR | Status: AC
  Administered 2024-01-23: 0.5 mg via INTRAVENOUS
  Filled 2024-01-23: qty 1

## 2024-01-23 MED ORDER — MIDAZOLAM HCL 2 MG/2ML IJ SOLN
INTRAMUSCULAR | Status: AC
  Filled 2024-01-23: qty 2

## 2024-01-23 MED ORDER — ONDANSETRON HCL 4 MG/2ML IJ SOLN: 4.0000 mg | Freq: Four times a day (QID) | INTRAMUSCULAR | Status: DC | PRN

## 2024-01-23 MED ORDER — FENTANYL CITRATE (PF) 100 MCG/2ML IJ SOLN
INTRAMUSCULAR | Status: AC | PRN
  Administered 2024-01-23: 25 ug via INTRAVENOUS

## 2024-01-23 MED ORDER — MIDAZOLAM HCL (PF) 2 MG/2ML IJ SOLN
INTRAMUSCULAR | Status: AC | PRN
  Administered 2024-01-23: 1 mg via INTRAVENOUS

## 2024-01-23 MED ORDER — ACETAMINOPHEN 650 MG RE SUPP: 325.0000 mg | Freq: Four times a day (QID) | RECTAL | Status: DC | PRN

## 2024-01-23 MED ORDER — HYDROMORPHONE HCL 1 MG/ML IJ SOLN
0.5000 mg | INTRAMUSCULAR | Status: DC | PRN
  Administered 2024-01-23: 1 mg via INTRAVENOUS
  Filled 2024-01-23: qty 1

## 2024-01-23 MED ORDER — ONDANSETRON HCL 4 MG PO TABS: 4.0000 mg | ORAL_TABLET | Freq: Four times a day (QID) | ORAL | Status: DC | PRN

## 2024-01-23 MED ORDER — FENTANYL CITRATE (PF) 100 MCG/2ML IJ SOLN
INTRAMUSCULAR | Status: AC
  Filled 2024-01-23: qty 2

## 2024-01-23 MED ORDER — IOHEXOL 350 MG/ML SOLN
100.0000 mL | Freq: Once | INTRAVENOUS | Status: AC | PRN
  Administered 2024-01-23: 100 mL via INTRAVENOUS

## 2024-01-23 MED ORDER — ACETAMINOPHEN 650 MG RE SUPP: 650.0000 mg | Freq: Four times a day (QID) | RECTAL | Status: DC | PRN

## 2024-01-23 MED ORDER — LIDOCAINE HCL 1 % IJ SOLN
INTRAMUSCULAR | Status: AC
  Filled 2024-01-23: qty 20

## 2024-01-23 MED ORDER — SODIUM CHLORIDE 0.9 % IV SOLN: INTRAVENOUS | Status: DC

## 2024-01-23 MED ORDER — TRAZODONE HCL 50 MG PO TABS
25.0000 mg | ORAL_TABLET | Freq: Every evening | ORAL | Status: DC | PRN
Start: 2024-01-23 — End: 2024-01-24
  Filled 2024-01-23: qty 1

## 2024-01-23 MED ORDER — FENTANYL CITRATE (PF) 50 MCG/ML IJ SOSY
50.0000 ug | PREFILLED_SYRINGE | Freq: Once | INTRAMUSCULAR | Status: AC
  Administered 2024-01-23: 50 ug via INTRAVENOUS
  Filled 2024-01-23: qty 1

## 2024-01-23 MED ORDER — LAMOTRIGINE 25 MG PO TABS
150.0000 mg | ORAL_TABLET | Freq: Every day | ORAL | Status: DC
  Administered 2024-01-23: 150 mg via ORAL
  Filled 2024-01-23: qty 2

## 2024-01-23 NOTE — Procedures (Signed)
 Interventional Radiology Procedure Note  Procedure: US  RANDOM RT LIVER PERIPHERAL CORE BX    Complications: None  Estimated Blood Loss:  MIN  Findings: 18 G CORE X 2 IN FORMALIN    EMERSON FREDERIC SPECKING, MD

## 2024-01-23 NOTE — Plan of Care (Signed)
  Problem: Education: Goal: Knowledge of General Education information will improve Description: Including pain rating scale, medication(s)/side effects and non-pharmacologic comfort measures 01/23/2024 2113 by Ellie Kirstie HERO, RN Outcome: Progressing   Problem: Health Behavior/Discharge Planning: Goal: Ability to manage health-related needs will improve 01/23/2024 2113 by Ellie Kirstie HERO, RN Outcome: Progressing  Problem: Clinical Measurements: Goal: Ability to maintain clinical measurements within normal limits will improve 01/23/2024 2113 by Ellie Kirstie HERO, RN Outcome: Progressing

## 2024-01-23 NOTE — H&P (Addendum)
 History and Physical  Isabella Holmes FMW:[id number] DOB: 1945/11/03 DOA: 01/23/2024  PCP: Lari Elspeth BRAVO, MD   Chief Complaint: Peritoneal hemorrhage  HPI: Isabella Holmes is a 78 y.o. female with medical history significant for primary biliary cirrhosis, hypothyroidism, GERD, hypertension being admitted to the hospital with small peritoneal hemorrhage after ultrasound-guided liver biopsy and incidental finding of new onset atrial fibrillation.  Patient states she has been doing well recently, without any recent palpitations, chest pain, nausea, vomiting, fevers chills or any other illness.  She is followed by [facility] for her PBC and managed with ursodiol  3 times daily.  Recently, recommendation was made for liver biopsy.  She underwent this without significant complication today, however just prior to being discharged home she started to develop pretty severe right upper quadrant abdominal pain with some radiation to the right shoulder.  She was sent to the ER for evaluation where CT scan as noted below shows evidence of a small amount of peritoneal hemorrhage, without active extravasation.  Patient is hemodynamically stable.  In the emergency department, she was also found to be in rate controlled A-fib and the patient denies any prior history of this.  Review of Systems: Please see HPI for pertinent positives and negatives. A complete 10 system review of systems are otherwise negative.  Past Medical History:  Diagnosis Date   Arthritis    Asthma    Chest pain    w abnormal ECg   Chronic headaches    migraine   COPD (chronic obstructive pulmonary disease) (HCC)    Dyslipidemia    Dyspnea    Dyspnea    GERD (gastroesophageal reflux disease)    Glucose intolerance (impaired glucose tolerance)    Hemorrhoids 03/25/2015   Hypothyroidism    Seizure disorder (HCC)    on Lamictal ; has not had seizure in 20 years   Seizures (HCC)    Sleep apnea    lost weight and does not  need CPAP any more.   Varicose veins 03/25/2015   Venous insufficiency    Past Surgical History:  Procedure Laterality Date   EXAM UNDER ANESTHESIA WITH MANIPULATION OF KNEE Right 04/05/2017   Procedure: EXAM UNDER ANESTHESIA WITH MANIPULATION OF RIGHT KNEE;  Surgeon: Margrette Taft BRAVO, MD;  Location: [facility];  Service: Orthopedics;  Laterality: Right;   KNEE ARTHROSCOPY Left    KNEE ARTHROSCOPY WITH MEDIAL MENISECTOMY Right 06/28/2016   Procedure: KNEE ARTHROSCOPY WITH MEDIAL MENISECTOMY;  Surgeon: Taft BRAVO Margrette, MD;  Location: [facility];  Service: Orthopedics;  Laterality: Right;   left vein stripping     status post bilateral tubaligation     TONSILLECTOMY     many years ago   TOTAL KNEE ARTHROPLASTY Right 02/13/2017   Procedure: TOTAL KNEE ARTHROPLASTY;  Surgeon: Margrette Taft BRAVO, MD;  Location: [facility];  Service: Orthopedics;  Laterality: Right;   TUBAL LIGATION     Social History:  reports that she quit smoking about 37 years ago. Her smoking use included cigarettes. She started smoking about 57 years ago. She has a 50 pack-year smoking history. She has been exposed to tobacco smoke. She has never used smokeless tobacco. She reports that she does not drink alcohol and does not use drugs.  Allergies  Allergen Reactions   Penicillins Itching and Rash    Has patient had a PCN reaction causing immediate rash, facial/tongue/throat swelling, SOB or lightheadedness with hypotension: No Has patient had a PCN reaction causing severe rash involving mucus membranes  or skin necrosis: No Has patient had a PCN reaction that required hospitalization No Has patient had a PCN reaction occurring within the last 10 years: No If all of the above answers are NO, then may proceed with Cephalosporin use.    Levofloxacin Other (See Comments)    Recommended by Dr due to family hx   Sulfa Antibiotics Itching   Streptomycin Rash   Sulfonamide Derivatives Rash    Family History  Problem Relation  Age of Onset   Heart attack Mother    Tuberculosis Father    Heart disease Father        cardiac disease   Cirrhosis Father    Factor V Leiden deficiency Other    Seizures Son      Prior to Admission medications   Medication Sig Start Date End Date Taking? Authorizing Provider  albuterol  (PROVENTIL  HFA;VENTOLIN  HFA) 108 (90 Base) MCG/ACT inhaler Inhale 1-2 puffs into the lungs every 4 (four) hours as needed for wheezing or shortness of breath.    [provider]  albuterol  (PROVENTIL ) (2.5 MG/3ML) 0.083% nebulizer solution Take 2.5 mg by nebulization every 6 (six) hours as needed for shortness of breath. 11/29/20   [provider]  atorvastatin (LIPITOR) 20 MG tablet Take 20 mg by mouth daily. 04/10/22   [provider]  famotidine (PEPCID) 20 MG tablet Take 20 mg by mouth 2 (two) times daily. 11/22/20   [provider]  FLUoxetine (PROZAC) 20 MG capsule Take 20 mg by mouth daily. 11/23/20   [provider]  furosemide  (LASIX ) 40 MG tablet Take 40 mg by mouth daily as needed for fluid.    [provider]  lamoTRIgine  (LAMICTAL ) 150 MG tablet Take 150 mg by mouth at bedtime.    [provider]  levothyroxine  (SYNTHROID , LEVOTHROID) 50 MCG tablet Take 50 mcg by mouth daily before breakfast.  05/19/16   [provider]  losartan (COZAAR) 100 MG tablet Take 100 mg by mouth daily. 04/10/22   [provider]  Multiple Vitamin (MULTIVITAMIN) tablet Take 1 tablet by mouth daily.    [provider]  pantoprazole (PROTONIX) 40 MG tablet Take 40 mg by mouth daily. 11/17/21   [provider]  ursodiol  (ACTIGALL ) 500 MG tablet TAKE ONE TABLET BY MOUTH 3 TIMES DAILY WITH FOOD 01/17/24   Carlan, Mitzie CROME, NP    Physical Exam: BP (!) 146/83   Pulse 99   Temp 98.1 F (36.7 C) (Oral)   Resp (!) 22   Ht 5' 5 (1.651 m)   Wt 104.3 kg   SpO2 99%   BMI 38.27 kg/m  General:  Alert, oriented, calm, in no acute  distress, her daughter and grandson are at the bedside. Cardiovascular: Irregularly irregular, no murmurs or rubs, no peripheral edema  Respiratory: clear to auscultation bilaterally, no wheezes, no crackles  Abdomen: soft, appropriately tender, nondistended, normal bowel tones heard  Skin: dry, no rashes  Musculoskeletal: no joint effusions, normal range of motion  Psychiatric: appropriate affect, normal speech  Neurologic: extraocular muscles intact, clear speech, moving all extremities with intact sensorium         Labs on Admission:  Basic Metabolic Panel: Recent Labs  Lab 01/23/24 1646 01/23/24 1700  NA 140 138  K 4.7 4.2  CL 104 107  CO2 27  --   GLUCOSE 97 100*  BUN 14 17  CREATININE 1.08* 1.10*  CALCIUM 9.9  --    Liver Function Tests: Recent Labs  Lab 01/23/24 1646  AST 25  ALT 14  ALKPHOS 105  BILITOT 0.9  PROT 6.9  ALBUMIN 4.3   Recent Labs  Lab 01/23/24 1646  LIPASE 43   No results for input(s): AMMONIA in the last 168 hours. CBC: Recent Labs  Lab 01/23/24 1205 01/23/24 1646 01/23/24 1700  WBC 4.4 4.4  --   NEUTROABS  --  2.1  --   HGB 13.2 13.6 14.3  HCT 40.4 43.3 42.0  MCV 92.0 92.9  --   PLT 206 194  --    Cardiac Enzymes: No results for input(s): CKTOTAL, CKMB, CKMBINDEX, TROPONINI in the last 168 hours. BNP (last 3 results) No results for input(s): BNP in the last 8760 hours.  ProBNP (last 3 results) No results for input(s): PROBNP in the last 8760 hours.  CBG: No results for input(s): GLUCAP in the last 168 hours.  Radiological Exams on Admission: DG Chest Portable 1 View Result Date: 01/23/2024 CLINICAL DATA:  Liver biopsy with abdominal pain EXAM: PORTABLE CHEST 1 VIEW COMPARISON:  CT 01/23/2024 FINDINGS: Stable cardiomediastinal silhouette with aortic atherosclerosis. Low lung volumes. No pleural effusion or pneumothorax. Limited assessment for free air on supine view. None seen on subsequently performed CT  angiography. IMPRESSION: Low lung volumes. No active disease. Electronically Signed   By: Luke Bun M.D.   On: 01/23/2024 18:13   CT Angio Abd/Pel W and/or Wo Contrast Result Date: 01/23/2024 CLINICAL DATA:  Liver biopsy this afternoon, severe abdominal pain EXAM: CTA ABDOMEN AND PELVIS WITHOUT AND WITH CONTRAST TECHNIQUE: Multidetector CT imaging of the abdomen and pelvis was performed using the standard protocol during bolus administration of intravenous contrast. Multiplanar reconstructed images and MIPs were obtained and reviewed to evaluate the vascular anatomy. RADIATION DOSE REDUCTION: This exam was performed according to the departmental dose-optimization program which includes automated exposure control, adjustment of the mA and/or kV according to patient size and/or use of iterative reconstruction technique. CONTRAST:  100mL OMNIPAQUE IOHEXOL 350 MG/ML SOLN COMPARISON:  None Available. FINDINGS: VASCULAR Normal contour and caliber of the abdominal aorta. No evidence of aneurysm, dissection, or other acute aortic pathology. Standard branching pattern of the abdominal aorta with solitary bilateral renal arteries. Moderate aortic atherosclerosis. Review of the MIP images confirms the above findings. NON-VASCULAR Lower Chest: No acute findings. Hepatobiliary: No solid liver abnormality is seen. Coarse, nodular cirrhotic morphology of the liver. No parenchymal hematoma or arterial extravasation. Small gallstone. No gallbladder wall thickening, or biliary dilatation. Pancreas: Unremarkable. No pancreatic ductal dilatation or surrounding inflammatory changes. Spleen: Normal in size without significant abnormality. Adrenals/Urinary Tract: Adrenal glands are unremarkable. Kidneys are normal, without renal calculi, solid lesion, or hydronephrosis. Bladder is unremarkable. Stomach/Bowel: Stomach is within normal limits. Appendix appears normal. No evidence of bowel wall thickening, distention, or inflammatory  changes. Sigmoid diverticulosis. Lymphatic: Enlarged portacaval lymph nodes measuring up 2.4 x 1.3 cm Reproductive: No mass or other significant abnormality. Other: No abdominal wall hernia. Superficial soft tissue stranding in the right upper quadrant in keeping with percutaneous liver biopsy (series 4, image 90). Trace intermediate attenuation perihepatic ascites measuring up to 1.3 cm in thickness (series 4, image 49). No evidence of arterial extravasation. Musculoskeletal: No acute osseous findings. IMPRESSION: 1. Superficial soft tissue stranding in the right upper quadrant in keeping with percutaneous liver biopsy. 2. Trace intermediate attenuation perihepatic ascites measuring up to 1.3 cm in thickness, consistent with small volume hemoperitoneum. No evidence of contrast extravasation within the peritoneum or within the liver parenchyma.  Consider short interval follow-up to ensure stability. 3. Cirrhosis. 4. Cholelithiasis. 5. Enlarged portacaval lymph nodes, likely reactive in the setting of cirrhosis. Aortic Atherosclerosis (ICD10-I70.0). Electronically Signed   By: Marolyn JONETTA Jaksch M.D.   On: 01/23/2024 18:00   US  Abdomen Limited Result Date: 01/23/2024 CLINICAL DATA:  Acute epigastric abdominal pain following right inferior peripheral liver biopsy EXAM: ULTRASOUND ABDOMEN LIMITED COMPARISON:  01/23/2024 FINDINGS: Ultrasound performed of the liver and survey of the abdomen performed to assess for free fluid. Stable heterogeneous appearance of the liver. No surrounding perihepatic free fluid, fluid collection, or subcapsular collection. Biopsy site in the right inferior liver is unremarkable. Survey of the abdominal 4 quadrants demonstrates no free fluid. IMPRESSION: No acute or significant finding by limited abdominal ultrasound Electronically Signed   By: CHRISTELLA.  Shick M.D.   On: 01/23/2024 17:18   US  BIOPSY (LIVER) Result Date: 01/23/2024 INDICATION: Cirrhosis, assess for primary biliary cholangitis  EXAM: ULTRASOUND RIGHT LIVER RANDOM PERIPHERAL CORE BIOPSY MEDICATIONS: 1% LIDOCAINE  LOCAL ANESTHESIA/SEDATION: Moderate (conscious) sedation was employed during this procedure. A total of Versed  1.0 mg and Fentanyl  25 mcg was administered intravenously by the radiology nurse. Total intra-service moderate Sedation Time: 7 minutes. The patient's level of consciousness and vital signs were monitored continuously by radiology nursing throughout the procedure under my direct supervision. COMPLICATIONS: None immediate. PROCEDURE: Informed written consent was obtained from the patient after a thorough discussion of the procedural risks, benefits and alternatives. All questions were addressed. Maximal Sterile Barrier Technique was utilized including caps, mask, sterile gowns, sterile gloves, sterile drape, hand hygiene and skin antiseptic. A timeout was performed prior to the initiation of the procedure. Preliminary ultrasound performed. Inferior right liver was localized below the right subcostal margin. Overlying skin marked in the mid axillary line through a lower intercostal space. Under sterile conditions and local anesthesia, the 17 gauge guide needle was advanced into the periphery of the right inferior liver. Needle position confirmed with ultrasound. Images obtained for documentation. 2 18 gauge core biopsies obtained through the access. These were intact and non fragmented. Samples placed in formalin. Needle tract occluded with Gel-Foam. Postprocedure imaging demonstrates no hemorrhage or hematoma. Patient tolerated the biopsy well. IMPRESSION: Successful ultrasound right inferior liver random core biopsy peripherally Electronically Signed   By: CHRISTELLA.  Shick M.D.   On: 01/23/2024 14:29   Assessment/Plan Isabella Holmes is a 78 y.o. female with medical history significant for primary biliary cirrhosis, hypothyroidism, GERD, hypertension being admitted to the hospital with small peritoneal hemorrhage after  ultrasound-guided liver biopsy and incidental finding of new onset atrial fibrillation.   Peritoneal hemorrhage-trace perihepatic ascites consistent with small volume hemoperitoneum.  No evidence of contrast extravasation or evidence of active hemorrhage.  Patient is hemodynamically stable and not anemic. -Observation admission -Avoid heavy lifting, straining, or blood thinners -Pain control as needed -In case of clinical status change including significant tachycardia, hypotension, worsening abdominal pain or other evidence of acute extravasation, will obtain stat CT and consult IR  Atrial fibrillation-patient has no prior history of this, most likely exacerbated by her acute pain and recent invasive intervention.  While this is the most likely cause, we will rule out other causes.  If atrial fibrillation proves to be persistent, she may require cardiology evaluation and anticoagulation.  However would not be a candidate for anticoagulation currently in any case. -Check TSH -2D echo -Monitor on telemetry  PBC-continue ursodiol  500 mg p.o. 3 times daily  Hypertension-continue home losartan  Stage IIIa CKD-appears to  be at baseline  Hyperlipidemia-Lipitor  Depression-Prozac, Lamictal   Hypothyroidism-Synthroid   DVT prophylaxis: SCDs only    Code Status: Full Code  Consults called: None  Admission status: Observation  Time spent: 49 minutes  Korver Graybeal CHRISTELLA Gail MD [facility] Pager [phone]  If 7PM-7AM, please contact night-coverage [email or web address] Password [facility]  01/23/2024, 7:23 PM

## 2024-01-23 NOTE — Discharge Instructions (Signed)
Liver Biopsy, Care After  May remove dressing or bandaid and shower tomorrow.  Keep site clean and dry. Replace with clean dressing or bandaid as necessary.   Urgent needs - Interventional Radiology clinic [phone]  After a liver biopsy, it is common to have these things in the area where the biopsy was done. You may: Have pain. Feel sore. Have bruising. You may also feel tired for a few days. Follow these instructions at home: Medicines Take over-the-counter and prescription medicines only as told by your doctor. If you were prescribed an antibiotic medicine, take it as told by your doctor. Do not stop taking the antibiotic, even if you start to feel better. Do not take medicines that may thin your blood. These medicines include aspirin and ibuprofen. Take them only if your doctor tells you to. If told, take steps to prevent problems with pooping (constipation). You may need to: Drink enough fluid to keep your pee (urine) pale yellow. Take medicines. You will be told what medicines to take. Eat foods that are high in fiber. These include beans, whole grains, and fresh fruits and vegetables. Limit foods that are high in fat and sugar. These include fried or sweet foods. Ask your doctor if you should avoid driving or using machines while you are taking your medicine. Caring for your incision Follow instructions from your doctor about how to take care of your cut from surgery (incisions). Make sure you: Wash your hands with soap and water for at least 20 seconds before and after you change your bandage. If you cannot use soap and water, use hand sanitizer. Change your bandage. Leavestitches or skin glue in place for at least two weeks. Leave tape strips alone unless you are told to take them off. You may trim the edges of the tape strips if they curl up. Check your incision every day for signs of infection. Check for: Redness, swelling, or more pain. Fluid or blood. Warmth. Pus or a  bad smell. Do not take baths, swim, or use a hot tub. Ask your doctor about taking showers or sponge baths. Activity Rest at home for 1-2 days, or as told by your doctor. Get up to take short walks every 1 to 2 hours. Ask for help if you feel weak or unsteady. Do not lift anything that is heavier than 10 lb (4.5 kg), or the limit that you are told. Do not play contact sports for 2 weeks after the procedure. Return to your normal activities as told by your doctor. Ask what activities are safe for you. General instructions Do not drink alcohol in the first week after the procedure. Plan to have a responsible adult care for you for the time you are told after you leave the hospital or clinic. This is important. It is up to you to get the results of your procedure. Ask how to get your results when they are ready. Keep all follow-up visits.   Contact a doctor if: You have more bleeding in your incision. Your incision swells, or is red and more painful. You have fluid that comes from your incision. You develop a rash. You have fever or chills. Get help right away if: You have swelling, bloating, or pain in your belly (abdomen). You get dizzy or faint. You vomit or you feel like vomiting. You have trouble breathing or feel short of breath. You have chest pain. You have problems talking or seeing. You have trouble with your balance or moving your arms or   legs. These symptoms may be an emergency. Get help right away. Call your local emergency services (911 in the [place] Do not wait to see if the symptoms will go away. Do not drive yourself to the hospital. Summary After the procedure, it is common to have pain, soreness, bruising, and tiredness. Your doctor will tell you how to take care of yourself at home. Change your bandage, take your medicines, and limit your activities as told by your doctor. Call your doctor if you have symptoms of infection. Get help right away if your belly swells,  your cut bleeds a lot, or you have trouble talking or breathing. This information is not intended to replace advice given to you by your health care provider. Make sure you discuss any questions you have with your healthcare provider. Document Revised: 01/12/2020 Document Reviewed: 01/12/2020 Elsevier Patient Education  2022 [employer]  Moderate Conscious Sedation, Adult, Care After This sheet gives you information about how to care for yourself after your procedure. Your health care provider may also give you more specific instructions. If you have problems or questions, contact your health careprovider. What can I expect after the procedure? After the procedure, it is common to have: Sleepiness for several hours. Impaired judgment for several hours. Difficulty with balance. Vomiting if you eat too soon. Follow these instructions at home: For the time period you were told by your health care provider: Rest. Do not participate in activities where you could fall or become injured. Do not drive or use machinery. Do not drink alcohol. Do not take sleeping pills or medicines that cause drowsiness. Do not make important decisions or sign legal documents. Do not take care of children on your own. Eating and drinking  Follow the diet recommended by your health care provider. Drink enough fluid to keep your urine pale yellow. If you vomit: Drink water, juice, or soup when you can drink without vomiting. Make sure you have little or no nausea before eating solid foods.  General instructions Take over-the-counter and prescription medicines only as told by your health care provider. Have a responsible adult stay with you for the time you are told. It is important to have someone help care for you until you are awake and alert. Do not smoke. Keep all follow-up visits as told by your health care provider. This is important. Contact a health care provider if: You are still sleepy or having  trouble with balance after 24 hours. You feel light-headed. You keep feeling nauseous or you keep vomiting. You develop a rash. You have a fever. You have redness or swelling around the IV site. Get help right away if: You have trouble breathing. You have new-onset confusion at home. Summary After the procedure, it is common to feel sleepy, have impaired judgment, or feel nauseous if you eat too soon. Rest after you get home. Know the things you should not do after the procedure. Follow the diet recommended by your health care provider and drink enough fluid to keep your urine pale yellow. Get help right away if you have trouble breathing or new-onset confusion at home. This information is not intended to replace advice given to you by your health care provider. Make sure you discuss any questions you have with your healthcare provider. Document Revised: 06/27/2019 Document Reviewed: 01/23/2019 Elsevier Patient Education  2022 [employer]        

## 2024-01-23 NOTE — ED Provider Notes (Signed)
 [place] EMERGENCY DEPARTMENT AT [facility] Provider Note  CSN: [id number] Arrival date & time: 01/23/24 1644  Chief Complaint(s) Abdominal Pain and Palpitations  HPI Isabella Holmes is a 78 y.o. female who is here today after she began to have increasing pain, atrial fibrillation following a liver biopsy for primary biliary cholangitis.  Patient was doing well in the postop area for approximately 2 hours before she began to experience severe pain, began to show an irregular heart rate.  She does not have a history of atrial fibrillation.  No blood thinners.  Spoke with interventional radiologist Dr. Vanice who informed me of the patient prior to her arrival in the emergency room.   Past Medical History Past Medical History:  Diagnosis Date   Arthritis    Asthma    Chest pain    w abnormal ECg   Chronic headaches    migraine   COPD (chronic obstructive pulmonary disease) (HCC)    Dyslipidemia    Dyspnea    Dyspnea    GERD (gastroesophageal reflux disease)    Glucose intolerance (impaired glucose tolerance)    Hemorrhoids 03/25/2015   Hypothyroidism    Seizure disorder (HCC)    on Lamictal ; has not had seizure in 20 years   Seizures (HCC)    Sleep apnea    lost weight and does not need CPAP any more.   Varicose veins 03/25/2015   Venous insufficiency    Patient Active Problem List   Diagnosis Date Noted   Fatigue 12/18/2023   Liver fibrosis 04/23/2023   Primary biliary cholangitis (HCC) 04/11/2022   Chronic cough 01/23/2022   Arthrofibrosis of total knee replacement right (HCC) s/p Manipulation 04/05/17    S/P total knee replacement, right 02/13/17 02/27/2017   Primary osteoarthritis of one knee, right 02/13/2017   Encounter for vitamin deficiency screening 01/31/2017   Derangement of posterior horn of medial meniscus of right knee    Varicose veins 03/25/2015   Hemorrhoids 03/25/2015   Vulvar abscess 02/12/2015   Overweight 01/06/2009   Anxiety state  01/06/2009   Essential hypertension 01/06/2009   ESOPHAGEAL REFLUX 01/06/2009   SEIZURE DISORDER 01/06/2009   Hypersomnia with sleep apnea 01/06/2009   Sleep apnea 01/06/2009   OTHER CHEST PAIN 01/06/2009   Home Medication(s) Prior to Admission medications   Medication Sig Start Date End Date Taking? Authorizing Provider  albuterol  (PROVENTIL  HFA;VENTOLIN  HFA) 108 (90 Base) MCG/ACT inhaler Inhale 1-2 puffs into the lungs every 4 (four) hours as needed for wheezing or shortness of breath.    [provider]  albuterol  (PROVENTIL ) (2.5 MG/3ML) 0.083% nebulizer solution Take 2.5 mg by nebulization every 6 (six) hours as needed for shortness of breath. 11/29/20   [provider]  atorvastatin (LIPITOR) 20 MG tablet Take 20 mg by mouth daily. 04/10/22   [provider]  famotidine (PEPCID) 20 MG tablet Take 20 mg by mouth 2 (two) times daily. 11/22/20   [provider]  FLUoxetine (PROZAC) 20 MG capsule Take 20 mg by mouth daily. 11/23/20   [provider]  furosemide  (LASIX ) 40 MG tablet Take 40 mg by mouth daily as needed for fluid.    [provider]  lamoTRIgine  (LAMICTAL ) 150 MG tablet Take 150 mg by mouth at bedtime.    [provider]  levothyroxine  (SYNTHROID , LEVOTHROID) 50 MCG tablet Take 50 mcg by mouth daily before breakfast.  05/19/16   [provider]  losartan (COZAAR) 100 MG tablet Take 100 mg  by mouth daily. 04/10/22   [provider]  Multiple Vitamin (MULTIVITAMIN) tablet Take 1 tablet by mouth daily.    [provider]  pantoprazole (PROTONIX) 40 MG tablet Take 40 mg by mouth daily. 11/17/21   [provider]  ursodiol  (ACTIGALL ) 500 MG tablet TAKE ONE TABLET BY MOUTH 3 TIMES DAILY WITH FOOD 01/17/24   Mariette Mitzie CROME, NP                                                                                                                                    Past Surgical History Past Surgical  History:  Procedure Laterality Date   EXAM UNDER ANESTHESIA WITH MANIPULATION OF KNEE Right 04/05/2017   Procedure: EXAM UNDER ANESTHESIA WITH MANIPULATION OF RIGHT KNEE;  Surgeon: Margrette Taft BRAVO, MD;  Location: [facility];  Service: Orthopedics;  Laterality: Right;   KNEE ARTHROSCOPY Left    KNEE ARTHROSCOPY WITH MEDIAL MENISECTOMY Right 06/28/2016   Procedure: KNEE ARTHROSCOPY WITH MEDIAL MENISECTOMY;  Surgeon: Taft BRAVO Margrette, MD;  Location: [facility];  Service: Orthopedics;  Laterality: Right;   left vein stripping     status post bilateral tubaligation     TONSILLECTOMY     many years ago   TOTAL KNEE ARTHROPLASTY Right 02/13/2017   Procedure: TOTAL KNEE ARTHROPLASTY;  Surgeon: Margrette Taft BRAVO, MD;  Location: [facility];  Service: Orthopedics;  Laterality: Right;   TUBAL LIGATION     Family History Family History  Problem Relation Age of Onset   Heart attack Mother    Tuberculosis Father    Heart disease Father        cardiac disease   Cirrhosis Father    Factor V Leiden deficiency Other    Seizures Son     Social History Social History   Tobacco Use   Smoking status: Former    Current packs/day: 0.00    Average packs/day: 2.5 packs/day for 20.0 years (50.0 ttl pk-yrs)    Types: Cigarettes    Start date: 06/23/1966    Quit date: 06/23/1986    Years since quitting: 37.6    Passive exposure: Past   Smokeless tobacco: Never  Vaping Use   Vaping status: Never Used  Substance Use Topics   Alcohol use: No   Drug use: No   Allergies Penicillins, Levofloxacin, Sulfa antibiotics, Streptomycin, and Sulfonamide derivatives  Review of Systems Review of Systems  Physical Exam Vital Signs  I have reviewed the triage vital signs BP (!) 146/83   Pulse 99   Temp 98.1 F (36.7 C) (Oral)   Resp (!) 22   Ht 5' 5 (1.651 m)   Wt 104.3 kg   SpO2 99%   BMI 38.27 kg/m   Physical Exam Vitals and nursing note reviewed.  Constitutional:      Appearance: She is  ill-appearing. She is not toxic-appearing.  Cardiovascular:     Rate and Rhythm:  Tachycardia present. Rhythm irregular.     Heart sounds: Normal heart sounds.  Pulmonary:     Effort: Pulmonary effort is normal.  Abdominal:     General: Abdomen is flat.     Palpations: Abdomen is soft.     Tenderness: There is abdominal tenderness in the right upper quadrant, epigastric area and left upper quadrant.     Hernia: No hernia is present.  Skin:    General: Skin is warm.  Neurological:     General: No focal deficit present.     Mental Status: She is alert.     ED Results and Treatments Labs (all labs ordered are listed, but only abnormal results are displayed) Labs Reviewed  COMPREHENSIVE METABOLIC PANEL WITH GFR - Abnormal; Notable for the following components:      Result Value   Creatinine, Ser 1.08 (*)    GFR, Estimated 53 (*)    All other components within normal limits  I-STAT CHEM 8, ED - Abnormal; Notable for the following components:   Creatinine, Ser 1.10 (*)    Glucose, Bld 100 (*)    Calcium, Ion 0.99 (*)    All other components within normal limits  CBC WITH DIFFERENTIAL/PLATELET  LIPASE, BLOOD                                                                                                                          Radiology DG Chest Portable 1 View Result Date: 01/23/2024 CLINICAL DATA:  Liver biopsy with abdominal pain EXAM: PORTABLE CHEST 1 VIEW COMPARISON:  CT 01/23/2024 FINDINGS: Stable cardiomediastinal silhouette with aortic atherosclerosis. Low lung volumes. No pleural effusion or pneumothorax. Limited assessment for free air on supine view. None seen on subsequently performed CT angiography. IMPRESSION: Low lung volumes. No active disease. Electronically Signed   By: Luke Bun M.D.   On: 01/23/2024 18:13   CT Angio Abd/Pel W and/or Wo Contrast Result Date: 01/23/2024 CLINICAL DATA:  Liver biopsy this afternoon, severe abdominal pain EXAM: CTA ABDOMEN AND  PELVIS WITHOUT AND WITH CONTRAST TECHNIQUE: Multidetector CT imaging of the abdomen and pelvis was performed using the standard protocol during bolus administration of intravenous contrast. Multiplanar reconstructed images and MIPs were obtained and reviewed to evaluate the vascular anatomy. RADIATION DOSE REDUCTION: This exam was performed according to the departmental dose-optimization program which includes automated exposure control, adjustment of the mA and/or kV according to patient size and/or use of iterative reconstruction technique. CONTRAST:  100mL OMNIPAQUE IOHEXOL 350 MG/ML SOLN COMPARISON:  None Available. FINDINGS: VASCULAR Normal contour and caliber of the abdominal aorta. No evidence of aneurysm, dissection, or other acute aortic pathology. Standard branching pattern of the abdominal aorta with solitary bilateral renal arteries. Moderate aortic atherosclerosis. Review of the MIP images confirms the above findings. NON-VASCULAR Lower Chest: No acute findings. Hepatobiliary: No solid liver abnormality is seen. Coarse, nodular cirrhotic morphology of the liver. No parenchymal hematoma or arterial extravasation. Small gallstone. No gallbladder wall thickening, or  biliary dilatation. Pancreas: Unremarkable. No pancreatic ductal dilatation or surrounding inflammatory changes. Spleen: Normal in size without significant abnormality. Adrenals/Urinary Tract: Adrenal glands are unremarkable. Kidneys are normal, without renal calculi, solid lesion, or hydronephrosis. Bladder is unremarkable. Stomach/Bowel: Stomach is within normal limits. Appendix appears normal. No evidence of bowel wall thickening, distention, or inflammatory changes. Sigmoid diverticulosis. Lymphatic: Enlarged portacaval lymph nodes measuring up 2.4 x 1.3 cm Reproductive: No mass or other significant abnormality. Other: No abdominal wall hernia. Superficial soft tissue stranding in the right upper quadrant in keeping with percutaneous liver  biopsy (series 4, image 90). Trace intermediate attenuation perihepatic ascites measuring up to 1.3 cm in thickness (series 4, image 49). No evidence of arterial extravasation. Musculoskeletal: No acute osseous findings. IMPRESSION: 1. Superficial soft tissue stranding in the right upper quadrant in keeping with percutaneous liver biopsy. 2. Trace intermediate attenuation perihepatic ascites measuring up to 1.3 cm in thickness, consistent with small volume hemoperitoneum. No evidence of contrast extravasation within the peritoneum or within the liver parenchyma. Consider short interval follow-up to ensure stability. 3. Cirrhosis. 4. Cholelithiasis. 5. Enlarged portacaval lymph nodes, likely reactive in the setting of cirrhosis. Aortic Atherosclerosis (ICD10-I70.0). Electronically Signed   By: Marolyn JONETTA Jaksch M.D.   On: 01/23/2024 18:00   US  Abdomen Limited Result Date: 01/23/2024 CLINICAL DATA:  Acute epigastric abdominal pain following right inferior peripheral liver biopsy EXAM: ULTRASOUND ABDOMEN LIMITED COMPARISON:  01/23/2024 FINDINGS: Ultrasound performed of the liver and survey of the abdomen performed to assess for free fluid. Stable heterogeneous appearance of the liver. No surrounding perihepatic free fluid, fluid collection, or subcapsular collection. Biopsy site in the right inferior liver is unremarkable. Survey of the abdominal 4 quadrants demonstrates no free fluid. IMPRESSION: No acute or significant finding by limited abdominal ultrasound Electronically Signed   By: CHRISTELLA.  Shick M.D.   On: 01/23/2024 17:18   US  BIOPSY (LIVER) Result Date: 01/23/2024 INDICATION: Cirrhosis, assess for primary biliary cholangitis EXAM: ULTRASOUND RIGHT LIVER RANDOM PERIPHERAL CORE BIOPSY MEDICATIONS: 1% LIDOCAINE  LOCAL ANESTHESIA/SEDATION: Moderate (conscious) sedation was employed during this procedure. A total of Versed  1.0 mg and Fentanyl  25 mcg was administered intravenously by the radiology nurse. Total  intra-service moderate Sedation Time: 7 minutes. The patient's level of consciousness and vital signs were monitored continuously by radiology nursing throughout the procedure under my direct supervision. COMPLICATIONS: None immediate. PROCEDURE: Informed written consent was obtained from the patient after a thorough discussion of the procedural risks, benefits and alternatives. All questions were addressed. Maximal Sterile Barrier Technique was utilized including caps, mask, sterile gowns, sterile gloves, sterile drape, hand hygiene and skin antiseptic. A timeout was performed prior to the initiation of the procedure. Preliminary ultrasound performed. Inferior right liver was localized below the right subcostal margin. Overlying skin marked in the mid axillary line through a lower intercostal space. Under sterile conditions and local anesthesia, the 17 gauge guide needle was advanced into the periphery of the right inferior liver. Needle position confirmed with ultrasound. Images obtained for documentation. 2 18 gauge core biopsies obtained through the access. These were intact and non fragmented. Samples placed in formalin. Needle tract occluded with Gel-Foam. Postprocedure imaging demonstrates no hemorrhage or hematoma. Patient tolerated the biopsy well. IMPRESSION: Successful ultrasound right inferior liver random core biopsy peripherally Electronically Signed   By: CHRISTELLA.  Shick M.D.   On: 01/23/2024 14:29    Pertinent labs & imaging results that were available during my care of the patient were reviewed by me and considered in  my medical decision making (see MDM for details).  Medications Ordered in ED Medications  fentaNYL  (SUBLIMAZE ) injection 50 mcg (50 mcg Intravenous Given 01/23/24 1710)  iohexol (OMNIPAQUE) 350 MG/ML injection 100 mL (100 mLs Intravenous Contrast Given 01/23/24 1719)  HYDROmorphone  (DILAUDID ) injection 0.5 mg (0.5 mg Intravenous Given 01/23/24 1831)                                                                                                                                      Procedures .Critical Care  Performed by: Mannie Fairy DASEN, DO Authorized by: Mannie Fairy DASEN, DO   Critical care provider statement:    Critical care time (minutes):  35   Critical care was time spent personally by me on the following activities:  Development of treatment plan with patient or surrogate, discussions with consultants, evaluation of patient's response to treatment, examination of patient, ordering and review of laboratory studies, ordering and review of radiographic studies, ordering and performing treatments and interventions, pulse oximetry, re-evaluation of patient's condition and review of old charts   (including critical care time)  Medical Decision Making / ED Course   This patient presents to the ED for concern of abdominal pain after liver biopsy, this involves an extensive number of treatment options, and is a complaint that carries with it a high risk of complications and morbidity.  The differential diagnosis includes post biopsy bleeding, postoperative pain, bowel obstruction, Coley lithiasis, cholecystitis, gastritis, peptic ulcer disease, less likely ACS, atrial fibrillation. MDM: Patient quite tender abdomen, given recent instrumentation, will obtain CTA.  Patient in atrial fibrillation, however her rate is controlled.  Believe this is likely stress response to what ever the underlying cause of her pain is.  I have provided her with some fentanyl .  Reassessment 6:50 PM-patient's hemoglobin is stable.  Her CT imaging shows trace hemoperitoneum, likely the cause of her pain.  She still remains fairly uncomfortable, has pain with a deep breath.  She received some IV medication, which did cause her to saturations to drop slightly.  Have placed her on 2 L of oxygen.  Given the patient's discomfort, I believe she is going to require admission for pain control.  Patient  remains in A-fib in the 80s and 90s.  Again, believe this is likely stress response will not aggressively treat.  Will admit to hospitalist service.  Additional history obtained: -Additional history obtained from family at bedside -External records from outside source obtained and reviewed including: Chart review including previous notes, labs, imaging, consultation notes   Lab Tests: -I ordered, reviewed, and interpreted labs.   The pertinent results include:   Labs Reviewed  COMPREHENSIVE METABOLIC PANEL WITH GFR - Abnormal; Notable for the following components:      Result Value   Creatinine, Ser 1.08 (*)    GFR, Estimated 53 (*)    All other components within normal limits  I-STAT CHEM 8, ED -  Abnormal; Notable for the following components:   Creatinine, Ser 1.10 (*)    Glucose, Bld 100 (*)    Calcium, Ion 0.99 (*)    All other components within normal limits  CBC WITH DIFFERENTIAL/PLATELET  LIPASE, BLOOD       Imaging Studies ordered: I ordered imaging studies including CT abdomen pelvis I independently visualized and interpreted imaging. I agree with the radiologist interpretation   Medicines ordered and prescription drug management: Meds ordered this encounter  Medications   fentaNYL  (SUBLIMAZE ) injection 50 mcg   iohexol (OMNIPAQUE) 350 MG/ML injection 100 mL   HYDROmorphone  (DILAUDID ) injection 0.5 mg    -I have reviewed the patients home medicines and have made adjustments as needed  Cardiac Monitoring: The patient was maintained on a cardiac monitor.  I personally viewed and interpreted the cardiac monitored which showed an underlying rhythm of: Normal sinus rhythm   Reevaluation: After the interventions noted above, I reevaluated the patient and found that they have :improved  Co morbidities that complicate the patient evaluation  Past Medical History:  Diagnosis Date   Arthritis    Asthma    Chest pain    w abnormal ECg   Chronic headaches     migraine   COPD (chronic obstructive pulmonary disease) (HCC)    Dyslipidemia    Dyspnea    Dyspnea    GERD (gastroesophageal reflux disease)    Glucose intolerance (impaired glucose tolerance)    Hemorrhoids 03/25/2015   Hypothyroidism    Seizure disorder (HCC)    on Lamictal ; has not had seizure in 20 years   Seizures (HCC)    Sleep apnea    lost weight and does not need CPAP any more.   Varicose veins 03/25/2015   Venous insufficiency         Final Clinical Impression(s) / ED Diagnoses Final diagnoses:  Hemoperitoneum     @PCDICTATION @    Mannie Pac T, DO 01/23/24 1851

## 2024-01-23 NOTE — Progress Notes (Addendum)
 Patient ID: Isabella Holmes, female   DOB: [birth year], 78 y.o.   MRN: [id number]  Patient underwent uncomplicated liver biopsy today with Dr. Vanice in IR. Patient was recovering well for several hours when she had sudden onset of pain in her abdomen, primarily RUQ and epigastric with some radiation to the right shoulder. Bedside US  performed of liver biopsy site with no evidence of bleeding, free fluid, or other abnormality. Patient was also noted to be in AFIB, which is new from pre-procedure. Heart rate ranging from 100-120.  Given these findings, patient to be sent to ER for acute evaluation. Short stay nurse called and spoke with charge nurse to make aware.  Please contact Dr. Vanice or IR MD on call for any questions.   Kimble VEAR Clas PA-C 01/23/2024 4:33 PM

## 2024-01-23 NOTE — ED Triage Notes (Signed)
 Pt was in IR having a liver biopsy. Post procedure pt was doing ok and then at around 1500 pt began having severe upper abd pain. Biopsy site has saturated bandaid but is not bleeding at this time. Denies CP. Pain was radiating to right shoulder but is now only inABD

## 2024-01-23 NOTE — H&P (Signed)
 Chief Complaint: Primary biliary cholangitis  - IR consulted for random liver biopsy  Referring Provider(s): Carlan, Mitzie CROME, NP  Supervising Physician: Vanice Revel  Patient Status: [facility] - Out-pt  History of Present Illness: Isabella Holmes is a 78 y.o. female with pmhx of arthritis, asthma, COPD, dyslipidemia, GERD, PBC, seizures, Sleep apnea. He has a long hx of primary biliary cholangitis with pruritus, followed by GI for this and has been treated with Ursodiol . Pt last seen by GI 12/03/23 and reviewed RUQ US  from 05/18/23 that showed cirrhotic morphology along with ELF testing done 11/18/23 with score of 10.22 - GI unsure if advanced fibrosis vs early cirrhosis. IR has now been consulted for image guided random liver biopsy for further evaluation.  Today patient without complaint. Has been NPO since midnight. Denies anticoagulation use.   Patient is Full Code  Past Medical History:  Diagnosis Date   Arthritis    Asthma    Chest pain    w abnormal ECg   Chronic headaches    migraine   COPD (chronic obstructive pulmonary disease) (HCC)    Dyslipidemia    Dyspnea    Dyspnea    GERD (gastroesophageal reflux disease)    Glucose intolerance (impaired glucose tolerance)    Hemorrhoids 03/25/2015   Hypothyroidism    Seizure disorder (HCC)    on Lamictal ; has not had seizure in 20 years   Seizures (HCC)    Sleep apnea    lost weight and does not need CPAP any more.   Varicose veins 03/25/2015   Venous insufficiency     Past Surgical History:  Procedure Laterality Date   EXAM UNDER ANESTHESIA WITH MANIPULATION OF KNEE Right 04/05/2017   Procedure: EXAM UNDER ANESTHESIA WITH MANIPULATION OF RIGHT KNEE;  Surgeon: Margrette Taft BRAVO, MD;  Location: [facility];  Service: Orthopedics;  Laterality: Right;   KNEE ARTHROSCOPY Left    KNEE ARTHROSCOPY WITH MEDIAL MENISECTOMY Right 06/28/2016   Procedure: KNEE ARTHROSCOPY WITH MEDIAL MENISECTOMY;  Surgeon: Taft BRAVO Margrette, MD;   Location: [facility];  Service: Orthopedics;  Laterality: Right;   left vein stripping     status post bilateral tubaligation     TONSILLECTOMY     many years ago   TOTAL KNEE ARTHROPLASTY Right 02/13/2017   Procedure: TOTAL KNEE ARTHROPLASTY;  Surgeon: Margrette Taft BRAVO, MD;  Location: [facility];  Service: Orthopedics;  Laterality: Right;   TUBAL LIGATION      Allergies: Penicillins, Levofloxacin, Sulfa antibiotics, Streptomycin, and Sulfonamide derivatives  Medications: Prior to Admission medications   Medication Sig Start Date End Date Taking? Authorizing Provider  albuterol  (PROVENTIL  HFA;VENTOLIN  HFA) 108 (90 Base) MCG/ACT inhaler Inhale 1-2 puffs into the lungs every 4 (four) hours as needed for wheezing or shortness of breath.    [provider]  albuterol  (PROVENTIL ) (2.5 MG/3ML) 0.083% nebulizer solution Take 2.5 mg by nebulization every 6 (six) hours as needed for shortness of breath. 11/29/20   [provider]  atorvastatin (LIPITOR) 20 MG tablet Take 20 mg by mouth daily. 04/10/22   [provider]  famotidine (PEPCID) 20 MG tablet Take 20 mg by mouth 2 (two) times daily. 11/22/20   [provider]  FLUoxetine (PROZAC) 20 MG capsule Take 20 mg by mouth daily. 11/23/20   [provider]  furosemide  (LASIX ) 40 MG tablet Take 40 mg by mouth daily as needed for fluid.    [provider]  lamoTRIgine  (LAMICTAL ) 150 MG tablet Take 150  mg by mouth at bedtime.    [provider]  levothyroxine  (SYNTHROID , LEVOTHROID) 50 MCG tablet Take 50 mcg by mouth daily before breakfast.  05/19/16   [provider]  losartan (COZAAR) 100 MG tablet Take 100 mg by mouth daily. 04/10/22   [provider]  meloxicam  (MOBIC ) 7.5 MG tablet TAKE 1 TABLET BY MOUTH DAILY 04/18/23   Brenna Lin, MD  Multiple Vitamin (MULTIVITAMIN) tablet Take 1 tablet by mouth daily.    [provider]  pantoprazole (PROTONIX) 40 MG tablet Take 40  mg by mouth daily. 11/17/21   [provider]  ursodiol  (ACTIGALL ) 500 MG tablet TAKE ONE TABLET BY MOUTH 3 TIMES DAILY WITH FOOD 01/17/24   Carlan, Mitzie CROME, NP     Family History  Problem Relation Age of Onset   Heart attack Mother    Tuberculosis Father    Heart disease Father        cardiac disease   Cirrhosis Father    Factor V Leiden deficiency Other    Seizures Son     Social History   Socioeconomic History   Marital status: Married    Spouse name: Not on file   Number of children: Not on file   Years of education: Not on file   Highest education level: Not on file  Occupational History   Not on file  Tobacco Use   Smoking status: Former    Current packs/day: 0.00    Average packs/day: 2.5 packs/day for 20.0 years (50.0 ttl pk-yrs)    Types: Cigarettes    Start date: 06/23/1966    Quit date: 06/23/1986    Years since quitting: 37.6    Passive exposure: Past   Smokeless tobacco: Never  Vaping Use   Vaping status: Never Used  Substance and Sexual Activity   Alcohol use: No   Drug use: No   Sexual activity: Yes    Birth control/protection: Post-menopausal  Other Topics Concern   Not on file  Social History Narrative   Full Time, works at [employer].    Social Drivers of Corporate Investment Banker Strain: Not on file  Food Insecurity: Not on file  Transportation Needs: Not on file  Physical Activity: Not on file  Stress: Not on file  Social Connections: Not on file     Review of Systems: A 12 point ROS discussed and pertinent positives are indicated in the HPI above.  All other systems are negative.   Vital Signs: BP 119/82   Pulse 89   Temp 98.5 F (36.9 C) (Oral)   Resp 17   SpO2 93%   Advance Care Plan: No documents on file  Physical Exam Vitals and nursing note reviewed.  Constitutional:      Appearance: Normal appearance.  HENT:     Mouth/Throat:     Mouth: Mucous membranes are moist.     Pharynx: Oropharynx is clear.   Cardiovascular:     Rate and Rhythm: Normal rate and regular rhythm.  Pulmonary:     Effort: Pulmonary effort is normal.     Breath sounds: Normal breath sounds.  Abdominal:     Palpations: Abdomen is soft.     Tenderness: There is no abdominal tenderness.  Musculoskeletal:     Right lower leg: No edema.     Left lower leg: No edema.  Skin:    General: Skin is warm and dry.  Neurological:     Mental Status: She is alert and  oriented to person, place, and time. Mental status is at baseline.     Imaging: No results found.  Labs:  CBC: Recent Labs    04/23/23 1516 12/03/23 0920 01/23/24 1205  WBC 4.4 4.6 4.4  HGB 12.2 12.3 13.2  HCT 37.5 38.8 40.4  PLT 234 229 206    COAGS: Recent Labs    12/03/23 0920 01/23/24 1205  INR 1.0 0.9    BMP: Recent Labs    04/23/23 1516 12/03/23 0920  NA 138 142  K 3.8 4.0  CL 103 103  CO2 25 30  GLUCOSE 88 90  BUN 16 14  CALCIUM 9.2 9.2  CREATININE 0.96 0.89  GFRNONAA >60  --     LIVER FUNCTION TESTS: Recent Labs    04/23/23 1516 12/03/23 0920  BILITOT 0.8 0.7  AST 15 13  ALT 14 8  ALKPHOS 91  --   PROT 6.8 6.2  ALBUMIN 3.7  --     TUMOR MARKERS: Recent Labs    12/03/23 0920  AFPTM 6.1*    Assessment and Plan:  LATRENA BENEGAS is a 78 y.o. female with pmhx of arthritis, asthma, COPD, dyslipidemia, GERD, PBC, seizures, Sleep apnea. He has a long hx of primary biliary cholangitis with pruritus, followed by GI for this and has been treated with Ursodiol . Pt last seen by GI 12/03/23 and reviewed RUQ US  from 05/18/23 that showed cirrhotic morphology along with ELF testing done 11/18/23 with score of 10.22 - GI unsure if advanced fibrosis vs early cirrhosis. IR has now been consulted for image guided random liver biopsy for further evaluation.  Today patient without complaint. Has been NPO since midnight. Denies anticoagulation use.  Risks and benefits of liver biopsy was discussed with the patient and/or patient's  family including, but not limited to bleeding, infection, damage to adjacent structures or low yield requiring additional tests.  All of the questions were answered and there is agreement to proceed.  Consent signed and in chart.   Thank you for allowing our service to participate in SAPPHIRA HARJO 's care.  Electronically Signed: Kimble VEAR Clas, PA-C   01/23/2024, 12:55 PM      I spent a total of  15 Minutes   in face to face in clinical consultation, greater than 50% of which was counseling/coordinating care for liver biopsy

## 2024-01-24 ENCOUNTER — Observation Stay (HOSPITAL_COMMUNITY)

## 2024-01-24 DIAGNOSIS — I4891 Unspecified atrial fibrillation: Secondary | ICD-10-CM | POA: Diagnosis not present

## 2024-01-24 DIAGNOSIS — Z9189 Other specified personal risk factors, not elsewhere classified: Secondary | ICD-10-CM | POA: Diagnosis not present

## 2024-01-24 LAB — BASIC METABOLIC PANEL WITH GFR
Anion gap: 7 (ref 5–15)
BUN: 16 mg/dL (ref 8–23)
CO2: 29 mmol/L (ref 22–32)
Calcium: 9.1 mg/dL (ref 8.9–10.3)
Chloride: 104 mmol/L (ref 98–111)
Creatinine, Ser: 1.13 mg/dL — ABNORMAL HIGH (ref 0.44–1.00)
GFR, Estimated: 50 mL/min — ABNORMAL LOW (ref >60)
Glucose, Bld: 89 mg/dL (ref 70–99)
Potassium: 4.1 mmol/L (ref 3.5–5.1)
Sodium: 140 mmol/L (ref 135–145)

## 2024-01-24 LAB — CBC
HCT: 38.3 % (ref 36.0–46.0)
Hemoglobin: 11.9 g/dL — ABNORMAL LOW (ref 12.0–15.0)
MCH: 29.2 pg (ref 26.0–34.0)
MCHC: 31.1 g/dL (ref 30.0–36.0)
MCV: 94.1 fL (ref 80.0–100.0)
Platelets: 190 K/uL (ref 150–400)
RBC: 4.07 MIL/uL (ref 3.87–5.11)
RDW: 13.3 % (ref 11.5–15.5)
WBC: 4.2 K/uL (ref 4.0–10.5)
nRBC: 0 % (ref 0.0–0.2)

## 2024-01-24 LAB — ECHOCARDIOGRAM COMPLETE
Height: 65 in
S' Lateral: 3.1 cm
Weight: 3774.28 [oz_av]

## 2024-01-24 LAB — SURGICAL PATHOLOGY

## 2024-01-24 MED ORDER — PANTOPRAZOLE SODIUM 40 MG PO TBEC
40.0000 mg | DELAYED_RELEASE_TABLET | Freq: Two times a day (BID) | ORAL | Status: DC | PRN
Start: 2024-01-24 — End: 2024-01-24
  Administered 2024-01-24: 40 mg via ORAL
  Filled 2024-01-24: qty 1

## 2024-01-24 MED ORDER — OXYCODONE HCL 5 MG PO TABS: 5.0000 mg | ORAL_TABLET | ORAL | 0 refills | Status: AC | PRN

## 2024-01-24 NOTE — Progress Notes (Signed)
   01/24/24 0942  TOC Brief Assessment  Insurance and Status Reviewed  Patient has primary care physician Yes  Home environment has been reviewed single family home  Prior level of function: independent  Prior/Current Home Services No current home services  Social Drivers of Health Review SDOH reviewed no interventions necessary  Readmission risk has been reviewed Yes  Transition of care needs transition of care needs identified, TOC will continue to follow    Signed: Heather Saltness, MSW, LCSW Clinical Social Worker Inpatient Care Management 01/24/2024 9:43 AM

## 2024-01-24 NOTE — Progress Notes (Signed)
 I attest to student documentation.  Brek Reece V. Tashaun Obey, MSN-RN Nursing Faculty/Clinical Instructor [facility] Associate Degree Nursing Program

## 2024-01-24 NOTE — Discharge Summary (Signed)
 Physician Discharge Summary   Patient: Isabella Holmes MRN: [id number] DOB: 1945-10-07  Admit date:     01/23/2024  Discharge date: 01/24/24  Discharge Physician: Lonni SHAUNNA Dalton   PCP: Lari Elspeth BRAVO, MD     Recommendations at discharge:  Follow up with PCP Dr. Lari in1  week Dr. Lari: Please check CBC within 1 week (discharge Hgb 11.9 g/dL)  Follow up with Cardiology Rollo Louder in 2 weeks for new Afib Rollo Louder: Cleared to initiate anticoagulation for Afib stroke risk reduction at any time     Discharge Diagnoses: Principal Problem:   Post-biopsy liver hemorrhage, mild Other hospital problems   New onset atrial fibrillation   Class 2 obesity   Primary biliary cirrhosis   Hypertension   Hypothyroidism      Hospital Course: 78 y.o. F with PBC, obesity, hypothyroidism, and HTN who presented for routine liver biopsy.  Post procedure, she had persistent right upper quadrant pain, so CT was obtained that showed small amount of extravasation and peritoneal hemorrhage.  She was admitted for observation overnight.  Incidentally she was noted to have atrial fibrillation, which was new onset.    Postprocedural hemorrhage This was mild, reviewed with radiology this morning.  She has mild symptoms only, hemoglobin minimal dropped to 11.9, hemodynamically normal.    New onset atrial fibrillation No prior history.  CHA2DS2-VASc 4 for age, gender and HTN.  TSH normal.  Echo showed biatrial enlargement, normal EF, normal valves.  She is rate controlled without nodal blocking agents.  Given the minor amount of bleeding, radiology and I felt that she was safe to begin anticoagulation at any time.  This was discussed with the patient and daughter, and in shared decision making, we have decided to hold for now.  Follow-up with cardiology is arranged for 10 days from now.  Recommend PCP check CBC in 1 week, and if stable, cleared to begin anticoagulation  at any time.            The [place]  Controlled Substances Registry was reviewed for this patient prior to discharge.   Consultants: None Procedures performed: Biopsy of liver Echocardiogram    Disposition: Home Diet recommendation:  Discharge Diet Orders (From admission, onward)     Start     Ordered   01/24/24 0000  Diet - low sodium heart healthy        01/24/24 1434             DISCHARGE MEDICATION: Allergies as of 01/24/2024       Reactions   Penicillins Itching, Rash   Has patient had a PCN reaction causing immediate rash, facial/tongue/throat swelling, SOB or lightheadedness with hypotension: No Has patient had a PCN reaction causing severe rash involving mucus membranes or skin necrosis: No Has patient had a PCN reaction that required hospitalization No Has patient had a PCN reaction occurring within the last 10 years: No If all of the above answers are NO, then may proceed with Cephalosporin use.   Atorvastatin Other (See Comments)   Made my legs hurt.   Levofloxacin Other (See Comments)   Recommended by Dr due to family hx   Streptomycin Rash   Sulfa Antibiotics Itching, Rash        Medication List     PAUSE taking these medications    amLODipine 10 MG tablet Wait to take this until your doctor or other care provider tells you to start again. Commonly known as: NORVASC Take 10 mg  by mouth in the morning.       TAKE these medications    albuterol  108 (90 Base) MCG/ACT inhaler Commonly known as: VENTOLIN  HFA Inhale 1-2 puffs into the lungs every 4 (four) hours as needed for wheezing or shortness of breath.   albuterol  (2.5 MG/3ML) 0.083% nebulizer solution Commonly known as: PROVENTIL  Take 2.5 mg by nebulization every 6 (six) hours as needed for shortness of breath.   famotidine 20 MG tablet Commonly known as: PEPCID Take 20 mg by mouth in the morning and at bedtime.   FLUoxetine 20 MG capsule Commonly known as:  PROZAC Take 20 mg by mouth daily.   lamoTRIgine  150 MG tablet Commonly known as: LAMICTAL  Take 150 mg by mouth at bedtime.   levothyroxine  50 MCG tablet Commonly known as: SYNTHROID  Take 50 mcg by mouth daily before breakfast.   losartan 100 MG tablet Commonly known as: COZAAR Take 100 mg by mouth in the morning.   oxyCODONE  5 MG immediate release tablet Commonly known as: Oxy IR/ROXICODONE  Take 1 tablet (5 mg total) by mouth every 4 (four) hours as needed for moderate pain (pain score 4-6).   ursodiol  500 MG tablet Commonly known as: ACTIGALL  TAKE ONE TABLET BY MOUTH 3 TIMES DAILY WITH FOOD What changed: See the new instructions.   Vitamin D3 1000 units Caps Take 1,000 Units by mouth in the morning.        Follow-up Information     Burdine, Elspeth BRAVO, MD. Schedule an appointment as soon as possible for a visit in 1 week(s).   Specialty: Family Medicine Contact information: [address] [place] [address] [phone]         Vicci Rollo SAUNDERS, PA-C Follow up.   Specialty: Cardiology Contact information: [address] [place] [address] [phone]                 Discharge Instructions     Diet - low sodium heart healthy   Complete by: As directed    Discharge instructions   Complete by: As directed    **IMPORTANT DISCHARGE INSTRUCTIONS**   From Dr. Jonel: You were admitted for observation after your biopsy due to new atrial fibrillation and also hematoma.  The hematoma is small. Please call Dr. Deniece office, go to your PCP and get a blood check in 1 week (have him check a complete blood count)  Keep the 3 days lifting restrictions recommended by Interventional Radiology Follow up with Dr. Eartha for biopsy results and interpretation  For the new abnormal heart rhythm, atrial fibrillation --> go see the Cardiology office Rollo Vicci on 11/25  When you see them, you may begin a blood thinner (this is recommended  in the long term to reduce risk of stroke).   Increase activity slowly   Complete by: As directed        Discharge Exam: Filed Weights   01/23/24 1652 01/23/24 2030  Weight: 104.3 kg 107 kg    General: Pt is alert, awake, not in acute distress Cardiovascular: RRR, nl S1-S2, no murmurs appreciated.   No LE edema.   Respiratory: Normal respiratory rate and rhythm.  CTAB without rales or wheezes. Abdominal: Abdomen soft and mild RUQ pain without guarding.  No distension or HSM.   Neuro/Psych: Strength symmetric in upper and lower extremities.  Judgment and insight appear normal.   Condition at discharge: good  The results of significant diagnostics from this hospitalization (including imaging, microbiology, ancillary and laboratory) are listed  below for reference.   Imaging Studies: ECHOCARDIOGRAM COMPLETE Result Date: 01/24/2024    ECHOCARDIOGRAM REPORT   Patient Name:   TIONNA GIGANTE Date of Exam: 01/24/2024 Medical Rec #:  [id number]       Height:       65.0 in Accession #:    [id number]      Weight:       235.9 lb Date of Birth:  [birth year]       BSA:          2.122 m Patient Age:    78 years        BP:           127/81 mmHg Patient Gender: F               HR:           67 bpm. Exam Location:  Inpatient Procedure: 2D Echo, Color Doppler and Cardiac Doppler (Both Spectral and Color            Flow Doppler were utilized during procedure). Indications:    Afib I48.91  History:        Patient has no prior history of Echocardiogram examinations.                 COPD; Risk Factors:Dyslipidemia.  Sonographer:    Tinnie Gosling RDCS Referring Phys: [id number] [facility] IMPRESSIONS  1. Left ventricular ejection fraction, by estimation, is 60 to 65%. The left ventricle has normal function. The left ventricle has no regional wall motion abnormalities. There is mild concentric left ventricular hypertrophy. Left ventricular diastolic parameters are indeterminate.  2. Right ventricular systolic  function is normal. The right ventricular size is normal.  3. Left atrial size was severely dilated.  4. The mitral valve is normal in structure. Mild mitral valve regurgitation. No evidence of mitral stenosis.  5. The aortic valve is normal in structure. Aortic valve regurgitation is not visualized. Aortic valve sclerosis/calcification is present, without any evidence of aortic stenosis.  6. The inferior vena cava is normal in size with greater than 50% respiratory variability, suggesting right atrial pressure of 3 mmHg. FINDINGS  Left Ventricle: Left ventricular ejection fraction, by estimation, is 60 to 65%. The left ventricle has normal function. The left ventricle has no regional wall motion abnormalities. The left ventricular internal cavity size was normal in size. There is  mild concentric left ventricular hypertrophy. Left ventricular diastolic parameters are indeterminate. Right Ventricle: The right ventricular size is normal. No increase in right ventricular wall thickness. Right ventricular systolic function is normal. Left Atrium: Left atrial size was severely dilated. Right Atrium: Right atrial size was normal in size. Pericardium: There is no evidence of pericardial effusion. Presence of epicardial fat layer. Mitral Valve: The mitral valve is normal in structure. Mild mitral valve regurgitation. No evidence of mitral valve stenosis. Tricuspid Valve: The tricuspid valve is normal in structure. Tricuspid valve regurgitation is mild . No evidence of tricuspid stenosis. Aortic Valve: The aortic valve is normal in structure. Aortic valve regurgitation is not visualized. Aortic valve sclerosis/calcification is present, without any evidence of aortic stenosis. Pulmonic Valve: The pulmonic valve was normal in structure. Pulmonic valve regurgitation is not visualized. No evidence of pulmonic stenosis. Aorta: The aortic root is normal in size and structure. Venous: The inferior vena cava is normal in size with  greater than 50% respiratory variability, suggesting right atrial pressure of 3 mmHg. IAS/Shunts: No atrial level shunt detected by color  flow Doppler.  LEFT VENTRICLE PLAX 2D LVIDd:         4.20 cm   Diastology LVIDs:         3.10 cm   LV e' medial: 7.94 cm/s LV PW:         1.00 cm LV IVS:        1.00 cm LVOT diam:     2.10 cm LV SV:         53 LV SV Index:   25 LVOT Area:     3.46 cm LV IVRT:       103 msec  RIGHT VENTRICLE             IVC RV S prime:     12.30 cm/s  IVC diam: 2.80 cm TAPSE (M-mode): 1.8 cm LEFT ATRIUM            Index        RIGHT ATRIUM           Index LA diam:      4.90 cm  2.31 cm/m   RA Area:     16.90 cm LA Vol (A4C): 114.0 ml 53.73 ml/m  RA Volume:   41.50 ml  19.56 ml/m  AORTIC VALVE LVOT Vmax:   85.80 cm/s LVOT Vmean:  55.100 cm/s LVOT VTI:    0.154 m  AORTA Ao Root diam: 3.40 cm Ao Asc diam:  3.40 cm TRICUSPID VALVE TR Peak grad:   23.4 mmHg TR Vmax:        242.00 cm/s  SHUNTS Systemic VTI:  0.15 m Systemic Diam: 2.10 cm Kardie Tobb DO Electronically signed by Dub Huntsman DO Signature Date/Time: 01/24/2024/1:37:19 PM    Final    DG Chest Portable 1 View Result Date: 01/23/2024 CLINICAL DATA:  Liver biopsy with abdominal pain EXAM: PORTABLE CHEST 1 VIEW COMPARISON:  CT 01/23/2024 FINDINGS: Stable cardiomediastinal silhouette with aortic atherosclerosis. Low lung volumes. No pleural effusion or pneumothorax. Limited assessment for free air on supine view. None seen on subsequently performed CT angiography. IMPRESSION: Low lung volumes. No active disease. Electronically Signed   By: Luke Bun M.D.   On: 01/23/2024 18:13   CT Angio Abd/Pel W and/or Wo Contrast Result Date: 01/23/2024 CLINICAL DATA:  Liver biopsy this afternoon, severe abdominal pain EXAM: CTA ABDOMEN AND PELVIS WITHOUT AND WITH CONTRAST TECHNIQUE: Multidetector CT imaging of the abdomen and pelvis was performed using the standard protocol during bolus administration of intravenous contrast. Multiplanar  reconstructed images and MIPs were obtained and reviewed to evaluate the vascular anatomy. RADIATION DOSE REDUCTION: This exam was performed according to the departmental dose-optimization program which includes automated exposure control, adjustment of the mA and/or kV according to patient size and/or use of iterative reconstruction technique. CONTRAST:  100mL OMNIPAQUE IOHEXOL 350 MG/ML SOLN COMPARISON:  None Available. FINDINGS: VASCULAR Normal contour and caliber of the abdominal aorta. No evidence of aneurysm, dissection, or other acute aortic pathology. Standard branching pattern of the abdominal aorta with solitary bilateral renal arteries. Moderate aortic atherosclerosis. Review of the MIP images confirms the above findings. NON-VASCULAR Lower Chest: No acute findings. Hepatobiliary: No solid liver abnormality is seen. Coarse, nodular cirrhotic morphology of the liver. No parenchymal hematoma or arterial extravasation. Small gallstone. No gallbladder wall thickening, or biliary dilatation. Pancreas: Unremarkable. No pancreatic ductal dilatation or surrounding inflammatory changes. Spleen: Normal in size without significant abnormality. Adrenals/Urinary Tract: Adrenal glands are unremarkable. Kidneys are normal, without renal calculi, solid lesion, or hydronephrosis. Bladder is  unremarkable. Stomach/Bowel: Stomach is within normal limits. Appendix appears normal. No evidence of bowel wall thickening, distention, or inflammatory changes. Sigmoid diverticulosis. Lymphatic: Enlarged portacaval lymph nodes measuring up 2.4 x 1.3 cm Reproductive: No mass or other significant abnormality. Other: No abdominal wall hernia. Superficial soft tissue stranding in the right upper quadrant in keeping with percutaneous liver biopsy (series 4, image 90). Trace intermediate attenuation perihepatic ascites measuring up to 1.3 cm in thickness (series 4, image 49). No evidence of arterial extravasation. Musculoskeletal: No acute  osseous findings. IMPRESSION: 1. Superficial soft tissue stranding in the right upper quadrant in keeping with percutaneous liver biopsy. 2. Trace intermediate attenuation perihepatic ascites measuring up to 1.3 cm in thickness, consistent with small volume hemoperitoneum. No evidence of contrast extravasation within the peritoneum or within the liver parenchyma. Consider short interval follow-up to ensure stability. 3. Cirrhosis. 4. Cholelithiasis. 5. Enlarged portacaval lymph nodes, likely reactive in the setting of cirrhosis. Aortic Atherosclerosis (ICD10-I70.0). Electronically Signed   By: Marolyn JONETTA Jaksch M.D.   On: 01/23/2024 18:00   US  Abdomen Limited Result Date: 01/23/2024 CLINICAL DATA:  Acute epigastric abdominal pain following right inferior peripheral liver biopsy EXAM: ULTRASOUND ABDOMEN LIMITED COMPARISON:  01/23/2024 FINDINGS: Ultrasound performed of the liver and survey of the abdomen performed to assess for free fluid. Stable heterogeneous appearance of the liver. No surrounding perihepatic free fluid, fluid collection, or subcapsular collection. Biopsy site in the right inferior liver is unremarkable. Survey of the abdominal 4 quadrants demonstrates no free fluid. IMPRESSION: No acute or significant finding by limited abdominal ultrasound Electronically Signed   By: CHRISTELLA.  Shick M.D.   On: 01/23/2024 17:18   US  BIOPSY (LIVER) Result Date: 01/23/2024 INDICATION: Cirrhosis, assess for primary biliary cholangitis EXAM: ULTRASOUND RIGHT LIVER RANDOM PERIPHERAL CORE BIOPSY MEDICATIONS: 1% LIDOCAINE  LOCAL ANESTHESIA/SEDATION: Moderate (conscious) sedation was employed during this procedure. A total of Versed  1.0 mg and Fentanyl  25 mcg was administered intravenously by the radiology nurse. Total intra-service moderate Sedation Time: 7 minutes. The patient's level of consciousness and vital signs were monitored continuously by radiology nursing throughout the procedure under my direct supervision.  COMPLICATIONS: None immediate. PROCEDURE: Informed written consent was obtained from the patient after a thorough discussion of the procedural risks, benefits and alternatives. All questions were addressed. Maximal Sterile Barrier Technique was utilized including caps, mask, sterile gowns, sterile gloves, sterile drape, hand hygiene and skin antiseptic. A timeout was performed prior to the initiation of the procedure. Preliminary ultrasound performed. Inferior right liver was localized below the right subcostal margin. Overlying skin marked in the mid axillary line through a lower intercostal space. Under sterile conditions and local anesthesia, the 17 gauge guide needle was advanced into the periphery of the right inferior liver. Needle position confirmed with ultrasound. Images obtained for documentation. 2 18 gauge core biopsies obtained through the access. These were intact and non fragmented. Samples placed in formalin. Needle tract occluded with Gel-Foam. Postprocedure imaging demonstrates no hemorrhage or hematoma. Patient tolerated the biopsy well. IMPRESSION: Successful ultrasound right inferior liver random core biopsy peripherally Electronically Signed   By: CHRISTELLA.  Shick M.D.   On: 01/23/2024 14:29    Microbiology: Results for orders placed or performed during the hospital encounter of 03/08/21  Resp Panel by RT-PCR (Flu A&B, Covid) Nasopharyngeal Swab     Status: None   Collection Time: 03/08/21  1:45 PM   Specimen: Nasopharyngeal Swab; Nasopharyngeal(NP) swabs  in vial transport medium  Result Value Ref Range Status   SARS Coronavirus  2 by RT PCR NEGATIVE NEGATIVE Final    Comment: (NOTE) SARS-CoV-2 target nucleic acids are NOT DETECTED.  The SARS-CoV-2 RNA is generally detectable in upper respiratory specimens during the acute phase of infection. The lowest concentration of SARS-CoV-2 viral copies this assay can detect is 138 copies/mL. A negative result does not preclude  SARS-Cov-2 infection and should not be used as the sole basis for treatment or other patient management decisions. A negative result may occur with  improper specimen collection/handling, submission of specimen other than nasopharyngeal swab, presence of viral mutation(s) within the areas targeted by this assay, and inadequate number of viral copies(<138 copies/mL). A negative result must be combined with clinical observations, patient history, and epidemiological information. The expected result is Negative.  Fact Sheet for Patients:  bloggercourse.com  Fact Sheet for Healthcare Providers:  seriousbroker.it  This test is no t yet approved or cleared by the [place]  FDA and  has been authorized for detection and/or diagnosis of SARS-CoV-2 by FDA under an Emergency Use Authorization (EUA). This EUA will remain  in effect (meaning this test can be used) for the duration of the COVID-19 declaration under Section 564(b)(1) of the Act, 21 U.S.C.section 360bbb-3(b)(1), unless the authorization is terminated  or revoked sooner.       Influenza A by PCR NEGATIVE NEGATIVE Final   Influenza B by PCR NEGATIVE NEGATIVE Final    Comment: (NOTE) The Xpert Xpress SARS-CoV-2/FLU/RSV plus assay is intended as an aid in the diagnosis of influenza from Nasopharyngeal swab specimens and should not be used as a sole basis for treatment. Nasal washings and aspirates are unacceptable for Xpert Xpress SARS-CoV-2/FLU/RSV testing.  Fact Sheet for Patients: bloggercourse.com  Fact Sheet for Healthcare Providers: seriousbroker.it  This test is not yet approved or cleared by the [place]  FDA and has been authorized for detection and/or diagnosis of SARS-CoV-2 by FDA under an Emergency Use Authorization (EUA). This EUA will remain in effect (meaning this test can be used) for the duration of  the COVID-19 declaration under Section 564(b)(1) of the Act, 21 U.S.C. section 360bbb-3(b)(1), unless the authorization is terminated or revoked.  Performed at [facility], [address] [place], [place] [address]     Labs: CBC: Recent Labs  Lab 01/23/24 1205 01/23/24 1646 01/23/24 1700 01/24/24 0627  WBC 4.4 4.4  --  4.2  NEUTROABS  --  2.1  --   --   HGB 13.2 13.6 14.3 11.9*  HCT 40.4 43.3 42.0 38.3  MCV 92.0 92.9  --  94.1  PLT 206 194  --  190   Basic Metabolic Panel: Recent Labs  Lab 01/23/24 1646 01/23/24 1700 01/24/24 0627  NA 140 138 140  K 4.7 4.2 4.1  CL 104 107 104  CO2 27  --  29  GLUCOSE 97 100* 89  BUN 14 17 16   CREATININE 1.08* 1.10* 1.13*  CALCIUM 9.9  --  9.1   Liver Function Tests: Recent Labs  Lab 01/23/24 1646  AST 25  ALT 14  ALKPHOS 105  BILITOT 0.9  PROT 6.9  ALBUMIN 4.3   CBG: No results for input(s): GLUCAP in the last 168 hours.  Discharge time spent: approximately 45 minutes spent on discharge counseling, evaluation of patient on day of discharge, and coordination of discharge planning with nursing, social work, pharmacy and case management  Signed: Lonni SHAUNNA Dalton, MD [facility] 01/24/2024

## 2024-01-24 NOTE — Progress Notes (Signed)
  Echocardiogram 2D Echocardiogram has been performed.  Tinnie FORBES Gosling RDCS 01/24/2024, 10:56 AM

## 2024-01-24 NOTE — Progress Notes (Signed)
 Chaplain responded to spiritual care consult for advance directives. Daughter Tammy bedside. Paperwork and information provided and all questions answered at this time. Pt Isabella Holmes wishes for her daughter Madelin to serve as primary HCPOA and son Krystal to serve as secondary.   Chaplains remain available as needed.

## 2024-01-28 ENCOUNTER — Ambulatory Visit (INDEPENDENT_AMBULATORY_CARE_PROVIDER_SITE_OTHER): Payer: Self-pay | Admitting: Gastroenterology

## 2024-01-29 ENCOUNTER — Telehealth (INDEPENDENT_AMBULATORY_CARE_PROVIDER_SITE_OTHER): Payer: Self-pay | Admitting: Gastroenterology

## 2024-01-29 NOTE — Telephone Encounter (Signed)
 Can you schedule this pt with Dr.C? Thank you!!

## 2024-01-29 NOTE — Telephone Encounter (Signed)
 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  Also had questions about the staging.  Daughter stated that she does not understand the results very much and would like to come in and discuss it with provider. Advised pt I could transfer up front to get an appt scheduled.   Please advise. Thank you!

## 2024-01-30 ENCOUNTER — Other Ambulatory Visit (INDEPENDENT_AMBULATORY_CARE_PROVIDER_SITE_OTHER): Payer: Self-pay | Admitting: Gastroenterology

## 2024-01-30 NOTE — Progress Notes (Signed)
 Cardiology Office Note   Date:  02/05/2024  ID:  Isabella Holmes 1946-01-13, MRN [id number] PCP: Isabella Elspeth BRAVO, MD  [facility] Providers Cardiologist:  Dr. Delford   History of Present Illness Isabella Holmes is a 78 y.o. female with a past medical history of primary biliary cirrhosis, hypothyroidism, GERD, HTN, recent small peritoneal hemorrhage after ultrasound-guided liver biopsy, COPD, OSA. Patient presents today for a hospital follow up   Patient was seen by Dr. Delford in 2022 for evaluation of dyspnea.  Underwent stress test on 03/25/2021 that was a low risk study, no evidence of ischemia.  EF 71%.  Patient has not been followed by cardiology since that time  Patient recently admitted from 11/12-11/13/25. She had undergone a routine liver biopsy.  Postprocedure she had persistent right upper quadrant pain.  CT showed small amount of extravasation and peritoneal hemorrhage.  She was admitted for observation overnight.  While admitted, she was found to be in atrial fibrillation.  TSH was normal.  She was rate controlled without nodal blocking agents.  Her CHA2DS2-VASc was 4.  Radiology and internal medicine agreed that it was safe to begin anticoagulation given a minor amount of bleeding.  Patient and daughter preferred to hold off for now.  Echocardiogram 01/24/2024 showed EF 60-65%, mild LVH, normal RV systolic function, severe left atrial dilation, mild MR.  Today, patient presents for hospital appointment.  She initially had some issues with back pain and palpitations when she was discharged from the hospital.  The symptoms have somewhat resolved since she was discharged.  Her back pain is almost gone.  She continues to have occasional palpitations.  When she has a palpitation, she starts to feel short of breath.  Denies chest pain.  Denies dyspnea on exertion and orthopnea.  She has started her Eliquis. This was started by her primary care provider last week.  Denies  bleeding or bruising on Eliquis.  Her primary care provider checked a CBC last week.  She was told that her hemoglobin had improved up to the 12 range.  We reviewed atrial fibrillation.  Discussed triggers including sleep apnea, caffeine, alcohol,, infection.  Discussed plan for cardioversion.  Patient is in agreement.   Studies Reviewed EKG Interpretation Date/Time:  Tuesday February 05 2024 14:49:59 EST Ventricular Rate:  95 PR Interval:    QRS Duration:  82 QT Interval:  322 QTC Calculation: 404 R Axis:   -22  Text Interpretation: Atrial fibrillation Cannot rule out Anterior infarct , age undetermined When compared with ECG of 23-Jan-2024 16:47, PREVIOUS ECG IS PRESENT Confirmed by Vicci Sauer [phone]) on 02/05/2024 4:01:52 PM   Cardiac Studies & Procedures   ______________________________________________________________________________________________   STRESS TESTS  NM MYOCAR MULTI W/SPECT W 03/25/2021  Narrative   Findings are consistent with no ischemia. The study is low risk.   No ST deviation was noted. The ECG was negative for ischemia.   LV perfusion is normal.  There is evidence of breast attenuation affecting the mid to basal inferolateral wall, no definitive ischemia.   Left ventricular function is normal. Nuclear stress EF: 71 %.  Low risk study with breast attenuation artifact and LVEF 71%.   ECHOCARDIOGRAM  ECHOCARDIOGRAM COMPLETE 01/24/2024  Narrative ECHOCARDIOGRAM REPORT    Patient Name:   Isabella Holmes Date of Exam: 01/24/2024 Medical Rec #:  [id number]       Height:       65.0 in Accession #:    [id number]  Weight:       235.9 lb Date of Birth:  Aug 08, 1945       BSA:          2.122 m Patient Age:    41 years        BP:           127/81 mmHg Patient Gender: F               HR:           67 bpm. Exam Location:  Inpatient  Procedure: 2D Echo, Color Doppler and Cardiac Doppler (Both Spectral and Color Flow Doppler were utilized during  procedure).  Indications:    Afib I48.91  History:        Patient has no prior history of Echocardiogram examinations. COPD; Risk Factors:Dyslipidemia.  Sonographer:    Tinnie Gosling RDCS Referring Phys: [id number] MIR [facility]  IMPRESSIONS   1. Left ventricular ejection fraction, by estimation, is 60 to 65%. The left ventricle has normal function. The left ventricle has no regional wall motion abnormalities. There is mild concentric left ventricular hypertrophy. Left ventricular diastolic parameters are indeterminate. 2. Right ventricular systolic function is normal. The right ventricular size is normal. 3. Left atrial size was severely dilated. 4. The mitral valve is normal in structure. Mild mitral valve regurgitation. No evidence of mitral stenosis. 5. The aortic valve is normal in structure. Aortic valve regurgitation is not visualized. Aortic valve sclerosis/calcification is present, without any evidence of aortic stenosis. 6. The inferior vena cava is normal in size with greater than 50% respiratory variability, suggesting right atrial pressure of 3 mmHg.  FINDINGS Left Ventricle: Left ventricular ejection fraction, by estimation, is 60 to 65%. The left ventricle has normal function. The left ventricle has no regional wall motion abnormalities. The left ventricular internal cavity size was normal in size. There is mild concentric left ventricular hypertrophy. Left ventricular diastolic parameters are indeterminate.  Right Ventricle: The right ventricular size is normal. No increase in right ventricular wall thickness. Right ventricular systolic function is normal.  Left Atrium: Left atrial size was severely dilated.  Right Atrium: Right atrial size was normal in size.  Pericardium: There is no evidence of pericardial effusion. Presence of epicardial fat layer.  Mitral Valve: The mitral valve is normal in structure. Mild mitral valve regurgitation. No evidence of mitral valve  stenosis.  Tricuspid Valve: The tricuspid valve is normal in structure. Tricuspid valve regurgitation is mild . No evidence of tricuspid stenosis.  Aortic Valve: The aortic valve is normal in structure. Aortic valve regurgitation is not visualized. Aortic valve sclerosis/calcification is present, without any evidence of aortic stenosis.  Pulmonic Valve: The pulmonic valve was normal in structure. Pulmonic valve regurgitation is not visualized. No evidence of pulmonic stenosis.  Aorta: The aortic root is normal in size and structure.  Venous: The inferior vena cava is normal in size with greater than 50% respiratory variability, suggesting right atrial pressure of 3 mmHg.  IAS/Shunts: No atrial level shunt detected by color flow Doppler.   LEFT VENTRICLE PLAX 2D LVIDd:         4.20 cm   Diastology LVIDs:         3.10 cm   LV e' medial: 7.94 cm/s LV PW:         1.00 cm LV IVS:        1.00 cm LVOT diam:     2.10 cm LV SV:  53 LV SV Index:   25 LVOT Area:     3.46 cm LV IVRT:       103 msec   RIGHT VENTRICLE             IVC RV S prime:     12.30 cm/s  IVC diam: 2.80 cm TAPSE (M-mode): 1.8 cm  LEFT ATRIUM            Index        RIGHT ATRIUM           Index LA diam:      4.90 cm  2.31 cm/m   RA Area:     16.90 cm LA Vol (A4C): 114.0 ml 53.73 ml/m  RA Volume:   41.50 ml  19.56 ml/m AORTIC VALVE LVOT Vmax:   85.80 cm/s LVOT Vmean:  55.100 cm/s LVOT VTI:    0.154 m  AORTA Ao Root diam: 3.40 cm Ao Asc diam:  3.40 cm  TRICUSPID VALVE TR Peak grad:   23.4 mmHg TR Vmax:        242.00 cm/s  SHUNTS Systemic VTI:  0.15 m Systemic Diam: 2.10 cm  Kardie Tobb DO Electronically signed by Dub Huntsman DO Signature Date/Time: 01/24/2024/1:37:19 PM    Final          ______________________________________________________________________________________________      Risk Assessment/Calculations  CHA2DS2-VASc Score = 4   This indicates a 4.8% annual risk of  stroke. The patient's score is based upon: CHF History: 0 HTN History: 1 Diabetes History: 0 Stroke History: 0 Vascular Disease History: 0 Age Score: 2 Gender Score: 1            Physical Exam VS:  BP 104/64   Pulse 93   Ht 5' 5 (1.651 m)   Wt 235 lb 12.8 oz (107 kg)   SpO2 94%   BMI 39.24 kg/m        Wt Readings from Last 3 Encounters:  02/05/24 235 lb 12.8 oz (107 kg)  01/23/24 235 lb 14.3 oz (107 kg)  12/03/23 240 lb 11.2 oz (109.2 kg)    GEN: Well nourished, well developed in no acute distress. Sitting comfortably on the exam table  NECK: No JVD  CARDIAC: Irregular rate and rhythm, no murmurs, rubs, gallops RESPIRATORY:  Clear to auscultation without rales, wheezing or rhonchi. Normal WOB on room air   ABDOMEN: Soft, non-tender, non-distended EXTREMITIES:  No edema in BLE; No deformity   ASSESSMENT AND PLAN  New onset atrial fibrillation  - Incidentally found when patient was admitted with small peritoneal hemorrhage after liver biopsy  - Echo that admission with EF 60-65%, mild LVH, normal RV function, severe left atrial dilation  - TSH within normal limits  - Patient remains in atrial fibrillation today. HR in the 90s. She has occasional palpitations and shortness of breath  - Stop amlodipine  - Start metoprolol  succinate 50 mg daily  - Continue eliquis 5 mg BID - this was started by her PCP last week. Her PCP had obtained a CBC prior to starting, reportedly hemoglobin had improved. She denies bleeding, bruising since starting  - Patient has a follow up with GI on 12/11 to discuss if any further workup needed. As long as no procedures are planned, recommend cardioversion in 3 weeks after she is adequately anticoagulated  - Arranged close follow up after GI appointment to arrange cardioversion.    HTN  - BP 104/64 today - As above, stop amlodipine - Start  metoprolol  succinate 50 mg daily  - Continue losartan  100 mg daily  - K 4.1, creatinine 1.13 on 01/24/24    OSA  - Patient not compliant with CPAP. Discussed relationship between OSA and atrial fibrillation   Postprocedural hemorrhage  - After liver biopsy on 11/12. Small  - Hemoglobin 11.9 on 11/13. CBC was repeated by PCP last week. Hemoglobin had reportedly improved  - Back pain has resolved    Dispo: Follow up in 3 weeks to discuss DCCV   Signed, Rollo FABIENE Louder, PA-C

## 2024-02-05 ENCOUNTER — Encounter: Payer: Self-pay | Admitting: Cardiology

## 2024-02-05 ENCOUNTER — Ambulatory Visit: Attending: Cardiology | Admitting: Cardiology

## 2024-02-05 VITALS — BP 104/64 | HR 93 | Ht 65.0 in | Wt 235.8 lb

## 2024-02-05 DIAGNOSIS — I10 Essential (primary) hypertension: Secondary | ICD-10-CM | POA: Diagnosis not present

## 2024-02-05 DIAGNOSIS — I4819 Other persistent atrial fibrillation: Secondary | ICD-10-CM | POA: Diagnosis not present

## 2024-02-05 DIAGNOSIS — G4733 Obstructive sleep apnea (adult) (pediatric): Secondary | ICD-10-CM | POA: Diagnosis not present

## 2024-02-05 MED ORDER — METOPROLOL SUCCINATE ER 50 MG PO TB24: 50.0000 mg | ORAL_TABLET | Freq: Every day | ORAL | 3 refills | Status: AC

## 2024-02-05 NOTE — Patient Instructions (Addendum)
 Thank you for choosing [facility]!     Medication Instructions:  Stop the Amlodipine 10mg .  Start Metoprolol  Succinate 50mg . Take one tablet daily. *If you need a refill on your cardiac medications before your next appointment, please call your pharmacy*   Lab Work: No labs were ordered during today's visit.  If you have labs (blood work) drawn today and your tests are completely normal, you will receive your results only by: MyChart Message (if you have MyChart) OR A paper copy in the mail If you have any lab test that is abnormal or we need to change your treatment, we will call you to review the results.   Testing/Procedures: No procedures were ordered during today's visit.   Your next appointment:   3 week(s) to discuss Cardioversion   Provider:   One of our Advanced Practice Providers (APPs): Morse Clause, PA-C  Lamarr Satterfield, NP Miriam Shams, NP  Olivia Pavy, PA-C Josefa Beauvais, NP  Leontine Salen, PA-C Orren Fabry, PA-C  Hao Meng, PA-C Ernest Dick, NP  Damien Braver, NP Jon Hails, PA-C  Waddell Donath, PA-C    Dayna Dunn, PA-C  Scott Weaver, PA-C Lum Louis, NP Katlyn West, NP Callie Goodrich, PA-C  Xika Zhao, NP Thom Sluder, PA-C    Kathleen Johnson, PA-C     Follow-Up: At [facility], you and your health needs are our priority.  As part of our continuing mission to provide you with exceptional heart care, we have created designated Provider Care Teams.  These Care Teams include your primary Cardiologist (physician) and Advanced Practice Providers (APPs -  Physician Assistants and Nurse Practitioners) who all work together to provide you with the care you need, when you need it. We recommend signing up for the patient portal called MyChart.  Sign up information is provided on this After Visit Summary.  MyChart is used to connect with patients for Virtual Visits (Telemedicine).  Patients are able to view lab/test results, encounter  notes, upcoming appointments, etc.  Non-urgent messages can be sent to your provider as well.   To learn more about what you can do with MyChart, go to [email or web address].

## 2024-02-06 ENCOUNTER — Other Ambulatory Visit (HOSPITAL_BASED_OUTPATIENT_CLINIC_OR_DEPARTMENT_OTHER): Payer: Self-pay

## 2024-02-13 ENCOUNTER — Emergency Department (HOSPITAL_COMMUNITY)

## 2024-02-13 ENCOUNTER — Encounter (HOSPITAL_COMMUNITY): Payer: Self-pay

## 2024-02-13 ENCOUNTER — Emergency Department (HOSPITAL_COMMUNITY)
Admission: EM | Admit: 2024-02-13 | Discharge: 2024-02-13 | Disposition: A | Discharge status: Home / Self Care | Attending: Emergency Medicine | Admitting: Emergency Medicine

## 2024-02-13 ENCOUNTER — Other Ambulatory Visit: Payer: Self-pay

## 2024-02-13 DIAGNOSIS — J441 Chronic obstructive pulmonary disease with (acute) exacerbation: Secondary | ICD-10-CM | POA: Diagnosis not present

## 2024-02-13 DIAGNOSIS — I4891 Unspecified atrial fibrillation: Secondary | ICD-10-CM | POA: Diagnosis not present

## 2024-02-13 DIAGNOSIS — Z7901 Long term (current) use of anticoagulants: Secondary | ICD-10-CM | POA: Diagnosis not present

## 2024-02-13 DIAGNOSIS — R0602 Shortness of breath: Secondary | ICD-10-CM | POA: Diagnosis not present

## 2024-02-13 DIAGNOSIS — I700 Atherosclerosis of aorta: Secondary | ICD-10-CM | POA: Diagnosis not present

## 2024-02-13 LAB — CBC WITH DIFFERENTIAL/PLATELET
Abs Immature Granulocytes: 0.01 K/uL (ref 0.00–0.07)
Basophils Absolute: 0 K/uL (ref 0.0–0.1)
Basophils Relative: 1 %
Eosinophils Absolute: 0.1 K/uL (ref 0.0–0.5)
Eosinophils Relative: 2 %
HCT: 40.6 % (ref 36.0–46.0)
Hemoglobin: 12.9 g/dL (ref 12.0–15.0)
Immature Granulocytes: 0 %
Lymphocytes Relative: 33 %
Lymphs Abs: 1.8 K/uL (ref 0.7–4.0)
MCH: 29.3 pg (ref 26.0–34.0)
MCHC: 31.8 g/dL (ref 30.0–36.0)
MCV: 92.1 fL (ref 80.0–100.0)
Monocytes Absolute: 0.5 K/uL (ref 0.1–1.0)
Monocytes Relative: 10 %
Neutro Abs: 3 K/uL (ref 1.7–7.7)
Neutrophils Relative %: 54 %
Platelets: 239 K/uL (ref 150–400)
RBC: 4.41 MIL/uL (ref 3.87–5.11)
RDW: 13 % (ref 11.5–15.5)
WBC: 5.4 K/uL (ref 4.0–10.5)
nRBC: 0 % (ref 0.0–0.2)

## 2024-02-13 LAB — COMPREHENSIVE METABOLIC PANEL WITH GFR
ALT: 10 U/L (ref 0–44)
AST: 18 U/L (ref 15–41)
Albumin: 4.2 g/dL (ref 3.5–5.0)
Alkaline Phosphatase: 111 U/L (ref 38–126)
Anion gap: 13 (ref 5–15)
BUN: 12 mg/dL (ref 8–23)
CO2: 25 mmol/L (ref 22–32)
Calcium: 9.1 mg/dL (ref 8.9–10.3)
Chloride: 102 mmol/L (ref 98–111)
Creatinine, Ser: 1.06 mg/dL — ABNORMAL HIGH (ref 0.44–1.00)
GFR, Estimated: 54 mL/min — ABNORMAL LOW (ref >60)
Glucose, Bld: 78 mg/dL (ref 70–99)
Potassium: 4 mmol/L (ref 3.5–5.1)
Sodium: 141 mmol/L (ref 135–145)
Total Bilirubin: 0.6 mg/dL (ref 0.0–1.2)
Total Protein: 6.6 g/dL (ref 6.5–8.1)

## 2024-02-13 LAB — TROPONIN T, HIGH SENSITIVITY
Troponin T High Sensitivity: <15 ng/L (ref 0–19)
Troponin T High Sensitivity: <15 ng/L (ref 0–19)

## 2024-02-13 LAB — RESP PANEL BY RT-PCR (RSV, FLU A&B, COVID)  RVPGX2
Influenza A by PCR: NEGATIVE
Influenza B by PCR: NEGATIVE
Resp Syncytial Virus by PCR: NEGATIVE
SARS Coronavirus 2 by RT PCR: NEGATIVE

## 2024-02-13 LAB — PRO BRAIN NATRIURETIC PEPTIDE: Pro Brain Natriuretic Peptide: 1492 pg/mL — ABNORMAL HIGH (ref <300.0)

## 2024-02-13 MED ORDER — IPRATROPIUM-ALBUTEROL 0.5-2.5 (3) MG/3ML IN SOLN
3.0000 mL | Freq: Once | RESPIRATORY_TRACT | Status: AC
  Administered 2024-02-13: 3 mL via RESPIRATORY_TRACT
  Filled 2024-02-13: qty 3

## 2024-02-13 MED ORDER — PREDNISONE 20 MG PO TABS
40.0000 mg | ORAL_TABLET | Freq: Once | ORAL | Status: AC
  Administered 2024-02-13: 40 mg via ORAL
  Filled 2024-02-13: qty 2

## 2024-02-13 MED ORDER — PREDNISONE 20 MG PO TABS: 40.0000 mg | ORAL_TABLET | Freq: Every day | ORAL | 0 refills | Status: AC

## 2024-02-13 NOTE — Discharge Instructions (Addendum)
 You were seen in the ER today for shortness of breath on walking, we noticed that you were wheezing.  Your cardiac enzymes were normal, your chest x-ray was normal, you are in A-fib but your rate is normal.  Follow-up closely with your primary care doctor, use your inhaler or breathing treatment every 4-6 hours as needed, please come back to the ER if you have new or worsening symptoms.  Follow-up closely with your PCP.  Call tomorrow to schedule close outpatient follow-up.

## 2024-02-13 NOTE — ED Provider Notes (Cosign Needed Addendum)
 [place] EMERGENCY DEPARTMENT AT [facility] Provider Note   CSN: [id number] Arrival date & time: 02/13/24  1422     Patient presents with: Shortness of Breath   Isabella Holmes is a 78 y.o. female.  She has history of obesity, GERD, seizure disorder, sleep apnea, COPD, primary biliary cholangitis, atrial fibrillation on Eliquis.  The ER today complaining of approximately 2 days of increased shortness of breath.  It has been intermittent, it is worse when she exerts herself.  She also reports some chest heaviness but denies pain, no back pain, no fevers or chills, no nausea or vomiting.  She reports she has been compliant with her Eliquis.  Denies any dizziness or passing out.  She reports she has not been using her CPAP at night because she has been sleeping in her recliner for the past several weeks due to worsening shortness of breath with laying flat.  Patient had from hospital on 01/23/2024.  She had a liver biopsy and then went into A-fib.  Was found to have a small hemoperitoneum.  Did not need intervention for this, bleeding was minimal and she was able to be started on anticoagulants.  She had some intermittent palpitations since then but states the shortness of breath has gotten worse over the past couple days.  She had echocardiogram on 01/24/2024 that showed EF 60 to 65% mild LVH, normal right systolic function severe left atrial dilatation mild mitral regurg.      Shortness of Breath      Prior to Admission medications   Medication Sig Start Date End Date Taking? Authorizing Provider  albuterol  (PROVENTIL  HFA;VENTOLIN  HFA) 108 (90 Base) MCG/ACT inhaler Inhale 1-2 puffs into the lungs every 4 (four) hours as needed for wheezing or shortness of breath.    [provider]  albuterol  (PROVENTIL ) (2.5 MG/3ML) 0.083% nebulizer solution Take 2.5 mg by nebulization every 6 (six) hours as needed for shortness of breath. 11/29/20   [provider]   apixaban (ELIQUIS) 5 MG TABS tablet Take 5 mg by mouth 2 (two) times daily.    [provider]  Cholecalciferol (VITAMIN D3) 1000 units CAPS Take 1,000 Units by mouth in the morning.    [provider]  famotidine (PEPCID) 20 MG tablet Take 20 mg by mouth in the morning and at bedtime. 11/22/20   [provider]  FLUoxetine  (PROZAC ) 20 MG capsule Take 20 mg by mouth daily. 11/23/20   [provider]  lamoTRIgine  (LAMICTAL ) 150 MG tablet Take 150 mg by mouth at bedtime.    [provider]  levothyroxine  (SYNTHROID , LEVOTHROID) 50 MCG tablet Take 50 mcg by mouth daily before breakfast.  05/19/16   [provider]  losartan  (COZAAR ) 100 MG tablet Take 100 mg by mouth in the morning. 04/10/22   [provider]  metoprolol  succinate (TOPROL -XL) 50 MG 24 hr tablet Take 1 tablet (50 mg total) by mouth daily. Take with or immediately following a meal. 02/05/24 05/05/24  Vicci Rollo SAUNDERS, PA-C  oxyCODONE  (OXY IR/ROXICODONE ) 5 MG immediate release tablet Take 1 tablet (5 mg total) by mouth every 4 (four) hours as needed for moderate pain (pain score 4-6). 01/24/24   Danford, Lonni SQUIBB, MD  rosuvastatin (CRESTOR) 5 MG tablet Take 5 mg by mouth daily. 02/01/24   [provider]  ursodiol  (ACTIGALL ) 500 MG tablet TAKE ONE TABLET BY MOUTH 3 TIMES DAILY WITH FOOD 01/17/24   Carlan, Chelsea L, NP    Allergies:  Penicillins, Atorvastatin , Levofloxacin, Streptomycin, and Sulfa antibiotics    Review of Systems  Respiratory:  Positive for shortness of breath.     Updated Vital Signs BP (!) 147/95   Pulse 68   Temp 98.4 F (36.9 C) (Oral)   Resp 15   Ht 5' 5 (1.651 m)   Wt 107 kg   SpO2 92%   BMI 39.24 kg/m   Physical Exam Vitals and nursing note reviewed.  Constitutional:      General: She is not in acute distress.    Appearance: She is well-developed.  HENT:     Head: Normocephalic and atraumatic.  Eyes:      Conjunctiva/sclera: Conjunctivae normal.     Pupils: Pupils are equal, round, and reactive to light.  Cardiovascular:     Rate and Rhythm: Normal rate and regular rhythm.     Heart sounds: No murmur heard. Pulmonary:     Effort: Pulmonary effort is normal. No respiratory distress.     Breath sounds: Examination of the right-upper field reveals wheezing. Examination of the left-upper field reveals wheezing. Examination of the right-middle field reveals wheezing. Examination of the left-middle field reveals wheezing. Examination of the right-lower field reveals wheezing. Examination of the left-lower field reveals wheezing. Wheezing present.  Abdominal:     Palpations: Abdomen is soft.     Tenderness: There is no abdominal tenderness.  Musculoskeletal:        General: No swelling.     Cervical back: Neck supple.     Right lower leg: No edema.     Left lower leg: No edema.  Skin:    General: Skin is warm and dry.     Capillary Refill: Capillary refill takes less than 2 seconds.  Neurological:     General: No focal deficit present.     Mental Status: She is alert and oriented to person, place, and time.  Psychiatric:        Mood and Affect: Mood normal.     (all labs ordered are listed, but only abnormal results are displayed) Labs Reviewed  PRO BRAIN NATRIURETIC PEPTIDE - Abnormal; Notable for the following components:      Result Value   Pro Brain Natriuretic Peptide 1,492.0 (*)    All other components within normal limits  COMPREHENSIVE METABOLIC PANEL WITH GFR - Abnormal; Notable for the following components:   Creatinine, Ser 1.06 (*)    GFR, Estimated 54 (*)    All other components within normal limits  RESP PANEL BY RT-PCR (RSV, FLU A&B, COVID)  RVPGX2  CBC WITH DIFFERENTIAL/PLATELET    EKG: None  Radiology: DG Chest 2 View Result Date: 02/13/2024 EXAM: 2 VIEW(S) XRAY OF THE CHEST 02/13/2024 02:54:00 PM COMPARISON: Prominent chest x-ray 01/23/2024 , CT 01/23/2024.  CLINICAL HISTORY: SOB FINDINGS: LUNGS AND PLEURA: No focal pulmonary opacity. No pleural effusion. No pneumothorax. HEART AND MEDIASTINUM: Calcified aorta. No acute abnormality of the cardiac and mediastinal silhouettes. BONES AND SOFT TISSUES: Thoracic degenerative changes. Mild T11 wedge compression deformity unchanged from recent CT. No acute osseous abnormality. IMPRESSION: 1. No acute cardiopulmonary process identified. 2. Mild chronic T11 wedge compression deformity, unchanged from recent CT. Electronically signed by: Toribio Agreste MD 02/13/2024 03:38 PM EST RP Workstation: [id number]     Procedures   Medications Ordered in the ED - No data to display  Medical Decision Making This patient presents to the ED for concern of shortness of breath, this involves an extensive number of treatment options, and is a complaint that carries with it a high risk of complications and morbidity.  The differential diagnosis includes COPD, CHF, ACS, PE, pneumonia, acute anemia, other   Co morbidities that complicate the patient evaluation  COPD, A-fib on Eliquis, sleep apnea   Additional history obtained:  Additional history obtained from EMR External records from outside source obtained and reviewed including recent cardiology note, echocardiogram, hospitalization notes   Lab Tests:  I Ordered, and personally interpreted labs.  The pertinent results include: Troponin negative x 2, CMP with baseline renal function, normal electrolytes, BNP 1492-does not suggest acute heart failure based on patient's age, CBC normal, respiratory panel negative   Imaging Studies ordered:  I ordered imaging studies including chest x-ray I independently visualized and interpreted imaging which showed no pulmonary edema or infiltrate I agree with the radiologist interpretation     Problem List / ED Course / Critical interventions / Medication management  Shortness of  breath-patient has A-fib, history of COPD, seizures, GERD.  Coming in for 2 days of increased shortness of breath.  It is worse when she walks.  No chest pain with it.  Troponin negative x 2, BNP does not suggest acute heart failure based on patient's age, chest x-ray is normal.  On exam she was wheezing.  She has not been using her inhalers at home.  She feels much better after DuoNeb and steroids.  Ambulated with pulse ox and did not desaturate.  I considered PE but patient has been on Eliquis and has not missed any doses and I feel this is less likely.  She is not hypoxic or tachycardic or tachypneic and is not having any pleuritic pain.  She feels comfortable going home, I will treat for presumptive COPD exacerbation, advised on close follow-up and return precautions. I ordered medication including DuoNeb for shortness of breath and wheezing Reevaluation of the patient after these medicines showed that the patient resolved I have reviewed the patients home medicines and have made adjustments as needed        Amount and/or Complexity of Data Reviewed Labs: ordered. Radiology: ordered.  Risk Prescription drug management.        Final diagnoses:  None    ED Discharge Orders     None          Suellen Sherran DELENA DEVONNA 02/13/24 2304    Suellen Sherran DELENA DEVONNA 02/13/24 2304    Suzette Pac, MD 02/15/24 [phone]

## 2024-02-13 NOTE — ED Notes (Signed)
 Nurse Tech ambulated pt with pulse ox; O2 saturation maintained 94% with a drop to 93%. Sustained 94% thereafter.

## 2024-02-13 NOTE — ED Triage Notes (Signed)
 Pt arrived via POV c/o SOB for several days. Pt reports this occurred recently as well when she had a biopsy on her liver and reports going into new onset A-Fib. Pt reports recently being started on Eliquis for this.

## 2024-02-14 ENCOUNTER — Telehealth: Payer: Self-pay | Admitting: Cardiology

## 2024-02-14 NOTE — Telephone Encounter (Signed)
 Pt c/o Shortness Of Breath: STAT if SOB developed within the last 24 hours or pt is noticeably SOB on the phone  1. Are you currently SOB (can you hear that pt is SOB on the phone)? Daughter called   2. How long have you been experiencing SOB? Several days   3. Are you SOB when sitting or when up moving around? Moving around   4. Are you currently experiencing any other symptoms? Headaches and nausea, chest heaviness

## 2024-02-14 NOTE — Telephone Encounter (Addendum)
 Spoke to patient's daughter Madelin.She stated her mother continues to have sob,heaviness in chest.She went to [facility] ED yesterday.All test normal except elevated BNP.She was given steroids to take for 4 days Stated she is waiting on cardioversion appointment, to be scheduled the week before Christmas.Daughter reassured.Appointment scheduled with Dr.Nishan 03/20/24 at 11:00 am.I will make Rollo Louder PA aware of call.

## 2024-02-19 ENCOUNTER — Telehealth: Payer: Self-pay | Admitting: Cardiovascular Disease

## 2024-02-19 NOTE — Telephone Encounter (Signed)
 Left message for patient's daughter Madelin informing her per Rollo Louder, PA-C we are waiting until after patient's GI appt on 12/11 to see if any procedures are needed prior to scheduling cardioversion.

## 2024-02-19 NOTE — Telephone Encounter (Signed)
 Pts daughter states her mom has not received a call yet to schedule cardioversion. Daughter Isabella Holmes request she received the scheduling call. Please advise.

## 2024-02-21 ENCOUNTER — Ambulatory Visit (INDEPENDENT_AMBULATORY_CARE_PROVIDER_SITE_OTHER): Admitting: Gastroenterology

## 2024-02-21 ENCOUNTER — Encounter (INDEPENDENT_AMBULATORY_CARE_PROVIDER_SITE_OTHER): Payer: Self-pay | Admitting: Gastroenterology

## 2024-02-21 VITALS — BP 130/83 | HR 89 | Temp 98.1°F | Ht 65.0 in | Wt 238.0 lb

## 2024-02-21 DIAGNOSIS — K759 Inflammatory liver disease, unspecified: Secondary | ICD-10-CM | POA: Diagnosis not present

## 2024-02-21 DIAGNOSIS — K219 Gastro-esophageal reflux disease without esophagitis: Secondary | ICD-10-CM | POA: Diagnosis not present

## 2024-02-21 DIAGNOSIS — L299 Pruritus, unspecified: Secondary | ICD-10-CM | POA: Diagnosis not present

## 2024-02-21 DIAGNOSIS — K743 Primary biliary cirrhosis: Secondary | ICD-10-CM | POA: Diagnosis not present

## 2024-02-21 NOTE — Patient Instructions (Addendum)
 Continue URSO  500 mg TID Continue Protonix  40 mg daily and Pepcid twice daily

## 2024-02-21 NOTE — Progress Notes (Signed)
 Toribio Fortune, M.D. Gastroenterology & Hepatology [facility] [address] [place], [place] [address]  Primary Care Physician: Lari Elspeth BRAVO, MD [address] [place] [address]  I will communicate my assessment and recommendations to the referring MD via EMR.  Problems: PBC on UDCA GERD  History of Present Illness: Isabella Holmes is a 78 y.o. female with past medical history of arthritis, asthma, COPD, dyslipidemia, GERD, atrial fibrillation PBC, seizures, Sleep apnea, coming for follow-up of PBC.  The patient was last seen on 12/03/2023. At that time, the patient was continued on Protonix  40 mg daily and Pepcid twice daily 20 mg.  She was also continued on UDCA 500 mg 3 times daily.  Also was scheduled for liver biopsy which was performed on 01/23/2024.  Results of the biopsy showed focal bile duct injury with associated portal tract chronic  inflammation consistent with the patient's  longstanding clinical history of primary biliary cirrhosis.  There was no presence of bridging fibrosis in the sample. There is  focal evidence of subacute interface hepatitis with portal/periportal  histiocytes.   Liver biopsy was complicated by persistent right upper quadrant pain, with CT scan showing blood extravasation and peritoneal hemorrhage.  She was admitted for observation.  Had new onset of atrial fibrillation, for which she was set up for evaluation with cardiology.  Postprocedural hemorrhage only required supportive management and stopped on its own.  She was discharged home after this.  Most recent blood workup from 02/13/2024 showed AST of 18, ALT 10, alkaline phosphatase 111, total bilirubin 0.6, creatinine 1.06, normal electrolytes, albumin 4.2.  CBC was within normal limits.  Sometimes feels some nausea but no vomiting. The patient denies having any fever, chills, hematochezia, melena, hematemesis, abdominal distention, abdominal pain,  choluria, acholia, diarrhea, jaundice, or weight loss. Has some pruritus in neck every now and then.  -elastography on 11/16/2022 which showed a low K PA of 2.5 but IQR ratio was more than 0.25.  -DEXA scan on 09/26/2022 was -1.1 consistent with osteopenia.  -RUQ US  march 2025 with cirrhotic morphology, cholelithiasis without acute cholecystitis  -ELF done 11/26/23 10.22   Last EGD: 2008  Last Colonoscopy: never, the patient had previously canceled this x 2  Past Medical History: Past Medical History:  Diagnosis Date   Arthritis    Asthma    Chest pain    w abnormal ECg   Chronic headaches    migraine   COPD (chronic obstructive pulmonary disease) (HCC)    Dyslipidemia    Dyspnea    Dyspnea    GERD (gastroesophageal reflux disease)    Glucose intolerance (impaired glucose tolerance)    Hemorrhoids 03/25/2015   Hypothyroidism    Seizure disorder (HCC)    on Lamictal ; has not had seizure in 20 years   Seizures (HCC)    Sleep apnea    lost weight and does not need CPAP any more.   Varicose veins 03/25/2015   Venous insufficiency     Past Surgical History: Past Surgical History:  Procedure Laterality Date   EXAM UNDER ANESTHESIA WITH MANIPULATION OF KNEE Right 04/05/2017   Procedure: EXAM UNDER ANESTHESIA WITH MANIPULATION OF RIGHT KNEE;  Surgeon: Margrette Taft BRAVO, MD;  Location: [facility];  Service: Orthopedics;  Laterality: Right;   KNEE ARTHROSCOPY Left    KNEE ARTHROSCOPY WITH MEDIAL MENISECTOMY Right 06/28/2016   Procedure: KNEE ARTHROSCOPY WITH MEDIAL MENISECTOMY;  Surgeon: Taft BRAVO Margrette, MD;  Location: [facility];  Service: Orthopedics;  Laterality: Right;   left vein stripping     status post bilateral tubaligation     TONSILLECTOMY     many years ago   TOTAL KNEE ARTHROPLASTY Right 02/13/2017   Procedure: TOTAL KNEE ARTHROPLASTY;  Surgeon: Margrette Taft BRAVO, MD;  Location: [facility];  Service: Orthopedics;  Laterality: Right;   TUBAL LIGATION      Family  History: Family History  Problem Relation Age of Onset   Heart attack Mother    Tuberculosis Father    Heart disease Father        cardiac disease   Cirrhosis Father    Factor V Leiden deficiency Other    Seizures Son     Social History:Tobacco Use History[1] Social History   Substance and Sexual Activity  Alcohol Use No   Social History   Substance and Sexual Activity  Drug Use No    Allergies: Allergies[2]  Medications: Current Outpatient Medications  Medication Sig Dispense Refill   albuterol  (PROVENTIL  HFA;VENTOLIN  HFA) 108 (90 Base) MCG/ACT inhaler Inhale 1-2 puffs into the lungs every 4 (four) hours as needed for wheezing or shortness of breath.     albuterol  (PROVENTIL ) (2.5 MG/3ML) 0.083% nebulizer solution Take 2.5 mg by nebulization every 6 (six) hours as needed for shortness of breath.     apixaban (ELIQUIS) 5 MG TABS tablet Take 5 mg by mouth 2 (two) times daily.     Cholecalciferol (VITAMIN D3) 1000 units CAPS Take 1,000 Units by mouth in the morning.     famotidine (PEPCID) 20 MG tablet Take 20 mg by mouth in the morning and at bedtime.     FLUoxetine  (PROZAC ) 20 MG capsule Take 20 mg by mouth daily.     lamoTRIgine  (LAMICTAL ) 150 MG tablet Take 150 mg by mouth at bedtime.     levothyroxine  (SYNTHROID , LEVOTHROID) 50 MCG tablet Take 50 mcg by mouth daily before breakfast.      losartan  (COZAAR ) 100 MG tablet Take 100 mg by mouth in the morning.     metoprolol  succinate (TOPROL -XL) 50 MG 24 hr tablet Take 1 tablet (50 mg total) by mouth daily. Take with or immediately following a meal. 30 tablet 3   oxyCODONE  (OXY IR/ROXICODONE ) 5 MG immediate release tablet Take 1 tablet (5 mg total) by mouth every 4 (four) hours as needed for moderate pain (pain score 4-6). 12 tablet 0   rosuvastatin (CRESTOR) 5 MG tablet Take 5 mg by mouth daily.     ursodiol  (ACTIGALL ) 500 MG tablet TAKE ONE TABLET BY MOUTH 3 TIMES DAILY WITH FOOD 90 tablet 3   No current  facility-administered medications for this visit.    Review of Systems: GENERAL: negative for malaise, night sweats HEENT: No changes in hearing or vision, no nose bleeds or other nasal problems. NECK: Negative for lumps, goiter, pain and significant neck swelling RESPIRATORY: Negative for cough, wheezing CARDIOVASCULAR: Negative for chest pain, leg swelling, palpitations, orthopnea GI: SEE HPI MUSCULOSKELETAL: Negative for joint pain or swelling, back pain, and muscle pain. SKIN: Negative for lesions, rash PSYCH: Negative for sleep disturbance, mood disorder and recent psychosocial stressors. HEMATOLOGY Negative for prolonged bleeding, bruising easily, and swollen nodes. ENDOCRINE: Negative for cold or heat intolerance, polyuria, polydipsia and goiter. NEURO: negative for tremor, gait imbalance, syncope and seizures. The remainder of the review of systems is noncontributory.   Physical Exam: BP 130/83 (BP Location: Left Arm, Patient Position: Sitting, Cuff Size: Large)   Pulse 89  Temp 98.1 F (36.7 C) (Oral)   Ht 5' 5 (1.651 m)   Wt 238 lb (108 kg)   BMI 39.61 kg/m  GENERAL: The patient is AO x3, in no acute distress.  Obese HEENT: Head is normocephalic and atraumatic. EOMI are intact. Mouth is well hydrated and without lesions. NECK: Supple. No masses LUNGS: Clear to auscultation. No presence of rhonchi/wheezing/rales. Adequate chest expansion HEART: RRR, normal s1 and s2. ABDOMEN: Soft, nontender, no guarding, no peritoneal signs, and nondistended. BS +. No masses. EXTREMITIES: Without any cyanosis, clubbing, rash, lesions or edema. NEUROLOGIC: AOx3, no focal motor deficit. SKIN: no jaundice, no rashes  Imaging/Labs: as above  I personally reviewed and interpreted the available labs, imaging and endoscopic files.  Impression and Plan: JAKERRA FLOYD is a 79 y.o. female with past medical history of arthritis, asthma, COPD, dyslipidemia, GERD, PBC, seizures, Sleep  apnea, coming for follow-up of PBC.  The patient has presented adequate tolerance to UDCA for multiple having adequate years and is having minimal symptoms of pruritus.  Most recent blood workup showed biochemical remission as her alkaline phosphatase is at therapeutic goal, as well as her total bilirubin.  As there was some concern for possible cirrhosis given discordant imaging results on recent ultrasound, she underwent a liver biopsy.  There was no presence of any advanced fibrosis in the samples.  I discussed with the patient and daughter that these results were reassuring.  It does not completely rule out some fibrosis as the sampling is only obtained from a part of the liver, but given that her biochemistry is not highly concerning for cirrhosis, we will keep monitoring her case as PBC without cirrhosis.  Will need to continue doing noninvasive monitoring in the future, possibly performing a FibroScan in 2027.  She needs to continue for now on the same doses of UDCA.  Notably, she had some focal interface hepatitis.  As her aminotransferases have been normal, I explained that even if there was a possibility of some autoimmune hepatitis, this will not warrant active treatment at the moment.  If her aminotransferases were to increase, we will need to check IgG levels and consider treatment for AIH at that point.  Finally, her GERD appears to be well-controlled as she denies any complaints.  Will continue with Protonix  daily and OTC Pepcid twice daily.  -Continue URSO  500 mg TID -Continue Protonix  40 mg daily and Pepcid twice daily - If patient were to have elevation of aminotransferases in the future, may need to check IgG levels given presence of possible interface hepatitis in biopsy - Repeat FibroScan in 2027  All questions were answered.      Toribio Fortune, MD Gastroenterology and Hepatology [facility]astroenter[facility]     [1]  Social History Tobacco Use  Smoking Status  Former   Current packs/day: 0.00   Average packs/day: 2.5 packs/day for 20.0 years (50.0 ttl pk-yrs)   Types: Cigarettes   Start date: 06/23/1966   Quit date: 06/23/1986   Years since quitting: 37.6   Passive exposure: Past  Smokeless Tobacco Never  [2]  Allergies Allergen Reactions   Penicillins Itching and Rash    Has patient had a PCN reaction causing immediate rash, facial/tongue/throat swelling, SOB or lightheadedness with hypotension: No Has patient had a PCN reaction causing severe rash involving mucus membranes or skin necrosis: No Has patient had a PCN reaction that required hospitalization No Has patient had a PCN reaction occurring within the last 10 years: No  If all of the above answers are NO, then may proceed with Cephalosporin use.    Atorvastatin  Other (See Comments)    Made my legs hurt.   Levofloxacin Other (See Comments)    Recommended by Dr due to family hx   Streptomycin Rash   Sulfa Antibiotics Itching and Rash

## 2024-03-03 ENCOUNTER — Other Ambulatory Visit (INDEPENDENT_AMBULATORY_CARE_PROVIDER_SITE_OTHER): Payer: Self-pay | Admitting: Gastroenterology

## 2024-03-03 ENCOUNTER — Other Ambulatory Visit (INDEPENDENT_AMBULATORY_CARE_PROVIDER_SITE_OTHER): Payer: Self-pay

## 2024-03-03 ENCOUNTER — Telehealth (INDEPENDENT_AMBULATORY_CARE_PROVIDER_SITE_OTHER): Payer: Self-pay

## 2024-03-03 DIAGNOSIS — K743 Primary biliary cirrhosis: Secondary | ICD-10-CM

## 2024-03-03 MED ORDER — URSODIOL 500 MG PO TABS: 500.0000 mg | ORAL_TABLET | Freq: Three times a day (TID) | ORAL | 3 refills | Status: AC

## 2024-03-03 NOTE — Telephone Encounter (Signed)
 Patient wants her Ursodiol  script sent to [facility]). She says this will save her money on her scripts.

## 2024-03-03 NOTE — Telephone Encounter (Signed)
 Please place this email on the script prior to sending electronically as they do require this. [email or web address].   Thanks,

## 2024-03-03 NOTE — Telephone Encounter (Signed)
 Patient and daughter Amy aware.

## 2024-03-13 ENCOUNTER — Encounter: Payer: Self-pay | Admitting: Gastroenterology

## 2024-03-17 ENCOUNTER — Telehealth: Payer: Self-pay

## 2024-03-17 NOTE — Telephone Encounter (Signed)
-----   Message from Nurse Dorothyann BROCKS, RN sent at 03/17/2024 12:32 PM EST -----  ----- Message ----- From: Delford Maude BROCKS, MD Sent: 03/17/2024  12:19 PM EST To: Reynaldo MATSU Pinnix, LPN; Dorothyann DELENA Koyanagi, #  Patient on my schedule 1/8 Haven't seen in 3 years. Appointment says f/u post DCC but I don't see this was ever done ?

## 2024-03-17 NOTE — Progress Notes (Signed)
 CARDIOLOGY CONSULT NOTE       Patient ID: Isabella Holmes MRN: [id number] DOB/AGE: 1946/02/07 79 y.o.  Referring Physician: Burdine Primary Physician: Lari Elspeth BRAVO, MD Primary Cardiologist: Last seen 3 years ago Reason for Consultation: Afib   HPI:  79 y.o. who I last saw 3 years ago. History of COPD, Arthritis, Asthma. GERD, Hypothyroid and remote seizures. When I saw her in 2022 she had dyspnea ? Diastolic dysfunction with EF at [facility] 65-70% She has seen Dr Theophilus in pulmonary for her COPD/Asthma. Myovue done 03/25/21 was normal with no ischemia and EF 71% Seen by PA Rollo Louder 02/05/24 Patient now has primary biliary cirrhosis and had some peritoneal hemorrhage after US  guided liver biopsy. She also has OSA. Admitted after liver biopsy and noted to have afib. TSH normal CHADVASC 4 Echo done 01/24/24 with normal EF 60-65% mild LVH severe LAE and mild MR Started on eliquis 2nd week of November. Was in afib rates 90's in office.   Liver biopsy consistent with primary biliary cirrhosis LFTls normalized Being Rx with UDCA, protonix  and pepcid  Sees Dr Eartha in GI  ECG in office today afib rae 62 bpm  She missed a dose or two of eliquis last week. Her husband of 50 years is not well and in hospice. Discussed DCC. Shared decision making favor at least one attempt. Risks including CVA, TMP, intubation discussed Daughter /patient willing to proceed   ROS All other systems reviewed and negative except as noted above  Past Medical History:  Diagnosis Date   Arthritis    Asthma    Chest pain    w abnormal ECg   Chronic headaches    migraine   COPD (chronic obstructive pulmonary disease) (HCC)    Dyslipidemia    Dyspnea    Dyspnea    GERD (gastroesophageal reflux disease)    Glucose intolerance (impaired glucose tolerance)    Hemorrhoids 03/25/2015   Hypothyroidism    Seizure disorder (HCC)    on Lamictal ; has not had seizure in 20 years   Seizures (HCC)    Sleep apnea     lost weight and does not need CPAP any more.   Varicose veins 03/25/2015   Venous insufficiency     Family History  Problem Relation Age of Onset   Heart attack Mother    Tuberculosis Father    Heart disease Father        cardiac disease   Cirrhosis Father    Factor V Leiden deficiency Other    Seizures Son     Social History   Socioeconomic History   Marital status: Married    Spouse name: Not on file   Number of children: Not on file   Years of education: Not on file   Highest education level: Not on file  Occupational History   Not on file  Tobacco Use   Smoking status: Former    Current packs/day: 0.00    Average packs/day: 2.5 packs/day for 20.0 years (50.0 ttl pk-yrs)    Types: Cigarettes    Start date: 06/23/1966    Quit date: 06/23/1986    Years since quitting: 37.7    Passive exposure: Past   Smokeless tobacco: Never  Vaping Use   Vaping status: Never Used  Substance and Sexual Activity   Alcohol use: No   Drug use: No   Sexual activity: Yes    Birth control/protection: Post-menopausal  Other Topics Concern   Not on file  Social  History Narrative   Full Time, works at [employer].    Social Drivers of Health   Tobacco Use: Medium Risk (02/21/2024)   Patient History    Smoking Tobacco Use: Former    Smokeless Tobacco Use: Never    Passive Exposure: Past  Physicist, Medical Strain: Not on file  Food Insecurity: No Food Insecurity (01/23/2024)   Epic    Worried About Programme Researcher, Broadcasting/film/video in the Last Year: Never true    Ran Out of Food in the Last Year: Never true  Transportation Needs: No Transportation Needs (01/23/2024)   Epic    Lack of Transportation (Medical): No    Lack of Transportation (Non-Medical): No  Physical Activity: Not on file  Stress: Not on file  Social Connections: Unknown (01/23/2024)   Social Connection and Isolation Panel    Frequency of Communication with Friends and Family: More than three times a week    Frequency of Social  Gatherings with Friends and Family: Three times a week    Attends Religious Services: Patient declined    Active Member of Clubs or Organizations: Patient declined    Attends Banker Meetings: Patient declined    Marital Status: Married  Catering Manager Violence: Not At Risk (01/23/2024)   Epic    Fear of Current or Ex-Partner: No    Emotionally Abused: No    Physically Abused: No    Sexually Abused: No  Depression (PHQ2-9): Not on file  Alcohol Screen: Not on file  Housing: Low Risk (01/23/2024)   Epic    Unable to Pay for Housing in the Last Year: No    Number of Times Moved in the Last Year: 0    Homeless in the Last Year: No  Utilities: Not At Risk (01/23/2024)   Epic    Threatened with loss of utilities: No  Health Literacy: Not on file    Past Surgical History:  Procedure Laterality Date   EXAM UNDER ANESTHESIA WITH MANIPULATION OF KNEE Right 04/05/2017   Procedure: EXAM UNDER ANESTHESIA WITH MANIPULATION OF RIGHT KNEE;  Surgeon: Margrette Taft BRAVO, MD;  Location: [facility];  Service: Orthopedics;  Laterality: Right;   KNEE ARTHROSCOPY Left    KNEE ARTHROSCOPY WITH MEDIAL MENISECTOMY Right 06/28/2016   Procedure: KNEE ARTHROSCOPY WITH MEDIAL MENISECTOMY;  Surgeon: Taft BRAVO Margrette, MD;  Location: [facility];  Service: Orthopedics;  Laterality: Right;   left vein stripping     status post bilateral tubaligation     TONSILLECTOMY     many years ago   TOTAL KNEE ARTHROPLASTY Right 02/13/2017   Procedure: TOTAL KNEE ARTHROPLASTY;  Surgeon: Margrette Taft BRAVO, MD;  Location: [facility];  Service: Orthopedics;  Laterality: Right;   TUBAL LIGATION       Current Medications[1]    Physical Exam: There were no vitals taken for this visit.   Affect appropriate Chronically ill female  HEENT: normal Neck supple with no adenopathy JVP normal no bruits no thyromegaly Lungs clear with no wheezing and good diaphragmatic motion Heart:  S1/S2 no murmur, no rub, gallop or  click PMI normal Abdomen: benighn, BS positve, no tenderness, no AAA no bruit.  No HSM or HJR Distal pulses intact with no bruits No edema Neuro non-focal Skin warm and dry No muscular weakness   Labs:   Lab Results  Component Value Date   WBC 5.4 02/13/2024   HGB 12.9 02/13/2024   HCT 40.6 02/13/2024   MCV 92.1 02/13/2024   PLT 239  02/13/2024   No results for input(s): NA, K, CL, CO2, BUN, CREATININE, CALCIUM , PROT, BILITOT, ALKPHOS, ALT, AST, GLUCOSE in the last 168 hours.  Invalid input(s): LABALBU No results found for: CKTOTAL, CKMB, CKMBINDEX, TROPONINI  Lab Results  Component Value Date   CHOL 114 04/23/2023   Lab Results  Component Value Date   HDL 57 04/23/2023   Lab Results  Component Value Date   LDLCALC 40 04/23/2023   Lab Results  Component Value Date   TRIG 87 04/23/2023   Lab Results  Component Value Date   CHOLHDL 2.0 04/23/2023   No results found for: LDLDIRECT    Radiology: No results found.  EKG: Afib low voltage rate 69 bpm 02/14/24    ASSESSMENT AND PLAN:   Afib:  Noted after liver biopsy in November. DOAC initially delayed due to peritoneal bleed after liver biopsy. Now on eliquis 5 mg bid. Rate control fine on Toprol  50 mg. TSH normal TTE with normal EF but severe LAE. See above schedule [facility] 04/29/24 with me Need to delay an additional 3 weeks for missed eliquis doses  Primary Biliary Cirrhosis:  f/u GI biopsy consistent with PBC. On Actigall , protonix  and pepcid LFTls normal  HTN:  continue ARB and beta blocker HLD:  on statin ok with normal LFTls  Seizures:  distant history on Lamictal  Asthma/COPD: prior smoker no active wheezing Albuterol  inhaler f/u with pulmonary  Pre DCC orders done Scheduled 2/17 at [facility]   F/U afib clinic post [facility]   Signed: Maude Emmer 03/20/2024, 11:04 AM      [1]  Current Outpatient Medications:    albuterol  (PROVENTIL  HFA;VENTOLIN  HFA) 108 (90 Base) MCG/ACT  inhaler, Inhale 1-2 puffs into the lungs every 4 (four) hours as needed for wheezing or shortness of breath., Disp: , Rfl:    albuterol  (PROVENTIL ) (2.5 MG/3ML) 0.083% nebulizer solution, Take 2.5 mg by nebulization every 6 (six) hours as needed for shortness of breath., Disp: , Rfl:    apixaban (ELIQUIS) 5 MG TABS tablet, Take 5 mg by mouth 2 (two) times daily., Disp: , Rfl:    Cholecalciferol (VITAMIN D3) 1000 units CAPS, Take 1,000 Units by mouth in the morning., Disp: , Rfl:    famotidine (PEPCID) 20 MG tablet, Take 20 mg by mouth in the morning and at bedtime., Disp: , Rfl:    FLUoxetine  (PROZAC ) 20 MG capsule, Take 20 mg by mouth daily., Disp: , Rfl:    lamoTRIgine  (LAMICTAL ) 150 MG tablet, Take 150 mg by mouth at bedtime., Disp: , Rfl:    levothyroxine  (SYNTHROID , LEVOTHROID) 50 MCG tablet, Take 50 mcg by mouth daily before breakfast. , Disp: , Rfl:    losartan  (COZAAR ) 100 MG tablet, Take 100 mg by mouth in the morning., Disp: , Rfl:    metoprolol  succinate (TOPROL -XL) 50 MG 24 hr tablet, Take 1 tablet (50 mg total) by mouth daily. Take with or immediately following a meal., Disp: 30 tablet, Rfl: 3   oxyCODONE  (OXY IR/ROXICODONE ) 5 MG immediate release tablet, Take 1 tablet (5 mg total) by mouth every 4 (four) hours as needed for moderate pain (pain score 4-6)., Disp: 12 tablet, Rfl: 0   rosuvastatin (CRESTOR) 5 MG tablet, Take 5 mg by mouth daily., Disp: , Rfl:    ursodiol  (ACTIGALL ) 500 MG tablet, Take 1 tablet (500 mg total) by mouth 3 (three) times daily., Disp: 90 tablet, Rfl: 3

## 2024-03-17 NOTE — Telephone Encounter (Signed)
 Spoke with pt's daughter Madelin and explained that the appt with Dr. Nishan for 1/8 was for a post-DCCV f/u appt and because the pt has not had her DCCV yet, we will cancel the appt for now. Pt's daughter agreed to plan and had no further questions or concerns at this time. Pt's daughter stated that GI said they would be sending us  a letter stating pt is clear for DCCV from their perspective. Explained to pt's daughter that I will forward that information along and we will be in touch with next steps of scheduling DCCV.

## 2024-03-17 NOTE — Telephone Encounter (Signed)
 Spoke with Tammy. Gave recommendations. They will keep appointment with [facility] 03/20/24

## 2024-03-20 ENCOUNTER — Other Ambulatory Visit: Payer: Self-pay | Admitting: Cardiovascular Disease

## 2024-03-20 ENCOUNTER — Ambulatory Visit: Attending: Cardiovascular Disease | Admitting: Cardiovascular Disease

## 2024-03-20 VITALS — BP 116/67 | HR 62 | Ht 66.0 in | Wt 236.0 lb

## 2024-03-20 DIAGNOSIS — I4819 Other persistent atrial fibrillation: Secondary | ICD-10-CM | POA: Diagnosis not present

## 2024-03-20 DIAGNOSIS — K743 Primary biliary cirrhosis: Secondary | ICD-10-CM

## 2024-03-20 DIAGNOSIS — G4733 Obstructive sleep apnea (adult) (pediatric): Secondary | ICD-10-CM

## 2024-03-20 DIAGNOSIS — I10 Essential (primary) hypertension: Secondary | ICD-10-CM

## 2024-03-20 NOTE — Patient Instructions (Signed)
 Medication Instructions:  No Changes (See specific med instructions in preparation for Cardioversion) *If you need a refill on your cardiac medications before your next appointment, please call your pharmacy*  Lab Work: You will need to return to any [facility] to have BMET and CBC drawn sometime the first or second week of February. Non fasting/eat & drink like usual.   Testing/Procedures:    [name]  You are scheduled for a Cardioversion on Tuesday, February 17 with Dr. Maude Emmer.  Please arrive at the [facility] (Main Entrance A) at [facility]: [address] [place], [place] [address] at 6:30 AM (This time is 1 hour(s) before your procedure to ensure your preparation).   Free valet parking service is available. You will check in at ADMITTING.   *Please Note: You will receive a call the day before your procedure to confirm the appointment time. That time may have changed from the original time based on the schedule for that day.*   DIET:  Nothing to eat or drink after midnight except a sip of water with medications (see medication instructions below)  MEDICATION INSTRUCTIONS: !!IF ANY NEW MEDICATIONS ARE STARTED AFTER TODAY, PLEASE NOTIFY YOUR PROVIDER AS SOON AS POSSIBLE!!  FYI: Medications such as Semaglutide (Ozempic, Wegovy), Tirzepatide (Mounjaro, Zepbound), Dulaglutide (Trulicity), etc (GLP1 agonists) AND Canagliflozin (Invokana), Dapagliflozin (Farxiga), Empagliflozin (Jardiance), Ertugliflozin (Steglatro), Bexagliflozin [employer]) or any combination with one of these drugs such as Invokamet (Canagliflozin/Metformin), Synjardy (Empagliflozin/Metformin), etc (SGLT2 inhibitors) must be held around the time of a procedure. This is not a comprehensive list of all of these drugs. Please review all of your medications and talk to your provider if you take any one of these. If you are not sure, ask your provider.    Very Important: Continue taking your  anticoagulant (blood thinner): Apixaban (Eliquis).  You will need to continue this after your procedure until you are told by your provider that it is safe to stop.    LABS: You will need to return to any [facility] to have BMET and CBC drawn sometime the first or second week of February. Non fasting/eat & drink like usual.   FYI:  For your safety, and to allow us  to monitor your vital signs accurately during the surgery/procedure we request: If you have artificial nails, gel coating, SNS etc, please have those removed prior to your surgery/procedure. Not having the nail coverings /polish removed may result in cancellation or delay of your surgery/procedure.  Your support person will be asked to wait in the waiting room during your procedure.  It is OK to have someone drop you off and come back when you are ready to be discharged.  You cannot drive after the procedure and will need someone to drive you home.  Bring your insurance cards.  *Special Note: Every effort is made to have your procedure done on time. Occasionally there are emergencies that occur at the hospital that may cause delays. Please be patient if a delay does occur.      Follow-Up: At [facility], you and your health needs are our priority.  As part of our continuing mission to provide you with exceptional heart care, our providers are all part of one team.  This team includes your primary Cardiologist (physician) and Advanced Practice Providers or APPs (Physician Assistants and Nurse Practitioners) who all work together to provide you with the care you need, when you need it.  Your next appointment:    05/13/2024  at 11:30 am  Provider:   Jackee Wyn Raddle., NP with [facility] (on [address] at [address] [place], [place])

## 2024-04-29 ENCOUNTER — Encounter (HOSPITAL_COMMUNITY): Admission: RE | Payer: Self-pay | Source: Home / Self Care

## 2024-04-29 ENCOUNTER — Ambulatory Visit (HOSPITAL_COMMUNITY): Admission: RE | Admit: 2024-04-29 | Admitting: Cardiovascular Disease

## 2024-04-29 SURGERY — CARDIOVERSION (CATH LAB)
Anesthesia: Monitor Anesthesia Care

## 2024-05-13 ENCOUNTER — Ambulatory Visit (HOSPITAL_COMMUNITY): Admitting: Nurse Practitioner
